# Patient Record
Sex: Female | Born: 1995 | Race: White | Hispanic: No | Marital: Single | Smoking: Never smoker
Health system: Southern US, Community
[De-identification: ages and names within clinical notes are randomized; demographics above are authoritative.]

## PROBLEM LIST (undated history)

## (undated) ENCOUNTER — Inpatient Hospital Stay (HOSPITAL_COMMUNITY): Payer: Self-pay

## (undated) ENCOUNTER — Ambulatory Visit: Admission: EM | Payer: BC Managed Care – PPO

## (undated) DIAGNOSIS — A749 Chlamydial infection, unspecified: Secondary | ICD-10-CM

## (undated) DIAGNOSIS — D649 Anemia, unspecified: Secondary | ICD-10-CM

## (undated) DIAGNOSIS — A549 Gonococcal infection, unspecified: Secondary | ICD-10-CM

## (undated) DIAGNOSIS — I10 Essential (primary) hypertension: Secondary | ICD-10-CM

## (undated) DIAGNOSIS — O139 Gestational [pregnancy-induced] hypertension without significant proteinuria, unspecified trimester: Secondary | ICD-10-CM

## (undated) HISTORY — DX: Anemia, unspecified: D64.9

## (undated) HISTORY — DX: Gonococcal infection, unspecified: A54.9

## (undated) HISTORY — DX: Chlamydial infection, unspecified: A74.9

## (undated) HISTORY — DX: Essential (primary) hypertension: I10

## (undated) HISTORY — DX: Gestational (pregnancy-induced) hypertension without significant proteinuria, unspecified trimester: O13.9

## (undated) HISTORY — PX: ECTOPIC PREGNANCY SURGERY: SHX613

## (undated) HISTORY — PX: NO PAST SURGERIES: SHX2092

---

## 2017-01-09 DIAGNOSIS — O139 Gestational [pregnancy-induced] hypertension without significant proteinuria, unspecified trimester: Secondary | ICD-10-CM

## 2017-05-12 ENCOUNTER — Emergency Department (HOSPITAL_COMMUNITY): Payer: Medicaid Other

## 2017-05-12 ENCOUNTER — Emergency Department (HOSPITAL_COMMUNITY)
Admission: EM | Admit: 2017-05-12 | Discharge: 2017-05-13 | Disposition: A | Discharge status: Home / Self Care | Payer: Medicaid Other | Attending: Emergency Medicine | Admitting: Emergency Medicine

## 2017-05-12 ENCOUNTER — Encounter (HOSPITAL_COMMUNITY): Payer: Self-pay | Admitting: Emergency Medicine

## 2017-05-12 DIAGNOSIS — N730 Acute parametritis and pelvic cellulitis: Secondary | ICD-10-CM | POA: Insufficient documentation

## 2017-05-12 DIAGNOSIS — F17290 Nicotine dependence, other tobacco product, uncomplicated: Secondary | ICD-10-CM | POA: Diagnosis not present

## 2017-05-12 DIAGNOSIS — R1030 Lower abdominal pain, unspecified: Secondary | ICD-10-CM | POA: Diagnosis present

## 2017-05-12 DIAGNOSIS — R109 Unspecified abdominal pain: Secondary | ICD-10-CM

## 2017-05-12 DIAGNOSIS — N83202 Unspecified ovarian cyst, left side: Secondary | ICD-10-CM | POA: Insufficient documentation

## 2017-05-12 LAB — COMPREHENSIVE METABOLIC PANEL
ALBUMIN: 4 g/dL (ref 3.5–5.0)
ALK PHOS: 96 U/L (ref 38–126)
ALT: 23 U/L (ref 14–54)
ANION GAP: 9 (ref 5–15)
AST: 19 U/L (ref 15–41)
BILIRUBIN TOTAL: 0.4 mg/dL (ref 0.3–1.2)
BUN: 13 mg/dL (ref 6–20)
CALCIUM: 9.2 mg/dL (ref 8.9–10.3)
CO2: 23 mmol/L (ref 22–32)
CREATININE: 0.63 mg/dL (ref 0.44–1.00)
Chloride: 104 mmol/L (ref 101–111)
GFR calc non Af Amer: >60 mL/min (ref >60)
GLUCOSE: 104 mg/dL — AB (ref 65–99)
POTASSIUM: 3.8 mmol/L (ref 3.5–5.1)
Sodium: 136 mmol/L (ref 135–145)
Total Protein: 7.9 g/dL (ref 6.5–8.1)

## 2017-05-12 LAB — WET PREP, GENITAL
CLUE CELLS WET PREP: NONE SEEN
Sperm: NONE SEEN
Trich, Wet Prep: NONE SEEN
Yeast Wet Prep HPF POC: NONE SEEN

## 2017-05-12 LAB — GC/CHLAMYDIA PROBE AMP (~~LOC~~) NOT AT ARMC
CHLAMYDIA, DNA PROBE: POSITIVE — AB
NEISSERIA GONORRHEA: NEGATIVE

## 2017-05-12 LAB — I-STAT BETA HCG BLOOD, ED (MC, WL, AP ONLY): I-stat hCG, quantitative: <5 m[IU]/mL (ref <5)

## 2017-05-12 LAB — URINALYSIS, ROUTINE W REFLEX MICROSCOPIC
Bilirubin Urine: NEGATIVE
GLUCOSE, UA: NEGATIVE mg/dL
Hgb urine dipstick: NEGATIVE
Ketones, ur: NEGATIVE mg/dL
Nitrite: NEGATIVE
PH: 5 (ref 5.0–8.0)
PROTEIN: NEGATIVE mg/dL
Specific Gravity, Urine: 1.019 (ref 1.005–1.030)

## 2017-05-12 LAB — CBC
HCT: 37.2 % (ref 36.0–46.0)
Hemoglobin: 12.5 g/dL (ref 12.0–15.0)
MCH: 28.7 pg (ref 26.0–34.0)
MCHC: 33.6 g/dL (ref 30.0–36.0)
MCV: 85.3 fL (ref 78.0–100.0)
PLATELETS: 297 10*3/uL (ref 150–400)
RBC: 4.36 MIL/uL (ref 3.87–5.11)
RDW: 12.4 % (ref 11.5–15.5)
WBC: 12.8 10*3/uL — ABNORMAL HIGH (ref 4.0–10.5)

## 2017-05-12 LAB — LIPASE, BLOOD: Lipase: 23 U/L (ref 11–51)

## 2017-05-12 MED ORDER — SODIUM CHLORIDE 0.9 % IV BOLUS (SEPSIS)
1000.0000 mL | Freq: Once | INTRAVENOUS | Status: AC
  Administered 2017-05-12: 1000 mL via INTRAVENOUS

## 2017-05-12 MED ORDER — CEFTRIAXONE SODIUM 250 MG IJ SOLR
250.0000 mg | Freq: Once | INTRAMUSCULAR | Status: AC
  Administered 2017-05-12: 250 mg via INTRAMUSCULAR
  Filled 2017-05-12: qty 250

## 2017-05-12 MED ORDER — AZITHROMYCIN 250 MG PO TABS
1000.0000 mg | ORAL_TABLET | Freq: Once | ORAL | Status: AC
  Administered 2017-05-12: 1000 mg via ORAL
  Filled 2017-05-12: qty 4

## 2017-05-12 MED ORDER — DOXYCYCLINE HYCLATE 100 MG PO CAPS: 100.0000 mg | ORAL_CAPSULE | Freq: Two times a day (BID) | ORAL | 0 refills | Status: DC

## 2017-05-12 MED ORDER — OXYCODONE-ACETAMINOPHEN 5-325 MG PO TABS
1.0000 | ORAL_TABLET | Freq: Once | ORAL | Status: AC
  Administered 2017-05-12: 1 via ORAL
  Filled 2017-05-12: qty 1

## 2017-05-12 NOTE — ED Triage Notes (Signed)
Pt presents to ED for assessment of lower right and left abdominal pain, diarrhea and general malaise for 3 days.  Several small children in the house, as well as diarrhea spreading amongst the adults this week.  Pt had a recent pregnancy 4 months ago.  Pt denies vomiting, but c/o intermittent nausea (denies at this time).  Pt tachycardic at triage.

## 2017-05-12 NOTE — ED Provider Notes (Signed)
[facility] EMERGENCY DEPARTMENT Provider Note   CSN: [id number] Arrival date & time: 05/12/17  0017     History   Chief Complaint Chief Complaint  Patient presents with  . Abdominal Pain  . Diarrhea    HPI Carla Rose is a 21 y.o. female.  HPI  This is a 21 year old female who presents with reported history of worsening lower abdominal pain. Patient reports lower abdominal cramping and discomfort. Currently she rates her pain 8 out of 10. It sometimes lateralizes to the right. She has history of UTIs. She denies dysuria or hematuria. She does endorse diarrhea. No nausea or vomiting. No recent fevers. When asked about concerns for STDs, patient states that "I know my boyfriend has chlamydia." She denies any vaginal discharge or symptoms.  History reviewed. No pertinent past medical history.  There are no active problems to display for this patient.   History reviewed. No pertinent surgical history.  OB History    No data available       Home Medications    Prior to Admission medications   Medication Sig Start Date End Date Taking? Authorizing Provider  doxycycline (VIBRAMYCIN) 100 MG capsule Take 1 capsule (100 mg total) by mouth 2 (two) times daily. 05/12/17   Horton, Mayer Masker, MD    Family History History reviewed. No pertinent family history.  Social History Social History  Substance Use Topics  . Smoking status: Current Every Day Smoker    Packs/day: 5.00    Types: Cigars  . Smokeless tobacco: Never Used  . Alcohol use No     Allergies   Patient has no known allergies.   Review of Systems Review of Systems  Constitutional: Negative for fever.  Respiratory: Negative for shortness of breath.   Cardiovascular: Negative for chest pain.  Gastrointestinal: Positive for abdominal pain and diarrhea. Negative for nausea and vomiting.  Genitourinary: Negative for dysuria and vaginal discharge.  Musculoskeletal: Negative for back  pain.  All other systems reviewed and are negative.    Physical Exam Updated Vital Signs BP 109/71   Pulse (!) 109   Temp 99.2 F (37.3 C) (Oral)   Resp 20   Ht  (1.727 m)   Wt 72.6 kg (160 lb)   SpO2 98%   BMI 24.33 kg/m   Physical Exam  Constitutional: She is oriented to person, place, and time. She appears well-developed and well-nourished. No distress.  HENT:  Head: Normocephalic and atraumatic.  Cardiovascular: Regular rhythm and normal heart sounds.   tachycardia  Pulmonary/Chest: Effort normal. No respiratory distress. She has no wheezes.  Abdominal: Soft. Bowel sounds are normal. She exhibits no mass. There is tenderness. There is no guarding.  Tenderness palpation mostly right lower quadrant and suprapubic area, no rebound or guarding  Genitourinary: Vagina normal.  Genitourinary Comments: No cervical friability noted, there is copious vaginal discharge and tenderness to palpation over the right adnexa  Neurological: She is alert and oriented to person, place, and time.  Skin: Skin is warm and dry.  Psychiatric: She has a normal mood and affect.  Nursing note and vitals reviewed.    ED Treatments / Results  Labs (all labs ordered are listed, but only abnormal results are displayed) Labs Reviewed  WET PREP, GENITAL - Abnormal; Notable for the following:       Result Value   WBC, Wet Prep HPF POC MANY (*)    All other components within normal limits  COMPREHENSIVE METABOLIC PANEL - Abnormal;  Notable for the following:    Glucose, Bld 104 (*)    All other components within normal limits  CBC - Abnormal; Notable for the following:    WBC 12.8 (*)    All other components within normal limits  URINALYSIS, ROUTINE W REFLEX MICROSCOPIC - Abnormal; Notable for the following:    APPearance CLOUDY (*)    Leukocytes, UA LARGE (*)    Bacteria, UA RARE (*)    Squamous Epithelial / LPF 6-30 (*)    Non Squamous Epithelial 0-5 (*)    All other components within  normal limits  URINE CULTURE  LIPASE, BLOOD  I-STAT BETA HCG BLOOD, ED ([facility], [facility], [facility] ONLY)  GC/CHLAMYDIA PROBE AMP (Adrian) NOT AT [facility]    EKG  EKG Interpretation  Date/Time:  Tuesday May 12 2017 00:27:00 EDT Ventricular Rate:  137 PR Interval:  104 QRS Duration: 76 QT Interval:  372 QTC Calculation: 561 R Axis:   105 Text Interpretation:  Critical Test Result: Long QTc Sinus tachycardia with short PR Rightward axis Nonspecific T wave abnormality Abnormal ECG No prior for comparison Confirmed by Ross Marcus ([id number]) on 05/12/2017 3:44:44 AM       Radiology US Pelvic Doppler (torsion R/o Or Mass Arterial Flow)  Result Date: 05/12/2017 CLINICAL DATA:  Initial evaluation for acute pelvic pain for 3 days. EXAM: TRANSABDOMINAL AND TRANSVAGINAL ULTRASOUND OF PELVIS DOPPLER ULTRASOUND OF OVARIES TECHNIQUE: Both transabdominal and transvaginal ultrasound examinations of the pelvis were performed. Transabdominal technique was performed for global imaging of the pelvis including uterus, ovaries, adnexal regions, and pelvic cul-de-sac. It was necessary to proceed with endovaginal exam following the transabdominal exam to visualize the uterus and ovaries. Color and duplex Doppler ultrasound was utilized to evaluate blood flow to the ovaries. COMPARISON:  None. FINDINGS: Uterus Measurements: 8.1 x 4.9 x 5.3 cm. No fibroids or other mass visualized. Endometrium Thickness: 5.1 mm.  No focal abnormality visualized. Right ovary Measurements: 3.8 x 2.1 x 2.6 cm. Normal appearance/no adnexal mass. Left ovary Measurements: 5.6 x 4.1 x 5.8 cm. Mildly complex cyst measuring 4.2 x 3.2 x 4.6 cm, most likely a hemorrhagic cyst or possibly a cyst with internal debris. No internal vascularity or concerning features. Pulsed Doppler evaluation of both ovaries demonstrates normal low-resistance arterial and venous waveforms. Other findings Small volume free fluid within the pelvis. IMPRESSION: 1. 4.2 x 3.2 x  4.6 cm complex left ovarian cyst, which may reflect a hemorrhagic cyst or cyst with internal debris parent while this is normal survey benign, a short interval follow-up study in 6-12 weeks to ensure resolution is suggested. 2. Associated small volume free fluid within the pelvis. 3. Otherwise unremarkable pelvic ultrasound. No evidence for ovarian torsion. Electronically Signed   By: Rise Mu M.D.   On: 05/12/2017 06:47   US Pelvic Complete With Transvaginal  Result Date: 05/12/2017 CLINICAL DATA:  Initial evaluation for acute pelvic pain for 3 days. EXAM: TRANSABDOMINAL AND TRANSVAGINAL ULTRASOUND OF PELVIS DOPPLER ULTRASOUND OF OVARIES TECHNIQUE: Both transabdominal and transvaginal ultrasound examinations of the pelvis were performed. Transabdominal technique was performed for global imaging of the pelvis including uterus, ovaries, adnexal regions, and pelvic cul-de-sac. It was necessary to proceed with endovaginal exam following the transabdominal exam to visualize the uterus and ovaries. Color and duplex Doppler ultrasound was utilized to evaluate blood flow to the ovaries. COMPARISON:  None. FINDINGS: Uterus Measurements: 8.1 x 4.9 x 5.3 cm. No fibroids or other mass visualized. Endometrium Thickness: 5.1 mm.  No focal abnormality visualized. Right ovary Measurements: 3.8 x 2.1 x 2.6 cm. Normal appearance/no adnexal mass. Left ovary Measurements: 5.6 x 4.1 x 5.8 cm. Mildly complex cyst measuring 4.2 x 3.2 x 4.6 cm, most likely a hemorrhagic cyst or possibly a cyst with internal debris. No internal vascularity or concerning features. Pulsed Doppler evaluation of both ovaries demonstrates normal low-resistance arterial and venous waveforms. Other findings Small volume free fluid within the pelvis. IMPRESSION: 1. 4.2 x 3.2 x 4.6 cm complex left ovarian cyst, which may reflect a hemorrhagic cyst or cyst with internal debris parent while this is normal survey benign, a short interval follow-up  study in 6-12 weeks to ensure resolution is suggested. 2. Associated small volume free fluid within the pelvis. 3. Otherwise unremarkable pelvic ultrasound. No evidence for ovarian torsion. Electronically Signed   By: Rise Mu M.D.   On: 05/12/2017 06:47    Procedures Procedures (including critical care time)  Medications Ordered in ED Medications  oxyCODONE-acetaminophen (PERCOCET/ROXICET) 5-325 MG per tablet 1 tablet (1 tablet Oral Given 05/12/17 0413)  cefTRIAXone (ROCEPHIN) injection 250 mg (250 mg Intramuscular Given 05/12/17 0413)  azithromycin (ZITHROMAX) tablet 1,000 mg (1,000 mg Oral Given 05/12/17 0413)  sodium chloride 0.9 % bolus 1,000 mL (0 mLs Intravenous Stopped 05/12/17 0534)     Initial Impression / Assessment and Plan / ED Course  I have reviewed the triage vital signs and the nursing notes.  Pertinent labs & imaging results that were available during my care of the patient were reviewed by me and considered in my medical decision making (see chart for details).     Patient presents with several days of lower abdominal pain and diarrhea. She also reports no exposure to chlamydia but denies any vaginal discharge. She has copious discharge on exam and right adnexal tenderness. Pelvic ultrasound was obtained. Basic labwork was obtained and is otherwise fairly reassuring. Mild leukocytosis. Patient was tested and treated for STDs. She was also given fluids. Initial heart rate was in the 110s. Patient is unsure what her baseline is. She is afebrile. Pelvic ultrasound shows no evidence of torsion. She does have a left ovarian cyst. This is on the opposite side of her pain. No evidence of tubo-ovarian abscess. Would elect to treat for PID. Symptoms are not consistent with appendicitis and have low suspicion at this time for that. Will not proceed with further imaging. Will discharge home with doxycycline.  After history, exam, and medical workup I feel the patient has  been appropriately medically screened and is safe for discharge home. Pertinent diagnoses were discussed with the patient. Patient was given return precautions.    Final Clinical Impressions(s) / ED Diagnoses   Final diagnoses:  Abdominal pain  PID (acute pelvic inflammatory disease)  Left ovarian cyst    New Prescriptions New Prescriptions   DOXYCYCLINE (VIBRAMYCIN) 100 MG CAPSULE    Take 1 capsule (100 mg total) by mouth 2 (two) times daily.     Shon Baton, MD 05/12/17 [phone]

## 2017-05-12 NOTE — Discharge Instructions (Signed)
You were seen today for abdominal pain. You're tested and treated for STDs and will be treated for pelvic inflammatory disease. Abstain from sexual activity for the next 10 days. Make sure to always use protection. During her workup you're found to have a left ovarian cyst. You need to repeat ultrasound in 6-12 weeks to evaluate for resolution. If you develop worsening pain or any new worsening symptoms you need to be reevaluated.

## 2017-05-13 LAB — URINE CULTURE

## 2017-07-28 NOTE — L&D Delivery Note (Signed)
[name] is a 22 y.o. female [phone] with IUP at 4650w1d admitted for spontaneous onset of labor.  She progressed without augmentation to complete and pushed 1 time to deliver.  Cord clamping delayed by several minutes then clamped by CNM and cut by FOB.    Delivery Note At 12:10 AM a viable female was delivered via Vaginal, Spontaneous (Presentation: LOA).  APGAR: 8, 9; weight pending.   Placenta status: spontaneous, intact.  Cord: 3 vessels  Anesthesia:  epidural Episiotomy: None Lacerations: None Suture Repair: n/a Est. Blood Loss (mL): 100  Mom to postpartum.  Baby to Couplet care / Skin to Skin.  Carla Rose CNM 07/19/2018, 12:17 AM

## 2017-12-16 LAB — OB RESULTS CONSOLE ABO/RH: RH Type: POSITIVE

## 2017-12-16 LAB — OB RESULTS CONSOLE GC/CHLAMYDIA
CHLAMYDIA, DNA PROBE: POSITIVE
Gonorrhea: NEGATIVE

## 2017-12-16 LAB — OB RESULTS CONSOLE RUBELLA ANTIBODY, IGM: Rubella: IMMUNE

## 2017-12-16 LAB — OB RESULTS CONSOLE ANTIBODY SCREEN: ANTIBODY SCREEN: NEGATIVE

## 2017-12-16 LAB — OB RESULTS CONSOLE RPR: RPR: NONREACTIVE

## 2018-01-13 LAB — OB RESULTS CONSOLE HIV ANTIBODY (ROUTINE TESTING): HIV: NONREACTIVE

## 2018-01-13 LAB — OB RESULTS CONSOLE HEPATITIS B SURFACE ANTIGEN: Hepatitis B Surface Ag: NEGATIVE

## 2018-01-31 ENCOUNTER — Emergency Department (HOSPITAL_COMMUNITY)
Admission: EM | Admit: 2018-01-31 | Discharge: 2018-01-31 | Disposition: A | Discharge status: Home / Self Care | Payer: Medicaid Other | Attending: Emergency Medicine | Admitting: Emergency Medicine

## 2018-01-31 ENCOUNTER — Encounter (HOSPITAL_COMMUNITY): Payer: Self-pay | Admitting: Emergency Medicine

## 2018-01-31 ENCOUNTER — Other Ambulatory Visit: Payer: Self-pay

## 2018-01-31 DIAGNOSIS — Z3A20 20 weeks gestation of pregnancy: Secondary | ICD-10-CM | POA: Diagnosis not present

## 2018-01-31 DIAGNOSIS — Z87891 Personal history of nicotine dependence: Secondary | ICD-10-CM | POA: Diagnosis not present

## 2018-01-31 DIAGNOSIS — N3000 Acute cystitis without hematuria: Secondary | ICD-10-CM

## 2018-01-31 DIAGNOSIS — O2342 Unspecified infection of urinary tract in pregnancy, second trimester: Secondary | ICD-10-CM | POA: Diagnosis present

## 2018-01-31 LAB — URINALYSIS, ROUTINE W REFLEX MICROSCOPIC
Bilirubin Urine: NEGATIVE
Glucose, UA: NEGATIVE mg/dL
Hgb urine dipstick: NEGATIVE
Ketones, ur: NEGATIVE mg/dL
NITRITE: NEGATIVE
Protein, ur: 30 mg/dL — AB
SPECIFIC GRAVITY, URINE: 1.03 (ref 1.005–1.030)
pH: 5 (ref 5.0–8.0)

## 2018-01-31 MED ORDER — NITROFURANTOIN MONOHYD MACRO 100 MG PO CAPS
100.0000 mg | ORAL_CAPSULE | Freq: Once | ORAL | Status: AC
  Administered 2018-01-31: 100 mg via ORAL
  Filled 2018-01-31: qty 1

## 2018-01-31 MED ORDER — NITROFURANTOIN MONOHYD MACRO 100 MG PO CAPS: 100.0000 mg | ORAL_CAPSULE | Freq: Two times a day (BID) | ORAL | 0 refills | Status: DC

## 2018-01-31 NOTE — Discharge Instructions (Addendum)
Follow up with an OB/GYN doctor, Dr. Emelda FearFerguson and [facility].  Return as needed for worsening symptoms

## 2018-01-31 NOTE — ED Triage Notes (Signed)
Lower abd pain that radiates to rt hip x 1 month.  Pt is 3 months preg.  Denies any vaginal bleeding.  States pain is worse when she moves. Reports she was recently tx for chlamydia.

## 2018-01-31 NOTE — ED Provider Notes (Signed)
[facility] EMERGENCY DEPARTMENT Provider Note   CSN: [id number] Arrival date & time: 01/31/18  1332     History   Chief Complaint Chief Complaint  Patient presents with  . Abdominal Pain    HPI Carla Rose is a 22 y.o. female.  HPI Patient is G3, P2 at [redacted] weeks EGA who presents with intermittent lower abdominal pain for the last month.  Patient states she has had some pain in her right lower quadrant.  The symptoms come and go.  She was diagnosed with an intrauterine pregnancy when [place]  Patient states she recently moved to this area she does not have an OB doctor.  She was concerned she might be having issues with possible urinary tract infection.  She denies any nausea vomiting.  No vaginal discharge or bleeding. History reviewed. No pertinent past medical history.  There are no active problems to display for this patient.   History reviewed. No pertinent surgical history.   OB History   None      Home Medications    Prior to Admission medications   Medication Sig Start Date End Date Taking? Authorizing Provider  nitrofurantoin, macrocrystal-monohydrate, (MACROBID) 100 MG capsule Take 1 capsule (100 mg total) by mouth 2 (two) times daily. 01/31/18   Linwood DibblesKnapp, Crixus Mcaulay, MD    Family History No family history on file.  Social History Social History   Tobacco Use  . Smoking status: Former Smoker    Packs/day: 5.00    Types: Cigars  . Smokeless tobacco: Never Used  Substance Use Topics  . Alcohol use: No  . Drug use: No     Allergies   Patient has no known allergies.   Review of Systems Review of Systems  All other systems reviewed and are negative.    Physical Exam Updated Vital Signs BP 97/70 (BP Location: Left Arm)   Pulse 93   Temp 98.3 F (36.8 C) (Oral)   Resp 16   Ht 1.727 m (5\' 8" )   Wt 75.8 kg (167 lb)   SpO2 99%   BMI 25.39 kg/m   Physical Exam  Constitutional: She appears well-developed and  well-nourished. No distress.  HENT:  Head: Normocephalic and atraumatic.  Right Ear: External ear normal.  Left Ear: External ear normal.  Eyes: Conjunctivae are normal. Right eye exhibits no discharge. Left eye exhibits no discharge. No scleral icterus.  Neck: Neck supple. No tracheal deviation present.  Cardiovascular: Normal rate, regular rhythm and intact distal pulses.  Pulmonary/Chest: Effort normal and breath sounds normal. No stridor. No respiratory distress. She has no wheezes. She has no rales.  Abdominal: Soft. Bowel sounds are normal. She exhibits no distension. There is no tenderness. There is no rebound and no guarding.  Genitourinary: Pelvic exam was performed with patient supine. There is no rash, tenderness, lesion or injury on the right labia. There is no rash, tenderness, lesion or injury on the left labia. Uterus is enlarged. Uterus is not tender. Cervix exhibits no motion tenderness, no discharge and no friability. Right adnexum displays no mass, no tenderness and no fullness. Left adnexum displays no mass, no tenderness and no fullness. No erythema or tenderness in the vagina. No foreign body in the vagina. No signs of injury around the vagina. No vaginal discharge found.  Musculoskeletal: She exhibits no edema or tenderness.  Neurological: She is alert. She has normal strength. No cranial nerve deficit (no facial droop, extraocular movements intact, no slurred speech)  or sensory deficit. She exhibits normal muscle tone. She displays no seizure activity. Coordination normal.  Skin: Skin is warm and dry. No rash noted.  Psychiatric: She has a normal mood and affect.  Nursing note and vitals reviewed.    ED Treatments / Results  Labs (all labs ordered are listed, but only abnormal results are displayed) Labs Reviewed  URINALYSIS, ROUTINE W REFLEX MICROSCOPIC - Abnormal; Notable for the following components:      Result Value   Color, Urine AMBER (*)    APPearance CLOUDY  (*)    Protein, ur 30 (*)    Leukocytes, UA SMALL (*)    Bacteria, UA RARE (*)    All other components within normal limits  URINE CULTURE  RPR  HIV ANTIBODY (ROUTINE TESTING)  GC/CHLAMYDIA PROBE AMP (Henriette) NOT AT [facility]    EKG None  Radiology No results found.  Procedures Procedures (including critical care time)  Medications Ordered in ED Medications  nitrofurantoin (macrocrystal-monohydrate) (MACROBID) capsule 100 mg (has no administration in time range)     Initial Impression / Assessment and Plan / ED Course  I have reviewed the triage vital signs and the nursing notes.  Pertinent labs & imaging results that were available during my care of the patient were reviewed by me and considered in my medical decision making (see chart for details).  Clinical Course as of Jan 31 1541  Wynelle Link Jan 31, 2018  1430 Records reviewed from her visit at [facility].  Patient had emergency room visit on May 22.  Patient had a confirmed IUP by ultrasound.   LMP 09/13/2017 (exact date   [JK]  1536 Repeat heart rate normal.  Pt was not tachycardic on my exam.  ?accuracy of initial heart rate   [JK]    Clinical Course User Index [JK] Linwood Dibbles, MD    Patient presented to the emergency room for evaluation of intermittent right lower quadrant abdominal pain.  Patient had no abdominal tenderness on exam.  She has not had any vaginal bleeding.  Patient is G3, P2 at 20 weeks.  Patient had normal fetal heart tones.   Pelvic exam normal.  UA does suggest uti.  Will dc home on oral abx.  Outpatient follow up with OB GYN  Final Clinical Impressions(s) / ED Diagnoses   Final diagnoses:  Acute cystitis without hematuria    ED Discharge Orders        Ordered    nitrofurantoin, macrocrystal-monohydrate, (MACROBID) 100 MG capsule  2 times daily     01/31/18 1537       Linwood Dibbles, MD 01/31/18 1542

## 2018-02-01 LAB — GC/CHLAMYDIA PROBE AMP (~~LOC~~) NOT AT ARMC
Chlamydia: POSITIVE — AB
Neisseria Gonorrhea: NEGATIVE

## 2018-02-01 LAB — HIV ANTIBODY (ROUTINE TESTING W REFLEX): HIV Screen 4th Generation wRfx: NONREACTIVE

## 2018-02-01 LAB — RPR: RPR Ser Ql: NONREACTIVE

## 2018-02-02 LAB — URINE CULTURE

## 2018-02-23 ENCOUNTER — Encounter: Payer: Self-pay | Admitting: *Deleted

## 2018-02-25 ENCOUNTER — Ambulatory Visit (INDEPENDENT_AMBULATORY_CARE_PROVIDER_SITE_OTHER): Payer: Self-pay | Admitting: Women's Health

## 2018-02-25 ENCOUNTER — Encounter: Payer: Self-pay | Admitting: Women's Health

## 2018-02-25 ENCOUNTER — Other Ambulatory Visit (HOSPITAL_COMMUNITY)
Admission: RE | Admit: 2018-02-25 | Discharge: 2018-02-25 | Disposition: A | Discharge status: Home / Self Care | Payer: Medicaid Other | Source: Ambulatory Visit | Attending: Obstetrics & Gynecology | Admitting: Obstetrics & Gynecology

## 2018-02-25 VITALS — BP 114/70 | HR 109 | Wt 174.0 lb

## 2018-02-25 DIAGNOSIS — Z124 Encounter for screening for malignant neoplasm of cervix: Secondary | ICD-10-CM | POA: Diagnosis not present

## 2018-02-25 DIAGNOSIS — Z3482 Encounter for supervision of other normal pregnancy, second trimester: Secondary | ICD-10-CM

## 2018-02-25 DIAGNOSIS — Z3A18 18 weeks gestation of pregnancy: Secondary | ICD-10-CM | POA: Insufficient documentation

## 2018-02-25 DIAGNOSIS — Z8619 Personal history of other infectious and parasitic diseases: Secondary | ICD-10-CM | POA: Insufficient documentation

## 2018-02-25 DIAGNOSIS — Z363 Encounter for antenatal screening for malformations: Secondary | ICD-10-CM

## 2018-02-25 DIAGNOSIS — Z1389 Encounter for screening for other disorder: Secondary | ICD-10-CM

## 2018-02-25 DIAGNOSIS — A749 Chlamydial infection, unspecified: Secondary | ICD-10-CM

## 2018-02-25 DIAGNOSIS — Z3490 Encounter for supervision of normal pregnancy, unspecified, unspecified trimester: Secondary | ICD-10-CM | POA: Insufficient documentation

## 2018-02-25 DIAGNOSIS — Z8759 Personal history of other complications of pregnancy, childbirth and the puerperium: Secondary | ICD-10-CM

## 2018-02-25 DIAGNOSIS — Z331 Pregnant state, incidental: Secondary | ICD-10-CM

## 2018-02-25 LAB — POCT URINALYSIS DIPSTICK OB
Blood, UA: NEGATIVE
Glucose, UA: NEGATIVE — AB
Ketones, UA: NEGATIVE
NITRITE UA: NEGATIVE
PROTEIN: NEGATIVE

## 2018-02-25 NOTE — Progress Notes (Signed)
INITIAL OBSTETRICAL VISIT Patient name: Carla Rose MRN [id number]  Date of birth: 04/06/1996 Chief Complaint:   Initial Prenatal Visit  History of Present Illness:   Carla FontCayenne Bordonaro is a 22 y.o. 863P2002 Caucasian female at 4567w4d by 8wk u/s, with an Estimated Date of Delivery: 07/25/18 being seen today for her initial obstetrical visit w/ us, she is transferring from [place], d/t recent move.   Her obstetrical history is significant for term uncomplicated SVB x 2, GHTN 2nd pregnancy (was new FOB0.  CT+ earlier this pregnancy, again in July at ED, was tx, due for POC today.  Today she reports no complaints. +FM No LMP recorded. Patient is pregnant. Last pap never. Results were: n/a Review of Systems:   Pertinent items are noted in HPI Denies cramping/contractions, leakage of fluid, vaginal bleeding, abnormal vaginal discharge w/ itching/odor/irritation, headaches, visual changes, shortness of breath, chest pain, abdominal pain, severe nausea/vomiting, or problems with urination or bowel movements unless otherwise stated above.  Pertinent History Reviewed:  Reviewed past medical,surgical, social, obstetrical and family history.  Reviewed problem list, medications and allergies. OB History  Gravida Para Term Preterm AB Living  3 2 2     2   SAB TAB Ectopic Multiple Live Births          2    # Outcome Date GA Lbr Len/2nd Weight Sex Delivery Anes PTL Lv  3 Current           2 Term 01/09/17 5187w0d  7 lb 4 oz (3.289 kg) F Vag-Spont EPI N LIV     Complications: Gestational hypertension  1 Term 01/17/14 7236w0d  7 lb 3 oz (3.26 kg) M Vag-Spont EPI N LIV   Physical Assessment:   Vitals:   02/25/18 1612  BP: 114/70  Pulse: (!) 109  Weight: 174 lb (78.9 kg)  Body mass index is 26.46 kg/m.       Physical Examination:  General appearance - well appearing, and in no distress  Mental status - alert, oriented to person, place, and time  Psych:  She has a normal mood and affect  Skin - warm  and dry, normal color, no suspicious lesions noted  Chest - effort normal, all lung fields clear to auscultation bilaterally  Heart - normal rate and regular rhythm  Abdomen - soft, nontender  Extremities:  No swelling or varicosities noted  Pelvic - VULVA: normal appearing vulva with no masses, tenderness or lesions  VAGINA: normal appearing vagina with normal color and discharge, no lesions  CERVIX: normal appearing cervix without discharge or lesions, no CMT  Thin prep pap is done w/ reflex HR HPV cotesting  Fetal Heart Rate (bpm): 160 via doppler  Results for orders placed or performed in visit on 02/25/18 (from the past 24 hour(s))  POC Urinalysis Dipstick OB   Collection Time: 02/25/18  4:21 PM  Result Value Ref Range   Color, UA     Clarity, UA     Glucose, UA Negative (A) (none)   Bilirubin, UA     Ketones, UA neg    Spec Grav, UA  1.010 - 1.025   Blood, UA neg    pH, UA  5.0 - 8.0   POC Protein UA Negative Negative, Trace   Urobilinogen, UA  0.2 or 1.0 E.U./dL   Nitrite, UA neg    Leukocytes, UA Moderate (2+) (A) Negative   Appearance     Odor      Assessment & Plan:  1) Low-Risk  Pregnancy G3P2002 at [redacted]w[redacted]d with an Estimated Date of Delivery: 07/25/18   2) Initial OB visit  3) Recent +CT> POC today  4) H/O GHTN> baseline pre-e labs today, begin baby ASA 162mg  daily  Meds: No orders of the defined types were placed in this encounter.   Initial labs obtained Continue prenatal vitamins Reviewed n/v relief measures and warning s/s to report Reviewed recommended weight gain based on pre-gravid BMI Encouraged well-balanced diet Genetic Screening discussed: declined Cystic fibrosis screening discussed declined Ultrasound discussed; fetal survey: requested CCNC completed  Follow-up: Return for 2wks for LROB and anatomy u/s.   Orders Placed This Encounter  Procedures  . US OB Comp + 14 Wk  . Pain Management Screening Profile (10S)  . CBC  . Comprehensive  metabolic panel  . Protein / creatinine ratio, urine  . POC Urinalysis Dipstick OB    Cheral Marker CNM, [facility] 02/25/2018 5:13 PM

## 2018-02-25 NOTE — Patient Instructions (Addendum)
Carla Rose, I greatly value your feedback.  If you receive a survey following your visit with us today, we appreciate you taking the time to fill it out.  Thanks, Joellyn HaffKim Loa Idler, CNM, WHNP-BC  Begin taking 162mg  (two 81mg  tablets) baby aspirin daily to decrease risk of preeclampsia during pregnancy    Second Trimester of Pregnancy The second trimester is from week 14 through week 27 (months 4 through 6). The second trimester is often a time when you feel your best. Your body has adjusted to being pregnant, and you begin to feel better physically. Usually, morning sickness has lessened or quit completely, you may have more energy, and you may have an increase in appetite. The second trimester is also a time when the fetus is growing rapidly. At the end of the sixth month, the fetus is about 9 inches long and weighs about 1 pounds. You will likely begin to feel the baby move (quickening) between 16 and 20 weeks of pregnancy. Body changes during your second trimester Your body continues to go through many changes during your second trimester. The changes vary from woman to woman.  Your weight will continue to increase. You will notice your lower abdomen bulging out.  You may begin to get stretch marks on your hips, abdomen, and breasts.  You may develop headaches that can be relieved by medicines. The medicines should be approved by your health care provider.  You may urinate more often because the fetus is pressing on your bladder.  You may develop or continue to have heartburn as a result of your pregnancy.  You may develop constipation because certain hormones are causing the muscles that push waste through your intestines to slow down.  You may develop hemorrhoids or swollen, bulging veins (varicose veins).  You may have back pain. This is caused by: ? Weight gain. ? Pregnancy hormones that are relaxing the joints in your pelvis. ? A shift in weight and the muscles that support your  balance.  Your breasts will continue to grow and they will continue to become tender.  Your gums may bleed and may be sensitive to brushing and flossing.  Dark spots or blotches (chloasma, mask of pregnancy) may develop on your face. This will likely fade after the baby is born.  A dark line from your belly button to the pubic area (linea nigra) may appear. This will likely fade after the baby is born.  You may have changes in your hair. These can include thickening of your hair, rapid growth, and changes in texture. Some women also have hair loss during or after pregnancy, or hair that feels dry or thin. Your hair will most likely return to normal after your baby is born.  What to expect at prenatal visits During a routine prenatal visit:  You will be weighed to make sure you and the fetus are growing normally.  Your blood pressure will be taken.  Your abdomen will be measured to track your baby's growth.  The fetal heartbeat will be listened to.  Any test results from the previous visit will be discussed.  Your health care provider may ask you:  How you are feeling.  If you are feeling the baby move.  If you have had any abnormal symptoms, such as leaking fluid, bleeding, severe headaches, or abdominal cramping.  If you are using any tobacco products, including cigarettes, chewing tobacco, and electronic cigarettes.  If you have any questions.  Other tests that may be performed during  your second trimester include:  Blood tests that check for: ? Low iron levels (anemia). ? High blood sugar that affects pregnant women (gestational diabetes) between 4124 and 28 weeks. ? Rh antibodies. This is to check for a protein on red blood cells (Rh factor).  Urine tests to check for infections, diabetes, or protein in the urine.  An ultrasound to confirm the proper growth and development of the baby.  An amniocentesis to check for possible genetic problems.  Fetal screens for  spina bifida and Down syndrome.  HIV (human immunodeficiency virus) testing. Routine prenatal testing includes screening for HIV, unless you choose not to have this test.  Follow these instructions at home: Medicines  Follow your health care provider's instructions regarding medicine use. Specific medicines may be either safe or unsafe to take during pregnancy.  Take a prenatal vitamin that contains at least 600 micrograms (mcg) of folic acid.  If you develop constipation, try taking a stool softener if your health care provider approves. Eating and drinking  Eat a balanced diet that includes fresh fruits and vegetables, whole grains, good sources of protein such as meat, eggs, or tofu, and low-fat dairy. Your health care provider will help you determine the amount of weight gain that is right for you.  Avoid raw meat and uncooked cheese. These carry germs that can cause birth defects in the baby.  If you have low calcium intake from food, talk to your health care provider about whether you should take a daily calcium supplement.  Limit foods that are high in fat and processed sugars, such as fried and sweet foods.  To prevent constipation: ? Drink enough fluid to keep your urine clear or pale yellow. ? Eat foods that are high in fiber, such as fresh fruits and vegetables, whole grains, and beans. Activity  Exercise only as directed by your health care provider. Most women can continue their usual exercise routine during pregnancy. Try to exercise for 30 minutes at least 5 days a week. Stop exercising if you experience uterine contractions.  Avoid heavy lifting, wear low heel shoes, and practice good posture.  A sexual relationship may be continued unless your health care provider directs you otherwise. Relieving pain and discomfort  Wear a good support bra to prevent discomfort from breast tenderness.  Take warm sitz baths to soothe any pain or discomfort caused by hemorrhoids.  Use hemorrhoid cream if your health care provider approves.  Rest with your legs elevated if you have leg cramps or low back pain.  If you develop varicose veins, wear support hose. Elevate your feet for 15 minutes, 3-4 times a day. Limit salt in your diet. Prenatal Care  Write down your questions. Take them to your prenatal visits.  Keep all your prenatal visits as told by your health care provider. This is important. Safety  Wear your seat belt at all times when driving.  Make a list of emergency phone numbers, including numbers for family, friends, the hospital, and police and fire departments. General instructions  Ask your health care provider for a referral to a local prenatal education class. Begin classes no later than the beginning of month 6 of your pregnancy.  Ask for help if you have counseling or nutritional needs during pregnancy. Your health care provider can offer advice or refer you to specialists for help with various needs.  Do not use hot tubs, steam rooms, or saunas.  Do not douche or use tampons or scented sanitary pads.  Do not cross your legs for long periods of time.  Avoid cat litter boxes and soil used by cats. These carry germs that can cause birth defects in the baby and possibly loss of the fetus by miscarriage or stillbirth.  Avoid all smoking, herbs, alcohol, and unprescribed drugs. Chemicals in these products can affect the formation and growth of the baby.  Do not use any products that contain nicotine or tobacco, such as cigarettes and e-cigarettes. If you need help quitting, ask your health care provider.  Visit your dentist if you have not gone yet during your pregnancy. Use a soft toothbrush to brush your teeth and be gentle when you floss. Contact a health care provider if:  You have dizziness.  You have mild pelvic cramps, pelvic pressure, or nagging pain in the abdominal area.  You have persistent nausea, vomiting, or diarrhea.  You  have a bad smelling vaginal discharge.  You have pain when you urinate. Get help right away if:  You have a fever.  You are leaking fluid from your vagina.  You have spotting or bleeding from your vagina.  You have severe abdominal cramping or pain.  You have rapid weight gain or weight loss.  You have shortness of breath with chest pain.  You notice sudden or extreme swelling of your face, hands, ankles, feet, or legs.  You have not felt your baby move in over an hour.  You have severe headaches that do not go away when you take medicine.  You have vision changes. Summary  The second trimester is from week 14 through week 27 (months 4 through 6). It is also a time when the fetus is growing rapidly.  Your body goes through many changes during pregnancy. The changes vary from woman to woman.  Avoid all smoking, herbs, alcohol, and unprescribed drugs. These chemicals affect the formation and growth your baby.  Do not use any tobacco products, such as cigarettes, chewing tobacco, and e-cigarettes. If you need help quitting, ask your health care provider.  Contact your health care provider if you have any questions. Keep all prenatal visits as told by your health care provider. This is important. This information is not intended to replace advice given to you by your health care provider. Make sure you discuss any questions you have with your health care provider. Document Released: 07/08/2001 Document Revised: 12/20/2015 Document Reviewed: 09/14/2012 Elsevier Interactive Patient Education  2017 [employer]

## 2018-02-26 LAB — CBC
Hematocrit: 32.4 % — ABNORMAL LOW (ref 34.0–46.6)
Hemoglobin: 11 g/dL — ABNORMAL LOW (ref 11.1–15.9)
MCH: 29.1 pg (ref 26.6–33.0)
MCHC: 34 g/dL (ref 31.5–35.7)
MCV: 86 fL (ref 79–97)
Platelets: 255 10*3/uL (ref 150–450)
RBC: 3.78 x10E6/uL (ref 3.77–5.28)
RDW: 15.3 % (ref 12.3–15.4)
WBC: 8.8 10*3/uL (ref 3.4–10.8)

## 2018-02-26 LAB — COMPREHENSIVE METABOLIC PANEL
ALK PHOS: 102 IU/L (ref 39–117)
ALT: 13 IU/L (ref 0–32)
AST: 11 IU/L (ref 0–40)
Albumin/Globulin Ratio: 1.5 (ref 1.2–2.2)
Albumin: 3.8 g/dL (ref 3.5–5.5)
BUN/Creatinine Ratio: 11 (ref 9–23)
BUN: 6 mg/dL (ref 6–20)
CHLORIDE: 104 mmol/L (ref 96–106)
CO2: 20 mmol/L (ref 20–29)
Calcium: 8.6 mg/dL — ABNORMAL LOW (ref 8.7–10.2)
Creatinine, Ser: 0.54 mg/dL — ABNORMAL LOW (ref 0.57–1.00)
GFR calc Af Amer: 156 mL/min/{1.73_m2} (ref >59)
GFR calc non Af Amer: 135 mL/min/{1.73_m2} (ref >59)
Globulin, Total: 2.5 g/dL (ref 1.5–4.5)
Glucose: 83 mg/dL (ref 65–99)
POTASSIUM: 3.9 mmol/L (ref 3.5–5.2)
Sodium: 137 mmol/L (ref 134–144)
TOTAL PROTEIN: 6.3 g/dL (ref 6.0–8.5)

## 2018-02-26 LAB — PMP SCREEN PROFILE (10S), URINE
Amphetamine Scrn, Ur: NEGATIVE ng/mL
BARBITURATE SCREEN URINE: NEGATIVE ng/mL
BENZODIAZEPINE SCREEN, URINE: NEGATIVE ng/mL
CANNABINOIDS UR QL SCN: NEGATIVE ng/mL
Cocaine (Metab) Scrn, Ur: NEGATIVE ng/mL
Creatinine(Crt), U: 115.1 mg/dL (ref 20.0–300.0)
Methadone Screen, Urine: NEGATIVE ng/mL
OXYCODONE+OXYMORPHONE UR QL SCN: NEGATIVE ng/mL
Opiate Scrn, Ur: NEGATIVE ng/mL
PH UR, DRUG SCRN: 6.6 (ref 4.5–8.9)
Phencyclidine Qn, Ur: NEGATIVE ng/mL
Propoxyphene Scrn, Ur: NEGATIVE ng/mL

## 2018-02-26 LAB — PROTEIN / CREATININE RATIO, URINE
Creatinine, Urine: 121.2 mg/dL
Protein, Ur: 12.4 mg/dL
Protein/Creat Ratio: 102 mg/g creat (ref 0–200)

## 2018-03-02 LAB — CYTOLOGY - PAP
CHLAMYDIA, DNA PROBE: NEGATIVE
Diagnosis: NEGATIVE
Neisseria Gonorrhea: NEGATIVE

## 2018-03-11 ENCOUNTER — Ambulatory Visit (INDEPENDENT_AMBULATORY_CARE_PROVIDER_SITE_OTHER): Payer: Medicaid Other | Admitting: Obstetrics and Gynecology

## 2018-03-11 ENCOUNTER — Ambulatory Visit (INDEPENDENT_AMBULATORY_CARE_PROVIDER_SITE_OTHER): Payer: Medicaid Other

## 2018-03-11 VITALS — BP 111/69 | HR 95 | Wt 175.8 lb

## 2018-03-11 DIAGNOSIS — Z3A20 20 weeks gestation of pregnancy: Secondary | ICD-10-CM

## 2018-03-11 DIAGNOSIS — Z3482 Encounter for supervision of other normal pregnancy, second trimester: Secondary | ICD-10-CM

## 2018-03-11 DIAGNOSIS — Z1389 Encounter for screening for other disorder: Secondary | ICD-10-CM

## 2018-03-11 DIAGNOSIS — Z363 Encounter for antenatal screening for malformations: Secondary | ICD-10-CM | POA: Diagnosis not present

## 2018-03-11 DIAGNOSIS — Z331 Pregnant state, incidental: Secondary | ICD-10-CM

## 2018-03-11 LAB — POCT URINALYSIS DIPSTICK OB
Blood, UA: NEGATIVE
Glucose, UA: NEGATIVE — AB
Ketones, UA: NEGATIVE
LEUKOCYTES UA: NEGATIVE
NITRITE UA: NEGATIVE
PROTEIN: NEGATIVE

## 2018-03-11 NOTE — Progress Notes (Signed)
US 20+4 wks,breech,cx 3 cm,posterior placenta gr 0,normal ovaries bilat,svp of fluid 5 cm,EFW 400 g 73%,anatomy complete,no obvious abnormalities

## 2018-03-11 NOTE — Progress Notes (Signed)
Patient ID: Carla Rose, female   DOB: 12/04/1995, 22 y.o.   MRN: [id number]    LOW-RISK PREGNANCY VISIT Patient name: Carla FontCayenne Kaigler MRN [id number]  Date of birth: 01/28/1996 Chief Complaint:   Routine Prenatal Visit (US today)  History of Present Illness:   Carla FontCayenne Bralley is a 22 y.o. 723P2002 female at 3670w4d with an Estimated Date of Delivery: 07/25/18 being seen today for ongoing management of a low-risk pregnancy.  Today she reports no complaints. H/o GHTN for which she takes baby ASA 162mg  daily. [facility] and plans to use it again after this child. The health department is here to see her today.   Contractions: Not present. Vag. Bleeding: None.  Movement: Present. denies leaking of fluid. Review of Systems:   Pertinent items are noted in HPI Denies abnormal vaginal discharge w/ itching/odor/irritation, headaches, visual changes, shortness of breath, chest pain, abdominal pain, severe nausea/vomiting, or problems with urination or bowel movements unless otherwise stated above. Pertinent History Reviewed:  Reviewed past medical,surgical, social, obstetrical and family history.  Reviewed problem list, medications and allergies. Physical Assessment:   Vitals:   03/11/18 1135  BP: 111/69  Pulse: 95  Weight: 175 lb 12.8 oz (79.7 kg)  Body mass index is 26.73 kg/m.        Physical Examination:   General appearance: Well appearing, and in no distress  Mental status: Alert, oriented to person, place, and time  Skin: Warm & dry  Cardiovascular: Normal heart rate noted  Respiratory: Normal respiratory effort, no distress  Abdomen: Soft, gravid, nontender  Pelvic: Cervical exam deferred         Extremities: Edema: None  Fetal Status:   Fundal Height: 22 cm Movement: Present    Results for orders placed or performed in visit on 03/11/18 (from the past 24 hour(s))  POC Urinalysis Dipstick OB   Collection Time: 03/11/18 11:36 AM  Result Value Ref  Range   Color, UA     Clarity, UA     Glucose, UA Negative (A) (none)   Bilirubin, UA     Ketones, UA neg    Spec Grav, UA     Blood, UA neg    pH, UA     POC Protein UA Negative Negative, Trace   Urobilinogen, UA     Nitrite, UA neg    Leukocytes, UA Negative Negative   Appearance     Odor      Assessment & Plan:  1) Low-risk pregnancy G3P2002 at 10870w4d with an Estimated Date of Delivery: 07/25/18   2) h/o GHTN, takes baby ASA 162mg  daily   Meds: No orders of the defined types were placed in this encounter.  Labs/procedures today: US 20+4 wks,breech,cx 3 cm,posterior placenta gr 0,normal ovaries bilat,svp of fluid 5 cm,EFW 400 g 73%,anatomy complete,no obvious abnormalities   Plan:  Continue routine obstetrical care  Follow-up: Return in about 4 weeks (around 04/08/2018) for LROB.  Orders Placed This Encounter  Procedures  . POC Urinalysis Dipstick OB   By signing my name below, I, Pietro CassisEmily Tufford, attest that this documentation has been prepared under the direction and in the presence of Tilda BurrowFerguson, Kenzleigh Sedam V, MD. Electronically Signed: Pietro CassisEmily Tufford, Medical Scribe. 03/11/18. 11:52 AM.  I personally performed the services described in this documentation, which was SCRIBED in my presence. The recorded information has been reviewed and considered accurate. It has been edited as necessary during review. Tilda BurrowJohn V Pinchos Topel, MD

## 2018-03-27 ENCOUNTER — Inpatient Hospital Stay (HOSPITAL_COMMUNITY)
Admission: AD | Admit: 2018-03-27 | Discharge: 2018-03-28 | Disposition: A | Discharge status: Home / Self Care | Payer: Medicaid Other | Source: Ambulatory Visit | Attending: Obstetrics and Gynecology | Admitting: Obstetrics and Gynecology

## 2018-03-27 ENCOUNTER — Encounter (HOSPITAL_COMMUNITY): Payer: Self-pay

## 2018-03-27 DIAGNOSIS — G43019 Migraine without aura, intractable, without status migrainosus: Secondary | ICD-10-CM | POA: Diagnosis not present

## 2018-03-27 DIAGNOSIS — B9689 Other specified bacterial agents as the cause of diseases classified elsewhere: Secondary | ICD-10-CM | POA: Diagnosis present

## 2018-03-27 DIAGNOSIS — Z3482 Encounter for supervision of other normal pregnancy, second trimester: Secondary | ICD-10-CM

## 2018-03-27 DIAGNOSIS — N760 Acute vaginitis: Secondary | ICD-10-CM

## 2018-03-27 DIAGNOSIS — O9989 Other specified diseases and conditions complicating pregnancy, childbirth and the puerperium: Secondary | ICD-10-CM | POA: Diagnosis not present

## 2018-03-27 DIAGNOSIS — O99352 Diseases of the nervous system complicating pregnancy, second trimester: Secondary | ICD-10-CM | POA: Diagnosis not present

## 2018-03-27 DIAGNOSIS — O99891 Other specified diseases and conditions complicating pregnancy: Secondary | ICD-10-CM

## 2018-03-27 DIAGNOSIS — O26892 Other specified pregnancy related conditions, second trimester: Secondary | ICD-10-CM | POA: Diagnosis present

## 2018-03-27 DIAGNOSIS — M549 Dorsalgia, unspecified: Secondary | ICD-10-CM | POA: Diagnosis not present

## 2018-03-27 DIAGNOSIS — Z3A22 22 weeks gestation of pregnancy: Secondary | ICD-10-CM | POA: Insufficient documentation

## 2018-03-27 DIAGNOSIS — O23592 Infection of other part of genital tract in pregnancy, second trimester: Secondary | ICD-10-CM | POA: Diagnosis not present

## 2018-03-27 DIAGNOSIS — G43909 Migraine, unspecified, not intractable, without status migrainosus: Secondary | ICD-10-CM | POA: Diagnosis present

## 2018-03-27 NOTE — MAU Provider Note (Addendum)
History     CSN: [id number]  Arrival date and time: 03/27/18 2313   First Provider Initiated Contact with Patient 03/27/18 2354      Chief Complaint  Patient presents with  . Back Pain   HPI  [name] is a 22 y.o. year old G21P2002 female at [redacted]w[redacted]d weeks gestation who presents to MAU reporting waking up this morning "burning up", lower back pain that radiates to her RT rib, migraine/light-headed, and some yellow vaginal d/c. She denies VB or LOF.  Past Medical History:  Diagnosis Date  . Anemia   . Chlamydia   . Gonorrhea   . Hypertension   . Pregnancy induced hypertension     Past Surgical History:  Procedure Laterality Date  . NO PAST SURGERIES      Family History  Problem Relation Age of Onset  . Crohn's disease Father   . Crohn's disease Sister   . Heart disease Paternal Grandmother     Social History   Tobacco Use  . Smoking status: Former Smoker    Packs/day: 5.00    Types: Cigars  . Smokeless tobacco: Never Used  Substance Use Topics  . Alcohol use: No  . Drug use: No    Allergies: No Known Allergies  Medications Prior to Admission  Medication Sig Dispense Refill Last Dose  . acetaminophen (TYLENOL) 325 MG tablet Take 650 mg by mouth every 6 (six) hours as needed.   03/27/2018 at Unknown time  . aspirin 81 MG chewable tablet Chew 162 mg by mouth daily.   Past Week at Unknown time  . flintstones complete (FLINTSTONES) 60 MG chewable tablet Chew 2 tablets by mouth daily.   Past Week at Unknown time    Review of Systems  Constitutional: Positive for appetite change, diaphoresis and fever.  HENT: Negative.   Eyes: Negative.   Respiratory: Negative.   Cardiovascular: Negative.   Gastrointestinal: Positive for nausea.  Endocrine: Negative.   Genitourinary: Positive for vaginal discharge (yellow).  Musculoskeletal: Positive for back pain.  Skin: Negative.   Allergic/Immunologic: Negative.   Neurological: Positive for light-headedness.   Hematological: Negative.   Psychiatric/Behavioral: Negative.    Physical Exam   Patient Vitals for the past 24 hrs:  BP Temp Temp src Pulse Resp SpO2 Height Weight  03/28/18 0202 - 98.2 F (36.8 C) Oral - - - - -  03/28/18 0135 - 100.3 F (37.9 C) Oral - - - - -  03/28/18 0120 - 100 F (37.8 C) Oral - - - - -  03/28/18 0044 - (!) 100.8 F (38.2 C) Oral (!) 139 - - - -  03/27/18 2326 120/75 (!) 102.2 F (39 C) - (!) 150 (!) 25 100 % 5\' 8"  (1.727 m) 80.7 kg     Physical Exam  Nursing note and vitals reviewed. Constitutional: She is oriented to person, place, and time. She appears well-developed and well-nourished.  HENT:  Head: Normocephalic and atraumatic.  Right Ear: External ear normal.  Left Ear: External ear normal.  Nose: Nose normal.  Mouth/Throat: Oropharynx is clear and moist.  Eyes: Pupils are equal, round, and reactive to light. Conjunctivae and EOM are normal.  Neck: Normal range of motion. Neck supple.  Cardiovascular: Regular rhythm, normal heart sounds and intact distal pulses. Tachycardia present.  Respiratory: Effort normal and breath sounds normal.  GI: Soft. Bowel sounds are normal.  Genitourinary:  Genitourinary Comments: Pelvic deferred  Musculoskeletal: Normal range of motion.  Neurological: She is alert and  oriented to person, place, and time. She has normal reflexes.  Skin: Skin is warm. She is diaphoretic.  Hot to the touch  Psychiatric: She has a normal mood and affect. Her behavior is normal. Judgment and thought content normal.    MAU Course  Procedures  MDM CCUA UCx Wet Prep Tylenol 1000 mg -- improved temp down to WNL Flexeril 10 mg -- improved back pain and H/A a little LR 1000 ml bolus  FHTs by doppler: 170 bpm  Results for orders placed or performed during the hospital encounter of 03/27/18 (from the past 24 hour(s))  Urinalysis, Routine w reflex microscopic     Status: Abnormal   Collection Time: 03/27/18 11:32 PM  Result  Value Ref Range   Color, Urine YELLOW YELLOW   APPearance CLEAR CLEAR   Specific Gravity, Urine 1.012 1.005 - 1.030   pH 7.0 5.0 - 8.0   Glucose, UA NEGATIVE NEGATIVE mg/dL   Hgb urine dipstick NEGATIVE NEGATIVE   Bilirubin Urine NEGATIVE NEGATIVE   Ketones, ur NEGATIVE NEGATIVE mg/dL   Protein, ur NEGATIVE NEGATIVE mg/dL   Nitrite NEGATIVE NEGATIVE   Leukocytes, UA MODERATE (A) NEGATIVE   RBC / HPF 0-5 0 - 5 RBC/hpf   WBC, UA 11-20 0 - 5 WBC/hpf   Bacteria, UA RARE (A) NONE SEEN   Squamous Epithelial / LPF 0-5 0 - 5   Mucus PRESENT   CBC with Differential/Platelet     Status: Abnormal   Collection Time: 03/28/18 12:35 AM  Result Value Ref Range   WBC 9.0 4.0 - 10.5 K/uL   RBC 3.67 (L) 3.87 - 5.11 MIL/uL   Hemoglobin 11.1 (L) 12.0 - 15.0 g/dL   HCT 60.432.0 (L) 54.036.0 - 98.146.0 %   MCV 87.2 78.0 - 100.0 fL   MCH 30.2 26.0 - 34.0 pg   MCHC 34.7 30.0 - 36.0 g/dL   RDW 19.114.2 47.811.5 - 29.515.5 %   Platelets 185 150 - 400 K/uL   Neutrophils Relative % 86 %   Neutro Abs 7.8 (H) 1.7 - 7.7 K/uL   Lymphocytes Relative 10 %   Lymphs Abs 0.9 0.7 - 4.0 K/uL   Monocytes Relative 4 %   Monocytes Absolute 0.3 0.1 - 1.0 K/uL   Eosinophils Relative 0 %   Eosinophils Absolute 0.0 0.0 - 0.7 K/uL   Basophils Relative 0 %   Basophils Absolute 0.0 0.0 - 0.1 K/uL  Comprehensive metabolic panel     Status: Abnormal   Collection Time: 03/28/18 12:35 AM  Result Value Ref Range   Sodium 130 (L) 135 - 145 mmol/L   Potassium 3.2 (L) 3.5 - 5.1 mmol/L   Chloride 100 98 - 111 mmol/L   CO2 19 (L) 22 - 32 mmol/L   Glucose, Bld 86 70 - 99 mg/dL   BUN 6 6 - 20 mg/dL   Creatinine, Ser 6.210.43 (L) 0.44 - 1.00 mg/dL   Calcium 8.3 (L) 8.9 - 10.3 mg/dL   Total Protein 6.9 6.5 - 8.1 g/dL   Albumin 3.3 (L) 3.5 - 5.0 g/dL   AST 20 15 - 41 U/L   ALT 15 0 - 44 U/L   Alkaline Phosphatase 133 (H) 38 - 126 U/L   Total Bilirubin 0.6 0.3 - 1.2 mg/dL   GFR calc non Af Amer >60 >60 mL/min   GFR calc Af Amer >60 >60 mL/min   Anion  gap 11 5 - 15  Wet prep, genital     Status: Abnormal  Collection Time: 03/28/18 12:58 AM  Result Value Ref Range   Yeast Wet Prep HPF POC NONE SEEN NONE SEEN   Trich, Wet Prep NONE SEEN NONE SEEN   Clue Cells Wet Prep HPF POC PRESENT (A) NONE SEEN   WBC, Wet Prep HPF POC MANY (A) NONE SEEN   Sperm NONE SEEN     Assessment and Plan  Back pain affecting pregnancy in second trimester  - Use Flexeril prn - Information provided on back pain in pregnancy  Intractable migraine without aura and without status migrainosus  - Rx for Flexeril 10 mg prn H/A - Information provided on migraine  Bacterial vaginitis - Rx for Flagyl 500 mg BID x 7 days - Information provided on BV   - Discharge patient - Keep scheduled appt with FT in 4 wks - Patient verbalized an understanding of the plan of care and agrees.   Raelyn Mora, MSN, CNM 03/28/2018, 11:54 PM

## 2018-03-27 NOTE — MAU Note (Signed)
Woke up this am with lower back pain that comes around to R rib. Migraine and light-headed. Some yellow vag d/c but denies LOF.

## 2018-03-28 DIAGNOSIS — M549 Dorsalgia, unspecified: Secondary | ICD-10-CM

## 2018-03-28 DIAGNOSIS — G43909 Migraine, unspecified, not intractable, without status migrainosus: Secondary | ICD-10-CM | POA: Diagnosis present

## 2018-03-28 DIAGNOSIS — B9689 Other specified bacterial agents as the cause of diseases classified elsewhere: Secondary | ICD-10-CM | POA: Diagnosis present

## 2018-03-28 DIAGNOSIS — N760 Acute vaginitis: Secondary | ICD-10-CM

## 2018-03-28 DIAGNOSIS — O9989 Other specified diseases and conditions complicating pregnancy, childbirth and the puerperium: Secondary | ICD-10-CM

## 2018-03-28 DIAGNOSIS — O99891 Other specified diseases and conditions complicating pregnancy: Secondary | ICD-10-CM

## 2018-03-28 LAB — URINALYSIS, ROUTINE W REFLEX MICROSCOPIC
Bilirubin Urine: NEGATIVE
Glucose, UA: NEGATIVE mg/dL
Hgb urine dipstick: NEGATIVE
KETONES UR: NEGATIVE mg/dL
Nitrite: NEGATIVE
PROTEIN: NEGATIVE mg/dL
SPECIFIC GRAVITY, URINE: 1.012 (ref 1.005–1.030)
pH: 7 (ref 5.0–8.0)

## 2018-03-28 LAB — COMPREHENSIVE METABOLIC PANEL
ALBUMIN: 3.3 g/dL — AB (ref 3.5–5.0)
ALK PHOS: 133 U/L — AB (ref 38–126)
ALT: 15 U/L (ref 0–44)
AST: 20 U/L (ref 15–41)
Anion gap: 11 (ref 5–15)
BUN: 6 mg/dL (ref 6–20)
CALCIUM: 8.3 mg/dL — AB (ref 8.9–10.3)
CHLORIDE: 100 mmol/L (ref 98–111)
CO2: 19 mmol/L — AB (ref 22–32)
CREATININE: 0.43 mg/dL — AB (ref 0.44–1.00)
GFR calc Af Amer: >60 mL/min (ref >60)
GFR calc non Af Amer: >60 mL/min (ref >60)
GLUCOSE: 86 mg/dL (ref 70–99)
Potassium: 3.2 mmol/L — ABNORMAL LOW (ref 3.5–5.1)
Sodium: 130 mmol/L — ABNORMAL LOW (ref 135–145)
Total Bilirubin: 0.6 mg/dL (ref 0.3–1.2)
Total Protein: 6.9 g/dL (ref 6.5–8.1)

## 2018-03-28 LAB — CBC WITH DIFFERENTIAL/PLATELET
BASOS ABS: 0 10*3/uL (ref 0.0–0.1)
BASOS PCT: 0 %
EOS ABS: 0 10*3/uL (ref 0.0–0.7)
EOS PCT: 0 %
HCT: 32 % — ABNORMAL LOW (ref 36.0–46.0)
HEMOGLOBIN: 11.1 g/dL — AB (ref 12.0–15.0)
LYMPHS ABS: 0.9 10*3/uL (ref 0.7–4.0)
Lymphocytes Relative: 10 %
MCH: 30.2 pg (ref 26.0–34.0)
MCHC: 34.7 g/dL (ref 30.0–36.0)
MCV: 87.2 fL (ref 78.0–100.0)
Monocytes Absolute: 0.3 10*3/uL (ref 0.1–1.0)
Monocytes Relative: 4 %
NEUTROS PCT: 86 %
Neutro Abs: 7.8 10*3/uL — ABNORMAL HIGH (ref 1.7–7.7)
PLATELETS: 185 10*3/uL (ref 150–400)
RBC: 3.67 MIL/uL — ABNORMAL LOW (ref 3.87–5.11)
RDW: 14.2 % (ref 11.5–15.5)
WBC: 9 10*3/uL (ref 4.0–10.5)

## 2018-03-28 LAB — WET PREP, GENITAL
SPERM: NONE SEEN
Trich, Wet Prep: NONE SEEN
Yeast Wet Prep HPF POC: NONE SEEN

## 2018-03-28 MED ORDER — ACETAMINOPHEN 500 MG PO TABS
1000.0000 mg | ORAL_TABLET | Freq: Once | ORAL | Status: AC
  Administered 2018-03-28: 1000 mg via ORAL
  Filled 2018-03-28: qty 2

## 2018-03-28 MED ORDER — CYCLOBENZAPRINE HCL 10 MG PO TABS
10.0000 mg | ORAL_TABLET | Freq: Once | ORAL | Status: AC
  Administered 2018-03-28: 10 mg via ORAL
  Filled 2018-03-28: qty 1

## 2018-03-28 MED ORDER — LACTATED RINGERS IV BOLUS
1000.0000 mL | Freq: Once | INTRAVENOUS | Status: AC
  Administered 2018-03-28: 1000 mL via INTRAVENOUS

## 2018-03-28 MED ORDER — CYCLOBENZAPRINE HCL 10 MG PO TABS: 10.0000 mg | ORAL_TABLET | Freq: Two times a day (BID) | ORAL | 0 refills | Status: DC | PRN

## 2018-03-28 MED ORDER — METRONIDAZOLE 500 MG PO TABS: 500.0000 mg | ORAL_TABLET | Freq: Two times a day (BID) | ORAL | 0 refills | Status: AC

## 2018-03-28 NOTE — MAU Note (Deleted)
Pt. States ctx started at 2300. Reports that are every 5 minutes. Denies leaking or bleeding.

## 2018-03-29 LAB — CULTURE, OB URINE: Special Requests: NORMAL

## 2018-04-08 ENCOUNTER — Encounter: Payer: Self-pay | Admitting: Women's Health

## 2018-04-08 ENCOUNTER — Ambulatory Visit (INDEPENDENT_AMBULATORY_CARE_PROVIDER_SITE_OTHER): Payer: Medicaid Other | Admitting: Women's Health

## 2018-04-08 VITALS — BP 123/78 | HR 125 | Wt 180.0 lb

## 2018-04-08 DIAGNOSIS — Z331 Pregnant state, incidental: Secondary | ICD-10-CM

## 2018-04-08 DIAGNOSIS — Z1389 Encounter for screening for other disorder: Secondary | ICD-10-CM

## 2018-04-08 DIAGNOSIS — Z3A24 24 weeks gestation of pregnancy: Secondary | ICD-10-CM

## 2018-04-08 DIAGNOSIS — Z3482 Encounter for supervision of other normal pregnancy, second trimester: Secondary | ICD-10-CM

## 2018-04-08 LAB — POCT URINALYSIS DIPSTICK OB
GLUCOSE, UA: NEGATIVE
KETONES UA: NEGATIVE
Leukocytes, UA: NEGATIVE
Nitrite, UA: NEGATIVE
POC,PROTEIN,UA: NEGATIVE
RBC UA: NEGATIVE

## 2018-04-08 NOTE — Patient Instructions (Signed)
Carla Rose, I greatly value your feedback.  If you receive a survey following your visit with us today, we appreciate you taking the time to fill it out.  Thanks, Joellyn HaffKim Nevin Grizzle, CNM, WHNP-BC   You will have your sugar test next visit.  Please do not eat or drink anything after midnight the night before you come, not even water.  You will be here for at least two hours.     Call the office [phone]) or go to [facility] if:  You begin to have strong, frequent contractions  Your water breaks.  Sometimes it is a big gush of fluid, sometimes it is just a trickle that keeps getting your panties wet or running down your legs  You have vaginal bleeding.  It is normal to have a small amount of spotting if your cervix was checked.   You don't feel your baby moving like normal.  If you don't, get you something to eat and drink and lay down and focus on feeling your baby move.   If your baby is still not moving like normal, you should call the office or go to [facility].  Second Trimester of Pregnancy The second trimester is from week 13 through week 28, months 4 through 6. The second trimester is often a time when you feel your best. Your body has also adjusted to being pregnant, and you begin to feel better physically. Usually, morning sickness has lessened or quit completely, you may have more energy, and you may have an increase in appetite. The second trimester is also a time when the fetus is growing rapidly. At the end of the sixth month, the fetus is about 9 inches long and weighs about 1 pounds. You will likely begin to feel the baby move (quickening) between 18 and 20 weeks of the pregnancy. BODY CHANGES Your body goes through many changes during pregnancy. The changes vary from woman to woman.   Your weight will continue to increase. You will notice your lower abdomen bulging out.  You may begin to get stretch marks on your hips, abdomen, and breasts.  You may develop  headaches that can be relieved by medicines approved by your health care provider.  You may urinate more often because the fetus is pressing on your bladder.  You may develop or continue to have heartburn as a result of your pregnancy.  You may develop constipation because certain hormones are causing the muscles that push waste through your intestines to slow down.  You may develop hemorrhoids or swollen, bulging veins (varicose veins).  You may have back pain because of the weight gain and pregnancy hormones relaxing your joints between the bones in your pelvis and as a result of a shift in weight and the muscles that support your balance.  Your breasts will continue to grow and be tender.  Your gums may bleed and may be sensitive to brushing and flossing.  Dark spots or blotches (chloasma, mask of pregnancy) may develop on your face. This will likely fade after the baby is born.  A dark line from your belly button to the pubic area (linea nigra) may appear. This will likely fade after the baby is born.  You may have changes in your hair. These can include thickening of your hair, rapid growth, and changes in texture. Some women also have hair loss during or after pregnancy, or hair that feels dry or thin. Your hair will most likely return to normal after your baby  is born. WHAT TO EXPECT AT YOUR PRENATAL VISITS During a routine prenatal visit:  You will be weighed to make sure you and the fetus are growing normally.  Your blood pressure will be taken.  Your abdomen will be measured to track your baby's growth.  The fetal heartbeat will be listened to.  Any test results from the previous visit will be discussed. Your health care provider may ask you:  How you are feeling.  If you are feeling the baby move.  If you have had any abnormal symptoms, such as leaking fluid, bleeding, severe headaches, or abdominal cramping.  If you have any questions. Other tests that may be  performed during your second trimester include:  Blood tests that check for:  Low iron levels (anemia).  Gestational diabetes (between 24 and 28 weeks).  Rh antibodies.  Urine tests to check for infections, diabetes, or protein in the urine.  An ultrasound to confirm the proper growth and development of the baby.  An amniocentesis to check for possible genetic problems.  Fetal screens for spina bifida and Down syndrome. HOME CARE INSTRUCTIONS   Avoid all smoking, herbs, alcohol, and unprescribed drugs. These chemicals affect the formation and growth of the baby.  Follow your health care provider's instructions regarding medicine use. There are medicines that are either safe or unsafe to take during pregnancy.  Exercise only as directed by your health care provider. Experiencing uterine cramps is a good sign to stop exercising.  Continue to eat regular, healthy meals.  Wear a good support bra for breast tenderness.  Do not use hot tubs, steam rooms, or saunas.  Wear your seat belt at all times when driving.  Avoid raw meat, uncooked cheese, cat litter boxes, and soil used by cats. These carry germs that can cause birth defects in the baby.  Take your prenatal vitamins.  Try taking a stool softener (if your health care provider approves) if you develop constipation. Eat more high-fiber foods, such as fresh vegetables or fruit and whole grains. Drink plenty of fluids to keep your urine clear or pale yellow.  Take warm sitz baths to soothe any pain or discomfort caused by hemorrhoids. Use hemorrhoid cream if your health care provider approves.  If you develop varicose veins, wear support hose. Elevate your feet for 15 minutes, 3-4 times a day. Limit salt in your diet.  Avoid heavy lifting, wear low heel shoes, and practice good posture.  Rest with your legs elevated if you have leg cramps or low back pain.  Visit your dentist if you have not gone yet during your pregnancy.  Use a soft toothbrush to brush your teeth and be gentle when you floss.  A sexual relationship may be continued unless your health care provider directs you otherwise.  Continue to go to all your prenatal visits as directed by your health care provider. SEEK MEDICAL CARE IF:   You have dizziness.  You have mild pelvic cramps, pelvic pressure, or nagging pain in the abdominal area.  You have persistent nausea, vomiting, or diarrhea.  You have a bad smelling vaginal discharge.  You have pain with urination. SEEK IMMEDIATE MEDICAL CARE IF:   You have a fever.  You are leaking fluid from your vagina.  You have spotting or bleeding from your vagina.  You have severe abdominal cramping or pain.  You have rapid weight gain or loss.  You have shortness of breath with chest pain.  You notice sudden or extreme swelling  of your face, hands, ankles, feet, or legs.  You have not felt your baby move in over an hour.  You have severe headaches that do not go away with medicine.  You have vision changes. Document Released: 07/08/2001 Document Revised: 07/19/2013 Document Reviewed: 09/14/2012 [facility] Patient Information 2015 [place], [place]. This information is not intended to replace advice given to you by your health care provider. Make sure you discuss any questions you have with your health care provider.     PROTECT YOURSELF & YOUR BABY FROM THE FLU! Because you are pregnant, we at [facility], along with the Centers for Disease Control (CDC), recommend that you receive the flu vaccine to protect yourself and your baby from the flu. The flu is more likely to cause severe illness in pregnant women than in women of reproductive age who are not pregnant. Changes in the immune system, heart, and lungs during pregnancy make pregnant women (and women up to two weeks postpartum) more prone to severe illness from flu, including illness resulting in hospitalization. Flu also may be harmful for a  pregnant woman's developing baby. A common flu symptom is fever, which may be associated with neural tube defects and other adverse outcomes for a developing baby. Getting vaccinated can also help protect a baby after birth from flu. (Mom passes antibodies onto the developing baby during her pregnancy.)  A Flu Vaccine is the Best Protection Against Flu Getting a flu vaccine is the first and most important step in protecting against flu. Pregnant women should get a flu shot and not the live attenuated influenza vaccine (LAIV), also known as nasal spray flu vaccine. Flu vaccines given during pregnancy help protect both the mother and her baby from flu. Vaccination has been shown to reduce the risk of flu-associated acute respiratory infection in pregnant women by up to one-half. A 2018 study showed that getting a flu shot reduced a pregnant woman's risk of being hospitalized with flu by an average of 40 percent. Pregnant women who get a flu vaccine are also helping to protect their babies from flu illness for the first several months after their birth, when they are too young to get vaccinated.   A Long Record of Safety for Flu Shots in Pregnant Women Flu shots have been given to millions of pregnant women over many years with a good safety record. There is a lot of evidence that flu vaccines can be given safely during pregnancy; though these data are limited for the first trimester. The CDC recommends that pregnant women get vaccinated during any trimester of their pregnancy. It is very important for pregnant women to get the flu shot.   Other Preventive Actions In addition to getting a flu shot, pregnant women should take the same everyday preventive actions the CDC recommends of everyone, including covering coughs, washing hands often, and avoiding people who are sick.  Symptoms and Treatment If you get sick with flu symptoms call your doctor right away. There are antiviral drugs that can treat flu  illness and prevent serious flu complications. The CDC recommends prompt treatment for people who have influenza infection or suspected influenza infection and who are at high risk of serious flu complications, such as people with asthma, diabetes (including gestational diabetes), or heart disease. Early treatment of influenza in hospitalized pregnant women has been shown to reduce the length of the hospital stay.  Symptoms Flu symptoms include fever, cough, sore throat, runny or stuffy nose, body aches, headache, chills and fatigue.  Some people may also have vomiting and diarrhea. People may be infected with the flu and have respiratory symptoms without a fever.  Early Treatment is Important for Pregnant Women Treatment should begin as soon as possible because antiviral drugs work best when started early (within 48 hours after symptoms start). Antiviral drugs can make your flu illness milder and make you feel better faster. They may also prevent serious health problems that can result from flu illness. Oral oseltamivir (Tamiflu) is the preferred treatment for pregnant women because it has the most studies available to suggest that it is safe and beneficial. Antiviral drugs require a prescription from your provider. Having a fever caused by flu infection or other infections early in pregnancy may be linked to birth defects in a baby. In addition to taking antiviral drugs, pregnant women who get a fever should treat their fever with Tylenol (acetaminophen) and contact their provider immediately.  When to Seek Emergency Medical Care If you are pregnant and have any of these signs, seek care immediately:  Difficulty breathing or shortness of breath  Pain or pressure in the chest or abdomen  Sudden dizziness  Confusion  Severe or persistent vomiting  High fever that is not responding to Tylenol (or store brand equivalent)  Decreased or no movement of your  baby  [email or web address]

## 2018-04-08 NOTE — Progress Notes (Signed)
   LOW-RISK PREGNANCY VISIT Patient name: Carla Rose MRN [id number]  Date of birth: 02/14/1996 Chief Complaint:   Routine Prenatal Visit  History of Present Illness:   Carla Rose is a 22 y.o. 463P2002 female at 5922w4d with an Estimated Date of Delivery: 07/25/18 being seen today for ongoing management of a low-risk pregnancy.  Today she reports no complaints. Contractions: Not present. Vag. Bleeding: None.  Movement: Present. denies leaking of fluid. Review of Systems:   Pertinent items are noted in HPI Denies abnormal vaginal discharge w/ itching/odor/irritation, headaches, visual changes, shortness of breath, chest pain, abdominal pain, severe nausea/vomiting, or problems with urination or bowel movements unless otherwise stated above. Pertinent History Reviewed:  Reviewed past medical,surgical, social, obstetrical and family history.  Reviewed problem list, medications and allergies. Physical Assessment:   Vitals:   04/08/18 1057  BP: 123/78  Pulse: (!) 125  Weight: 180 lb (81.6 kg)  Body mass index is 27.37 kg/m.        Physical Examination:   General appearance: Well appearing, and in no distress  Mental status: Alert, oriented to person, place, and time  Skin: Warm & dry  Cardiovascular: Normal heart rate noted  Respiratory: Normal respiratory effort, no distress  Abdomen: Soft, gravid, nontender  Pelvic: Cervical exam deferred         Extremities: Edema: None  Fetal Status: Fetal Heart Rate (bpm): 140 Fundal Height: 24 cm Movement: Present    Results for orders placed or performed in visit on 04/08/18 (from the past 24 hour(s))  POC Urinalysis Dipstick OB   Collection Time: 04/08/18 10:58 AM  Result Value Ref Range   Color, UA     Clarity, UA     Glucose, UA Negative Negative   Bilirubin, UA     Ketones, UA neg    Spec Grav, UA     Blood, UA neg    pH, UA     POC Protein UA Negative Negative, Trace   Urobilinogen, UA     Nitrite, UA neg    Leukocytes,  UA Negative Negative   Appearance     Odor      Assessment & Plan:  1) Low-risk pregnancy G3P2002 at 1622w4d with an Estimated Date of Delivery: 07/25/18    Meds: No orders of the defined types were placed in this encounter.  Labs/procedures today: declined flu shot today- wants at next visit  Plan:  Continue routine obstetrical care   Reviewed: Preterm labor symptoms and general obstetric precautions including but not limited to vaginal bleeding, contractions, leaking of fluid and fetal movement were reviewed in detail with the patient.  All questions were answered  Follow-up: Return in about 4 weeks (around 05/06/2018) for LROB, PN2 and flu/tdap shots.  Orders Placed This Encounter  Procedures  . POC Urinalysis Dipstick OB   Cheral MarkerKimberly R Booker CNM, [facility] 04/08/2018 11:20 AM

## 2018-05-06 ENCOUNTER — Ambulatory Visit (INDEPENDENT_AMBULATORY_CARE_PROVIDER_SITE_OTHER): Payer: Medicaid Other | Admitting: Obstetrics and Gynecology

## 2018-05-06 ENCOUNTER — Other Ambulatory Visit: Payer: Medicaid Other

## 2018-05-06 VITALS — BP 109/67 | HR 109 | Wt 185.6 lb

## 2018-05-06 DIAGNOSIS — Z3A28 28 weeks gestation of pregnancy: Secondary | ICD-10-CM

## 2018-05-06 DIAGNOSIS — Z131 Encounter for screening for diabetes mellitus: Secondary | ICD-10-CM

## 2018-05-06 DIAGNOSIS — Z331 Pregnant state, incidental: Secondary | ICD-10-CM

## 2018-05-06 DIAGNOSIS — Z3483 Encounter for supervision of other normal pregnancy, third trimester: Secondary | ICD-10-CM

## 2018-05-06 DIAGNOSIS — Z1389 Encounter for screening for other disorder: Secondary | ICD-10-CM

## 2018-05-06 LAB — POCT URINALYSIS DIPSTICK
Glucose, UA: NEGATIVE
Ketones, UA: NEGATIVE
Leukocytes, UA: NEGATIVE
NITRITE UA: NEGATIVE
PROTEIN UA: NEGATIVE
RBC UA: NEGATIVE

## 2018-05-06 NOTE — Progress Notes (Signed)
Patient ID: Carla Rose, female   DOB: 1996/05/19, 22 y.o.   MRN: [id number]  LOW-RISK PREGNANCY VISIT Patient name: Carla Rose MRN [id number]  Date of birth: [birth year] Chief Complaint:   Routine Prenatal Visit (PN2)  History of Present Illness:   Carla Rose is a 22 y.o. G42P2002 female at [redacted]w[redacted]d with an Estimated Date of Delivery: 07/25/18 being seen today for ongoing management of a low-risk pregnancy.  Today she reports no complaints. The baby is staying very active. [facility] is here to see her today. Denies bleeding and any other concerns. She is interested in Nexplanon, which she has used before. This is her third child and she plans on breastfeeding.   Contractions: Not present. Vag. Bleeding: None.  Movement: Present. denies leaking of fluid. Review of Systems:   Pertinent items are noted in HPI Denies abnormal vaginal discharge w/ itching/odor/irritation, headaches, visual changes, shortness of breath, chest pain, abdominal pain, severe nausea/vomiting, or problems with urination or bowel movements unless otherwise stated above. Pertinent History Reviewed:  Reviewed past medical,surgical, social, obstetrical and family history.  Reviewed problem list, medications and allergies. Physical Assessment:   Vitals:   05/06/18 0905  BP: 109/67  Pulse: (!) 109  Weight: 185 lb 9.6 oz (84.2 kg)  Body mass index is 28.22 kg/m.        Physical Examination:   General appearance: Well appearing, and in no distress  Mental status: Alert, oriented to person, place, and time  Skin: Warm & dry  Cardiovascular: Normal heart rate noted  Respiratory: Normal respiratory effort, no distress  Abdomen: Soft, gravid, nontender  Pelvic: Cervical exam deferred         Extremities: Edema: None  Fetal Status: Fetal Heart Rate (bpm): 131 Fundal Height: 29 cm Movement: Present    Results for orders placed or performed in visit on 05/06/18 (from the past 24 hour(s))  POCT Urinalysis Dipstick   Collection Time: 05/06/18  9:06 AM  Result Value Ref Range   Color, UA     Clarity, UA     Glucose, UA Negative Negative   Bilirubin, UA     Ketones, UA neg    Spec Grav, UA     Blood, UA neg    pH, UA     Protein, UA Negative Negative   Urobilinogen, UA     Nitrite, UA neg    Leukocytes, UA Negative Negative   Appearance     Odor      Assessment & Plan:  1) Low-risk pregnancy G3P2002 at [redacted]w[redacted]d with an Estimated Date of Delivery: 07/25/18    Plan:  Continue routine obstetrical care  Meds: No orders of the defined types were placed in this encounter.  Labs/procedures today: flu/tdap shots, PN2  Follow-up: Return in about 4 weeks (around 06/03/2018) for LROB.  Orders Placed This Encounter  Procedures  . POCT Urinalysis Dipstick   By signing my name below, I, Pietro Cassis, attest that this documentation has been prepared under the direction and in the presence of Tilda Burrow, MD Electronically Signed: Pietro Cassis, Medical Scribe. 05/06/18. 9:15 AM.  I personally performed the services described in this documentation, which was SCRIBED in my presence. The recorded information has been reviewed and considered accurate. It has been edited as necessary during review. Tilda Burrow, MD

## 2018-05-07 ENCOUNTER — Telehealth: Payer: Self-pay | Admitting: *Deleted

## 2018-05-07 ENCOUNTER — Other Ambulatory Visit: Payer: Self-pay | Admitting: Women's Health

## 2018-05-07 LAB — CBC
HEMATOCRIT: 29.7 % — AB (ref 34.0–46.6)
HEMOGLOBIN: 10.1 g/dL — AB (ref 11.1–15.9)
MCH: 29.1 pg (ref 26.6–33.0)
MCHC: 34 g/dL (ref 31.5–35.7)
MCV: 86 fL (ref 79–97)
Platelets: 180 10*3/uL (ref 150–450)
RBC: 3.47 x10E6/uL — ABNORMAL LOW (ref 3.77–5.28)
RDW: 14.2 % (ref 12.3–15.4)
WBC: 7.9 10*3/uL (ref 3.4–10.8)

## 2018-05-07 LAB — GLUCOSE TOLERANCE, 2 HOURS W/ 1HR
GLUCOSE, 1 HOUR: 137 mg/dL (ref 65–179)
GLUCOSE, 2 HOUR: 66 mg/dL (ref 65–152)
Glucose, Fasting: 79 mg/dL (ref 65–91)

## 2018-05-07 LAB — RPR: RPR: NONREACTIVE

## 2018-05-07 LAB — ANTIBODY SCREEN: ANTIBODY SCREEN: NEGATIVE

## 2018-05-07 LAB — HIV ANTIBODY (ROUTINE TESTING W REFLEX): HIV Screen 4th Generation wRfx: NONREACTIVE

## 2018-05-07 MED ORDER — FERROUS SULFATE 325 (65 FE) MG PO TABS: 325.0000 mg | ORAL_TABLET | Freq: Two times a day (BID) | ORAL | 3 refills | Status: DC

## 2018-05-07 NOTE — Telephone Encounter (Signed)
LMOVM that she is anemic and will need to take additional iron tablets along with PNV. Also encouraged to increase diet in iron rich foods like green-leafy veggies, red meat, beans.

## 2018-06-03 ENCOUNTER — Ambulatory Visit (INDEPENDENT_AMBULATORY_CARE_PROVIDER_SITE_OTHER): Payer: Medicaid Other | Admitting: Women's Health

## 2018-06-03 ENCOUNTER — Encounter: Payer: Self-pay | Admitting: Women's Health

## 2018-06-03 VITALS — BP 117/80 | HR 117 | Wt 187.0 lb

## 2018-06-03 DIAGNOSIS — Z331 Pregnant state, incidental: Secondary | ICD-10-CM

## 2018-06-03 DIAGNOSIS — Z3483 Encounter for supervision of other normal pregnancy, third trimester: Secondary | ICD-10-CM

## 2018-06-03 DIAGNOSIS — Z23 Encounter for immunization: Secondary | ICD-10-CM

## 2018-06-03 DIAGNOSIS — Z3A32 32 weeks gestation of pregnancy: Secondary | ICD-10-CM | POA: Diagnosis not present

## 2018-06-03 DIAGNOSIS — Z1389 Encounter for screening for other disorder: Secondary | ICD-10-CM

## 2018-06-03 LAB — POCT URINALYSIS DIPSTICK OB
GLUCOSE, UA: NEGATIVE
KETONES UA: NEGATIVE
Nitrite, UA: NEGATIVE
RBC UA: NEGATIVE

## 2018-06-03 NOTE — Progress Notes (Signed)
   LOW-RISK PREGNANCY VISIT Patient name: Carla Rose MRN [id number]  Date of birth: 10-16-1995 Chief Complaint:   Routine Prenatal Visit  History of Present Illness:   Carla Rose is a 22 y.o. G73P2002 female at [redacted]w[redacted]d with an Estimated Date of Delivery: 07/25/18 being seen today for ongoing management of a low-risk pregnancy.  Today she reports had a lot of mucous when wiping the other night, no uc's, vb, lof, etc. Denies abnormal discharge, itching/odor/irritation.   Contractions: Not present.  .  Movement: Present. denies leaking of fluid. Review of Systems:   Pertinent items are noted in HPI Denies abnormal vaginal discharge w/ itching/odor/irritation, headaches, visual changes, shortness of breath, chest pain, abdominal pain, severe nausea/vomiting, or problems with urination or bowel movements unless otherwise stated above. Pertinent History Reviewed:  Reviewed past medical,surgical, social, obstetrical and family history.  Reviewed problem list, medications and allergies. Physical Assessment:   Vitals:   06/03/18 0913  BP: 117/80  Pulse: (!) 117  Weight: 187 lb (84.8 kg)  Body mass index is 28.43 kg/m.        Physical Examination:   General appearance: Well appearing, and in no distress  Mental status: Alert, oriented to person, place, and time  Skin: Warm & dry  Cardiovascular: Normal heart rate noted  Respiratory: Normal respiratory effort, no distress  Abdomen: Soft, gravid, nontender  Pelvic: Cervical exam deferred         Extremities: Edema: None  Fetal Status: Fetal Heart Rate (bpm): 125 Fundal Height: 33 cm Movement: Present    Results for orders placed or performed in visit on 06/03/18 (from the past 24 hour(s))  POC Urinalysis Dipstick OB   Collection Time: 06/03/18  9:17 AM  Result Value Ref Range   Color, UA     Clarity, UA     Glucose, UA Negative Negative   Bilirubin, UA     Ketones, UA neg    Spec Grav, UA     Blood, UA neg    pH, UA     POC,PROTEIN,UA Trace Negative, Trace   Urobilinogen, UA     Nitrite, UA neg    Leukocytes, UA     Appearance     Odor      Assessment & Plan:  1) Low-risk pregnancy G3P2002 at [redacted]w[redacted]d with an Estimated Date of Delivery: 07/25/18    Meds: No orders of the defined types were placed in this encounter.  Labs/procedures today: tdap, flu shot  Plan:  Continue routine obstetrical care   Reviewed: Preterm labor symptoms and general obstetric precautions including but not limited to vaginal bleeding, contractions, leaking of fluid and fetal movement were reviewed in detail with the patient.  All questions were answered  Follow-up: Return in about 2 weeks (around 06/17/2018) for LROB.  Orders Placed This Encounter  Procedures  . Tdap vaccine greater than or equal to 7yo IM  . Flu Vaccine QUAD 36+ mos IM (Fluarix, Quad PF)  . POC Urinalysis Dipstick OB   Cheral Marker CNM, [facility] 06/03/2018 9:41 AM

## 2018-06-03 NOTE — Patient Instructions (Signed)
Carla Rose, I greatly value your feedback.  If you receive a survey following your visit with [place] today, we appreciate you taking the time to fill it out.  Thanks, Joellyn Haff, CNM, WHNP-BC   Call the office [phone]) or go to [facility] if:  You begin to have strong, frequent contractions  Your water breaks.  Sometimes it is a big gush of fluid, sometimes it is just a trickle that keeps getting your panties wet or running down your legs  You have vaginal bleeding.  It is normal to have a small amount of spotting if your cervix was checked.   You don't feel your baby moving like normal.  If you don't, get you something to eat and drink and lay down and focus on feeling your baby move.  You should feel at least 10 movements in 2 hours.  If you don't, you should call the office or go to [facility].    Tdap Vaccine  It is recommended that you get the Tdap vaccine during the third trimester of EACH pregnancy to help protect your baby from getting pertussis (whooping cough)  27-36 weeks is the BEST time to do this so that you can pass the protection on to your baby. During pregnancy is better than after pregnancy, but if you are unable to get it during pregnancy it will be offered at the hospital.   You can get this vaccine with [place], at the health department, your family doctor, or some local pharmacies  Everyone who will be around your baby should also be up-to-date on their vaccines before the baby comes. Adults (who are not pregnant) only need 1 dose of Tdap during adulthood.   Third Trimester of Pregnancy The third trimester is from week 29 through week 42, months 7 through 9. The third trimester is a time when the fetus is growing rapidly. At the end of the ninth month, the fetus is about 20 inches in length and weighs 6-10 pounds.  BODY CHANGES Your body goes through many changes during pregnancy. The changes vary from woman to woman.   Your weight will continue to  increase. You can expect to gain 25-35 pounds (11-16 kg) by the end of the pregnancy.  You may begin to get stretch marks on your hips, abdomen, and breasts.  You may urinate more often because the fetus is moving lower into your pelvis and pressing on your bladder.  You may develop or continue to have heartburn as a result of your pregnancy.  You may develop constipation because certain hormones are causing the muscles that push waste through your intestines to slow down.  You may develop hemorrhoids or swollen, bulging veins (varicose veins).  You may have pelvic pain because of the weight gain and pregnancy hormones relaxing your joints between the bones in your pelvis. Backaches may result from overexertion of the muscles supporting your posture.  You may have changes in your hair. These can include thickening of your hair, rapid growth, and changes in texture. Some women also have hair loss during or after pregnancy, or hair that feels dry or thin. Your hair will most likely return to normal after your baby is born.  Your breasts will continue to grow and be tender. A yellow discharge may leak from your breasts called colostrum.  Your belly button may stick out.  You may feel short of breath because of your expanding uterus.  You may notice the fetus "dropping," or moving lower in  your abdomen.  You may have a bloody mucus discharge. This usually occurs a few days to a week before labor begins.  Your cervix becomes thin and soft (effaced) near your due date. WHAT TO EXPECT AT YOUR PRENATAL EXAMS  You will have prenatal exams every 2 weeks until week 36. Then, you will have weekly prenatal exams. During a routine prenatal visit:  You will be weighed to make sure you and the fetus are growing normally.  Your blood pressure is taken.  Your abdomen will be measured to track your baby's growth.  The fetal heartbeat will be listened to.  Any test results from the previous visit  will be discussed.  You may have a cervical check near your due date to see if you have effaced. At around 36 weeks, your caregiver will check your cervix. At the same time, your caregiver will also perform a test on the secretions of the vaginal tissue. This test is to determine if a type of bacteria, Group B streptococcus, is present. Your caregiver will explain this further. Your caregiver may ask you:  What your birth plan is.  How you are feeling.  If you are feeling the baby move.  If you have had any abnormal symptoms, such as leaking fluid, bleeding, severe headaches, or abdominal cramping.  If you have any questions. Other tests or screenings that may be performed during your third trimester include:  Blood tests that check for low iron levels (anemia).  Fetal testing to check the health, activity level, and growth of the fetus. Testing is done if you have certain medical conditions or if there are problems during the pregnancy. FALSE LABOR You may feel small, irregular contractions that eventually go away. These are called Braxton Hicks contractions, or false labor. Contractions may last for hours, days, or even weeks before true labor sets in. If contractions come at regular intervals, intensify, or become painful, it is best to be seen by your caregiver.  SIGNS OF LABOR   Menstrual-like cramps.  Contractions that are 5 minutes apart or less.  Contractions that start on the top of the uterus and spread down to the lower abdomen and back.  A sense of increased pelvic pressure or back pain.  A watery or bloody mucus discharge that comes from the vagina. If you have any of these signs before the 37th week of pregnancy, call your caregiver right away. You need to go to the hospital to get checked immediately. HOME CARE INSTRUCTIONS   Avoid all smoking, herbs, alcohol, and unprescribed drugs. These chemicals affect the formation and growth of the baby.  Follow your  caregiver's instructions regarding medicine use. There are medicines that are either safe or unsafe to take during pregnancy.  Exercise only as directed by your caregiver. Experiencing uterine cramps is a good sign to stop exercising.  Continue to eat regular, healthy meals.  Wear a good support bra for breast tenderness.  Do not use hot tubs, steam rooms, or saunas.  Wear your seat belt at all times when driving.  Avoid raw meat, uncooked cheese, cat litter boxes, and soil used by cats. These carry germs that can cause birth defects in the baby.  Take your prenatal vitamins.  Try taking a stool softener (if your caregiver approves) if you develop constipation. Eat more high-fiber foods, such as fresh vegetables or fruit and whole grains. Drink plenty of fluids to keep your urine clear or pale yellow.  Take warm sitz baths to  soothe any pain or discomfort caused by hemorrhoids. Use hemorrhoid cream if your caregiver approves.  If you develop varicose veins, wear support hose. Elevate your feet for 15 minutes, 3-4 times a day. Limit salt in your diet.  Avoid heavy lifting, wear low heal shoes, and practice good posture.  Rest a lot with your legs elevated if you have leg cramps or low back pain.  Visit your dentist if you have not gone during your pregnancy. Use a soft toothbrush to brush your teeth and be gentle when you floss.  A sexual relationship may be continued unless your caregiver directs you otherwise.  Do not travel far distances unless it is absolutely necessary and only with the approval of your caregiver.  Take prenatal classes to understand, practice, and ask questions about the labor and delivery.  Make a trial run to the hospital.  Pack your hospital bag.  Prepare the baby's nursery.  Continue to go to all your prenatal visits as directed by your caregiver. SEEK MEDICAL CARE IF:  You are unsure if you are in labor or if your water has broken.  You have  dizziness.  You have mild pelvic cramps, pelvic pressure, or nagging pain in your abdominal area.  You have persistent nausea, vomiting, or diarrhea.  You have a bad smelling vaginal discharge.  You have pain with urination. SEEK IMMEDIATE MEDICAL CARE IF:   You have a fever.  You are leaking fluid from your vagina.  You have spotting or bleeding from your vagina.  You have severe abdominal cramping or pain.  You have rapid weight loss or gain.  You have shortness of breath with chest pain.  You notice sudden or extreme swelling of your face, hands, ankles, feet, or legs.  You have not felt your baby move in over an hour.  You have severe headaches that do not go away with medicine.  You have vision changes. Document Released: 07/08/2001 Document Revised: 07/19/2013 Document Reviewed: 09/14/2012 [facility] Patient Information 2015 [place], [place]. This information is not intended to replace advice given to you by your health care provider. Make sure you discuss any questions you have with your health care provider.

## 2018-06-17 ENCOUNTER — Encounter: Payer: Self-pay | Admitting: Women's Health

## 2018-06-17 ENCOUNTER — Ambulatory Visit (INDEPENDENT_AMBULATORY_CARE_PROVIDER_SITE_OTHER): Payer: Medicaid Other | Admitting: Women's Health

## 2018-06-17 VITALS — BP 134/84 | HR 129 | Wt 192.0 lb

## 2018-06-17 DIAGNOSIS — N898 Other specified noninflammatory disorders of vagina: Secondary | ICD-10-CM | POA: Diagnosis not present

## 2018-06-17 DIAGNOSIS — O26893 Other specified pregnancy related conditions, third trimester: Secondary | ICD-10-CM | POA: Diagnosis not present

## 2018-06-17 DIAGNOSIS — R300 Dysuria: Secondary | ICD-10-CM

## 2018-06-17 DIAGNOSIS — Z1389 Encounter for screening for other disorder: Secondary | ICD-10-CM

## 2018-06-17 DIAGNOSIS — Z3A34 34 weeks gestation of pregnancy: Secondary | ICD-10-CM

## 2018-06-17 DIAGNOSIS — N760 Acute vaginitis: Secondary | ICD-10-CM | POA: Diagnosis not present

## 2018-06-17 DIAGNOSIS — B9689 Other specified bacterial agents as the cause of diseases classified elsewhere: Secondary | ICD-10-CM

## 2018-06-17 DIAGNOSIS — Z331 Pregnant state, incidental: Secondary | ICD-10-CM

## 2018-06-17 DIAGNOSIS — Z3483 Encounter for supervision of other normal pregnancy, third trimester: Secondary | ICD-10-CM

## 2018-06-17 LAB — POCT WET PREP (WET MOUNT)
Clue Cells Wet Prep Whiff POC: POSITIVE
Trichomonas Wet Prep HPF POC: ABSENT

## 2018-06-17 LAB — POCT URINALYSIS DIPSTICK OB
Blood, UA: NEGATIVE
Glucose, UA: NEGATIVE
Ketones, UA: NEGATIVE
NITRITE UA: NEGATIVE

## 2018-06-17 MED ORDER — METRONIDAZOLE 500 MG PO TABS: 500.0000 mg | ORAL_TABLET | Freq: Two times a day (BID) | ORAL | 0 refills | Status: DC

## 2018-06-17 MED ORDER — NITROFURANTOIN MONOHYD MACRO 100 MG PO CAPS: 100.0000 mg | ORAL_CAPSULE | Freq: Two times a day (BID) | ORAL | 0 refills | Status: DC

## 2018-06-17 MED ORDER — CYCLOBENZAPRINE HCL 10 MG PO TABS: 10.0000 mg | ORAL_TABLET | Freq: Two times a day (BID) | ORAL | 0 refills | Status: DC | PRN

## 2018-06-17 NOTE — Patient Instructions (Signed)
Carla Rose, I greatly value your feedback.  If you receive a survey following your visit with us today, we appreciate you taking the time to fill it out.  Thanks, Joellyn HaffKim Dulcy Sida, CNM, WHNP-BC   Call the office [phone]) or [facility] to [facility] if:  You begin to have strong, frequent contractions  Your water breaks.  Sometimes it is a big gush of fluid, sometimes it is just a trickle that keeps getting your panties wet or running down your legs  You have vaginal bleeding.  It is normal to have a small amount of spotting if your cervix was checked.   You don't feel your baby moving like normal.  If you don't, get you something to eat and drink and lay down and focus on feeling your baby move.  You should feel at least 10 movements in 2 hours.  If you don't, you should call the office or go to [facility].   Call the office [phone]) or [facility] to [facility] for these signs of pre-eclampsia:  Severe headache that does not go away with Tylenol  Visual changes- seeing spots, double, blurred vision  Pain under your right breast or upper abdomen that does not go away with Tums or heartburn medicine  Nausea and/or vomiting  Severe swelling in your hands, feet, and face        Preterm Labor and Birth Information The normal length of a pregnancy is 39-41 weeks. Preterm labor is when labor starts before 37 completed weeks of pregnancy. What are the risk factors for preterm labor? Preterm labor is more likely to occur in women who:  Have certain infections during pregnancy such as a bladder infection, sexually transmitted infection, or infection inside the uterus (chorioamnionitis).  Have a shorter-than-normal cervix.  Have gone into preterm labor before.  Have had surgery on their cervix.  Are younger than age 21 or older than age 21.  Are African American.  Are pregnant with twins or multiple babies (multiple gestation).  Take street drugs or smoke while  pregnant.  Do not gain enough weight while pregnant.  Became pregnant shortly after having been pregnant.  What are the symptoms of preterm labor? Symptoms of preterm labor include:  Cramps similar to those that can happen during a menstrual period. The cramps may happen with diarrhea.  Pain in the abdomen or lower back.  Regular uterine contractions that may feel like tightening of the abdomen.  A feeling of increased pressure in the pelvis.  Increased watery or bloody mucus discharge from the vagina.  Water breaking (ruptured amniotic sac).  Why is it important to recognize signs of preterm labor? It is important to recognize signs of preterm labor because babies who are born prematurely may not be fully developed. This can put them at an increased risk for:  Long-term (chronic) heart and lung problems.  Difficulty immediately after birth with regulating body systems, including blood sugar, body temperature, heart rate, and breathing rate.  Bleeding in the brain.  Cerebral palsy.  Learning difficulties.  Death.  These risks are highest for babies who are born before 34 weeks of pregnancy. How is preterm labor treated? Treatment depends on the length of your pregnancy, your condition, and the health of your baby. It may involve:  Having a stitch (suture) placed in your cervix to prevent your cervix from opening too early (cerclage).  Taking or being given medicines, such as: ? Hormone medicines. These may be given early in pregnancy to help  support the pregnancy. ? Medicine to stop contractions. ? Medicines to help mature the baby's lungs. These may be prescribed if the risk of delivery is high. ? Medicines to prevent your baby from developing cerebral palsy.  If the labor happens before 34 weeks of pregnancy, you may need to stay in the hospital. What should I do if I think I am in preterm labor? If you think that you are going into preterm labor, call your health  care provider right away. How can I prevent preterm labor in future pregnancies? To increase your chance of having a full-term pregnancy:  Do not use any tobacco products, such as cigarettes, chewing tobacco, and e-cigarettes. If you need help quitting, ask your health care provider.  Do not use street drugs or medicines that have not been prescribed to you during your pregnancy.  Talk with your health care provider before taking any herbal supplements, even if you have been taking them regularly.  Make sure you gain a healthy amount of weight during your pregnancy.  Watch for infection. If you think that you might have an infection, get it checked right away.  Make sure to tell your health care provider if you have gone into preterm labor before.  This information is not intended to replace advice given to you by your health care provider. Make sure you discuss any questions you have with your health care provider. Document Released: 10/04/2003 Document Revised: 12/25/2015 Document Reviewed: 12/05/2015 Elsevier Interactive Patient Education  2018 [employer].

## 2018-06-17 NOTE — Progress Notes (Signed)
LOW-RISK PREGNANCY VISIT Patient name: Carla Rose MRN [id number]  Date of birth: 18-Apr-1996 Chief Complaint:   Routine Prenatal Visit  History of Present Illness:   Carla Rose is a 22 y.o. G48P2002 female at [redacted]w[redacted]d with an Estimated Date of Delivery: 07/25/18 being seen today for ongoing management of a low-risk pregnancy.  Today she reports feels like she has UTI, some dysuria, pressure. Thinks she lost some of mucous plug, wants to be checked. Denies abnormal discharge, itching/odor/irritation. Denies changes in usual headaches, no visual changes, ruq/epigastric pain, n/v.  Requests refill of flexeril for headaches- does help when she takes it.   Contractions: Not present. Vag. Bleeding: None.  Movement: Present. denies leaking of fluid. Review of Systems:   Pertinent items are noted in HPI Denies abnormal vaginal discharge w/ itching/odor/irritation, headaches, visual changes, shortness of breath, chest pain, abdominal pain, severe nausea/vomiting, or problems with urination or bowel movements unless otherwise stated above. Pertinent History Reviewed:  Reviewed past medical,surgical, social, obstetrical and family history.  Reviewed problem list, medications and allergies. Physical Assessment:   Vitals:   06/17/18 1626  BP: 134/84  Pulse: (!) 129  Weight: 192 lb (87.1 kg)  Body mass index is 29.19 kg/m.        Physical Examination:   General appearance: Well appearing, and in no distress  Mental status: Alert, oriented to person, place, and time  Skin: Warm & dry  Cardiovascular: Normal heart rate noted  Respiratory: Normal respiratory effort, no distress  Abdomen: Soft, gravid, nontender  Pelvic: Cervical exam performed spec exam: unable to adequately visualize cx. Mod amt thin white malodorous d/c Dilation: 2 Effacement (%): Thick Station: -3  Extremities: Edema: None  Fetal Status: Fetal Heart Rate (bpm): 140 Fundal Height: 35 cm Movement: Present Presentation:  Vertex  Results for orders placed or performed in visit on 06/17/18 (from the past 24 hour(s))  POC Urinalysis Dipstick OB   Collection Time: 06/17/18  4:27 PM  Result Value Ref Range   Color, UA     Clarity, UA     Glucose, UA Negative Negative   Bilirubin, UA     Ketones, UA neg    Spec Grav, UA     Blood, UA neg    pH, UA     POC,PROTEIN,UA Small (1+) Negative, Trace, Small (1+), Moderate (2+), Large (3+), 4+   Urobilinogen, UA     Nitrite, UA neg    Leukocytes, UA Large (3+) (A) Negative   Appearance     Odor    POCT Wet Prep [facility] [place])   Collection Time: 06/17/18  5:03 PM  Result Value Ref Range   Source Wet Prep POC vaginal    WBC, Wet Prep HPF POC many    Bacteria Wet Prep HPF POC Few Few   BACTERIA WET PREP MORPHOLOGY POC     Clue Cells Wet Prep HPF POC Many (A) None   Clue Cells Wet Prep Whiff POC Positive Whiff    Yeast Wet Prep HPF POC None None   KOH Wet Prep POC     Trichomonas Wet Prep HPF POC Absent Absent    Assessment & Plan:  1) Low-risk pregnancy G3P2002 at [redacted]w[redacted]d with an Estimated Date of Delivery: 07/25/18   2) BV, Rx metronidazole 500mg  BID x 7d for BV, no sex while taking   3) Presumed UTI> rx macrobid, send cx  4) 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 Reviewed pre-e s/s, reasons  to seek care. Continue ASA   Meds:  Meds ordered this encounter  Medications  . nitrofurantoin, macrocrystal-monohydrate, (MACROBID) 100 MG capsule    Sig: Take 1 capsule (100 mg total) by mouth 2 (two) times daily. X 7 days    Dispense:  14 capsule    Refill:  0    Order Specific Question:   Supervising Provider    Answer:   EURE, LUTHER H [[id number]]  . metroNIDAZOLE (FLAGYL) 500 MG tablet    Sig: Take 1 tablet (500 mg total) by mouth 2 (two) times daily.    Dispense:  14 tablet    Refill:  0    Order Specific Question:   Supervising Provider    Answer:   Despina HiddenEURE, LUTHER H [[id number]]  . cyclobenzaprine (FLEXERIL) 10 MG tablet    Sig: Take 1  tablet (10 mg total) by mouth 2 (two) times daily as needed for muscle spasms. Use for migraines as well    Dispense:  20 tablet    Refill:  0    Order Specific Question:   Supervising Provider    Answer:   Duane LopeEURE, LUTHER H [[id number]]   Labs/procedures today: spec exam, wet prep, urine cx, gc/ct  Plan:  Continue routine obstetrical care   Reviewed: Preterm labor symptoms and general obstetric precautions including but not limited to vaginal bleeding, contractions, leaking of fluid and fetal movement were reviewed in detail with the patient.  All questions were answered  Follow-up: Return in about 1 week (around 06/24/2018) for LROB.  Orders Placed This Encounter  Procedures  . Urine Culture  . GC/Chlamydia Probe Amp  . POC Urinalysis Dipstick OB  . POCT Wet Prep [facility] [place]   Cheral MarkerKimberly R Buffy Ehler CNM, [facility] 06/17/2018 5:05 PM

## 2018-06-20 LAB — URINE CULTURE: Organism ID, Bacteria: NO GROWTH

## 2018-06-20 LAB — GC/CHLAMYDIA PROBE AMP
Chlamydia trachomatis, NAA: NEGATIVE
Neisseria gonorrhoeae by PCR: NEGATIVE

## 2018-06-23 ENCOUNTER — Encounter: Payer: Self-pay | Admitting: Women's Health

## 2018-06-23 ENCOUNTER — Ambulatory Visit (INDEPENDENT_AMBULATORY_CARE_PROVIDER_SITE_OTHER): Payer: Medicaid Other | Admitting: Women's Health

## 2018-06-23 VITALS — BP 128/83 | HR 127 | Wt 194.0 lb

## 2018-06-23 DIAGNOSIS — Z1389 Encounter for screening for other disorder: Secondary | ICD-10-CM

## 2018-06-23 DIAGNOSIS — Z3A35 35 weeks gestation of pregnancy: Secondary | ICD-10-CM

## 2018-06-23 DIAGNOSIS — Z3483 Encounter for supervision of other normal pregnancy, third trimester: Secondary | ICD-10-CM

## 2018-06-23 DIAGNOSIS — Z331 Pregnant state, incidental: Secondary | ICD-10-CM

## 2018-06-23 LAB — POCT URINALYSIS DIPSTICK OB
GLUCOSE, UA: NEGATIVE
Ketones, UA: NEGATIVE
NITRITE UA: NEGATIVE
PROTEIN: NEGATIVE
RBC UA: NEGATIVE

## 2018-06-23 NOTE — Progress Notes (Signed)
   LOW-RISK PREGNANCY VISIT Patient name: Carla Rose MRN [id number]  Date of birth: 04/16/1996 Chief Complaint:   Routine Prenatal Visit  History of Present Illness:   Carla Rose is a 22 y.o. 583P2002 female at 8333w3d with an Estimated Date of Delivery: 07/25/18 being seen today for ongoing management of a low-risk pregnancy.  Today she reports no complaints. Contractions: Not present. Vag. Bleeding: None.  Movement: Present. denies leaking of fluid. Review of Systems:   Pertinent items are noted in HPI Denies abnormal vaginal discharge w/ itching/odor/irritation, headaches, visual changes, shortness of breath, chest pain, abdominal pain, severe nausea/vomiting, or problems with urination or bowel movements unless otherwise stated above. Pertinent History Reviewed:  Reviewed past medical,surgical, social, obstetrical and family history.  Reviewed problem list, medications and allergies. Physical Assessment:   Vitals:   06/23/18 0857  BP: 128/83  Pulse: (!) 127  Weight: 194 lb (88 kg)  Body mass index is 29.5 kg/m.        Physical Examination:   General appearance: Well appearing, and in no distress  Mental status: Alert, oriented to person, place, and time  Skin: Warm & dry  Cardiovascular: Normal heart rate noted  Respiratory: Normal respiratory effort, no distress  Abdomen: Soft, gravid, nontender  Pelvic: Cervical exam deferred         Extremities: Edema: None  Fetal Status: Fetal Heart Rate (bpm): 138 Fundal Height: 36 cm Movement: Present    Results for orders placed or performed in visit on 06/23/18 (from the past 24 hour(s))  POC Urinalysis Dipstick OB   Collection Time: 06/23/18  8:59 AM  Result Value Ref Range   Color, UA     Clarity, UA     Glucose, UA Negative Negative   Bilirubin, UA     Ketones, UA neg    Spec Grav, UA     Blood, UA neg    pH, UA     POC,PROTEIN,UA Negative Negative, Trace, Small (1+), Moderate (2+), Large (3+), 4+   Urobilinogen,  UA     Nitrite, UA neg    Leukocytes, UA Small (1+) (A) Negative   Appearance     Odor      Assessment & Plan:  1) Low-risk pregnancy G3P2002 at 3433w3d with an Estimated Date of Delivery: 07/25/18   2) H/O GHTN, ASA   Meds: No orders of the defined types were placed in this encounter.  Labs/procedures today: none  Plan:  Continue routine obstetrical care   Reviewed: Preterm labor symptoms and general obstetric precautions including but not limited to vaginal bleeding, contractions, leaking of fluid and fetal movement were reviewed in detail with the patient.  All questions were answered  Follow-up: Return in about 8 days (around 07/01/2018) for LROB. and gbs  Orders Placed This Encounter  Procedures  . POC Urinalysis Dipstick OB   Cheral MarkerKimberly R Agustina Witzke CNM, [facility] 06/23/2018 9:08 AM

## 2018-06-23 NOTE — Patient Instructions (Signed)
Carla Rose, I greatly value your feedback.  If you receive a survey following your visit with us today, we appreciate you taking the time to fill it out.  Thanks, Joellyn HaffKim Malachi Suderman, CNM, WHNP-BC   Call the office [phone]) or go to [facility] if:  You begin to have strong, frequent contractions  Your water breaks.  Sometimes it is a big gush of fluid, sometimes it is just a trickle that keeps getting your panties wet or running down your legs  You have vaginal bleeding.  It is normal to have a small amount of spotting if your cervix was checked.   You don't feel your baby moving like normal.  If you don't, get you something to eat and drink and lay down and focus on feeling your baby move.  You should feel at least 10 movements in 2 hours.  If you don't, you should call the office or go to [facility].     Preterm Labor and Birth Information The normal length of a pregnancy is 39-41 weeks. Preterm labor is when labor starts before 37 completed weeks of pregnancy. What are the risk factors for preterm labor? Preterm labor is more likely to occur in women who:  Have certain infections during pregnancy such as a bladder infection, sexually transmitted infection, or infection inside the uterus (chorioamnionitis).  Have a shorter-than-normal cervix.  Have gone into preterm labor before.  Have had surgery on their cervix.  Are younger than age 21 or older than age 21.  Are African American.  Are pregnant with twins or multiple babies (multiple gestation).  Take street drugs or smoke while pregnant.  Do not gain enough weight while pregnant.  Became pregnant shortly after having been pregnant.  What are the symptoms of preterm labor? Symptoms of preterm labor include:  Cramps similar to those that can happen during a menstrual period. The cramps may happen with diarrhea.  Pain in the abdomen or lower back.  Regular uterine contractions that may feel like tightening  of the abdomen.  A feeling of increased pressure in the pelvis.  Increased watery or bloody mucus discharge from the vagina.  Water breaking (ruptured amniotic sac).  Why is it important to recognize signs of preterm labor? It is important to recognize signs of preterm labor because babies who are born prematurely may not be fully developed. This can put them at an increased risk for:  Long-term (chronic) heart and lung problems.  Difficulty immediately after birth with regulating body systems, including blood sugar, body temperature, heart rate, and breathing rate.  Bleeding in the brain.  Cerebral palsy.  Learning difficulties.  Death.  These risks are highest for babies who are born before 34 weeks of pregnancy. How is preterm labor treated? Treatment depends on the length of your pregnancy, your condition, and the health of your baby. It may involve:  Having a stitch (suture) placed in your cervix to prevent your cervix from opening too early (cerclage).  Taking or being given medicines, such as: ? Hormone medicines. These may be given early in pregnancy to help support the pregnancy. ? Medicine to stop contractions. ? Medicines to help mature the baby's lungs. These may be prescribed if the risk of delivery is high. ? Medicines to prevent your baby from developing cerebral palsy.  If the labor happens before 34 weeks of pregnancy, you may need to stay in the hospital. What should I do if I think I am in preterm labor? If you  think that you are going into preterm labor, call your health care provider right away. How can I prevent preterm labor in future pregnancies? To increase your chance of having a full-term pregnancy:  Do not use any tobacco products, such as cigarettes, chewing tobacco, and e-cigarettes. If you need help quitting, ask your health care provider.  Do not use street drugs or medicines that have not been prescribed to you during your pregnancy.  Talk  with your health care provider before taking any herbal supplements, even if you have been taking them regularly.  Make sure you gain a healthy amount of weight during your pregnancy.  Watch for infection. If you think that you might have an infection, get it checked right away.  Make sure to tell your health care provider if you have gone into preterm labor before.  This information is not intended to replace advice given to you by your health care provider. Make sure you discuss any questions you have with your health care provider. Document Released: 10/04/2003 Document Revised: 12/25/2015 Document Reviewed: 12/05/2015 Elsevier Interactive Patient Education  2018 [employer].

## 2018-07-01 ENCOUNTER — Encounter: Payer: Self-pay | Admitting: Obstetrics and Gynecology

## 2018-07-01 ENCOUNTER — Ambulatory Visit (INDEPENDENT_AMBULATORY_CARE_PROVIDER_SITE_OTHER): Payer: Medicaid Other | Admitting: Obstetrics and Gynecology

## 2018-07-01 VITALS — BP 122/70 | HR 97 | Wt 194.0 lb

## 2018-07-01 DIAGNOSIS — Z331 Pregnant state, incidental: Secondary | ICD-10-CM

## 2018-07-01 DIAGNOSIS — Z1389 Encounter for screening for other disorder: Secondary | ICD-10-CM

## 2018-07-01 DIAGNOSIS — Z3483 Encounter for supervision of other normal pregnancy, third trimester: Secondary | ICD-10-CM

## 2018-07-01 DIAGNOSIS — Z3A36 36 weeks gestation of pregnancy: Secondary | ICD-10-CM

## 2018-07-01 LAB — POCT URINALYSIS DIPSTICK OB
Blood, UA: NEGATIVE
Glucose, UA: NEGATIVE
Ketones, UA: NEGATIVE
Leukocytes, UA: NEGATIVE
Nitrite, UA: NEGATIVE
PROTEIN: NEGATIVE

## 2018-07-01 NOTE — Progress Notes (Signed)
Patient ID: Carla FontCayenne Kray, female   DOB: 09/25/1995, 22 y.o.   MRN: [id number]   LOW-RISK PREGNANCY VISIT Patient name: Carla Rose MRN [id number]  Date of birth: 07/13/1996 Chief Complaint:   Routine Prenatal Visit  History of Present Illness:   Carla Rose is a 22 y.o. 703P2002 female at 5832w4d with an Estimated Date of Delivery: 07/25/18 being seen today for ongoing management of a low-risk pregnancy.  Today she reports no complaints. This is her third child. She breast and bottle feeds her children. She is interested in using breastfeeding Nexplanon after this pregnancy.   Contractions: Irregular. Vag. Bleeding: None.  Movement: Present. denies leaking of fluid. Review of Systems:   Pertinent items are noted in HPI Denies abnormal vaginal discharge w/ itching/odor/irritation, headaches, visual changes, shortness of breath, chest pain, abdominal pain, severe nausea/vomiting, or problems with urination or bowel movements unless otherwise stated above. Pertinent History Reviewed:  Reviewed past medical,surgical, social, obstetrical and family history.  Reviewed problem list, medications and allergies. Physical Assessment:   Vitals:   07/01/18 1111  BP: 122/70  Pulse: 97  Weight: 194 lb (88 kg)  Body mass index is 29.5 kg/m.        Physical Examination:   General appearance: Well appearing, and in no distress  Mental status: Alert, oriented to person, place, and time  Skin: Warm & dry  Cardiovascular: Normal heart rate noted  Respiratory: Normal respiratory effort, no distress  Abdomen: Soft, gravid, nontender  Pelvic: Cervical exam performed Cervix is very posterior and baby's head is very low.        Extremities:    Fetal Status:     Movement: Present    Results for orders placed or performed in visit on 07/01/18 (from the past 24 hour(s))  POC Urinalysis Dipstick OB   Collection Time: 07/01/18 11:12 AM  Result Value Ref Range   Color, UA     Clarity, UA     Glucose, UA Negative Negative   Bilirubin, UA     Ketones, UA neg    Spec Grav, UA     Blood, UA neg    pH, UA     POC,PROTEIN,UA Negative Negative, Trace, Small (1+), Moderate (2+), Large (3+), 4+   Urobilinogen, UA     Nitrite, UA neg    Leukocytes, UA Negative Negative   Appearance     Odor      Assessment & Plan:  1) Low-risk pregnancy G3P2002 at 5332w4d with an Estimated Date of Delivery: 07/25/18   2) h/o GHTN, ASA   Plan:  Continue routine obstetrical care   Meds: No orders of the defined types were placed in this encounter.  Labs/procedures today: GBS, GC/CHL  Reviewed: Term labor symptoms and general obstetric precautions including but not limited to vaginal bleeding, contractions, leaking of fluid and fetal movement were reviewed in detail with the patient.  All questions were answered  Follow-up: Return in about 1 week (around 07/08/2018) for LROB.  Orders Placed This Encounter  Procedures  . Culture, beta strep (group b only)  . GC/Chlamydia Probe Amp  . POC Urinalysis Dipstick OB   By signing my name below, I, Pietro CassisEmily Tufford, attest that this documentation has been prepared under the direction and in the presence of Tilda BurrowFerguson, Ondrea Dow V, MD. Electronically Signed: Pietro CassisEmily Tufford, Medical Scribe. 07/01/18. 11:33 AM.  I personally performed the services described in this documentation, which was SCRIBED in my presence. The recorded information has been reviewed and considered accurate.  It has been edited as necessary during review. Tilda Burrow, MD

## 2018-07-04 LAB — GC/CHLAMYDIA PROBE AMP
Chlamydia trachomatis, NAA: NEGATIVE
Neisseria gonorrhoeae by PCR: NEGATIVE

## 2018-07-04 LAB — CULTURE, BETA STREP (GROUP B ONLY): STREP GP B CULTURE: POSITIVE — AB

## 2018-07-08 ENCOUNTER — Encounter: Payer: Self-pay | Admitting: Obstetrics & Gynecology

## 2018-07-08 ENCOUNTER — Other Ambulatory Visit: Payer: Self-pay

## 2018-07-08 ENCOUNTER — Ambulatory Visit (INDEPENDENT_AMBULATORY_CARE_PROVIDER_SITE_OTHER): Payer: Medicaid Other | Admitting: Obstetrics & Gynecology

## 2018-07-08 VITALS — BP 131/91 | HR 122 | Wt 196.0 lb

## 2018-07-08 DIAGNOSIS — Z331 Pregnant state, incidental: Secondary | ICD-10-CM

## 2018-07-08 DIAGNOSIS — Z3483 Encounter for supervision of other normal pregnancy, third trimester: Secondary | ICD-10-CM

## 2018-07-08 DIAGNOSIS — Z3A37 37 weeks gestation of pregnancy: Secondary | ICD-10-CM

## 2018-07-08 DIAGNOSIS — Z1389 Encounter for screening for other disorder: Secondary | ICD-10-CM

## 2018-07-08 LAB — POCT URINALYSIS DIPSTICK OB
Blood, UA: NEGATIVE
Glucose, UA: NEGATIVE
Ketones, UA: NEGATIVE
LEUKOCYTES UA: NEGATIVE
Nitrite, UA: NEGATIVE
POC,PROTEIN,UA: NEGATIVE

## 2018-07-08 NOTE — Progress Notes (Signed)
   LOW-RISK PREGNANCY VISIT Patient name: Carla FontCayenne Sledd MRN [id number]  Date of birth: 12/27/1995 Chief Complaint:   Routine Prenatal Visit  History of Present Illness:   Carla Rose is a 22 y.o. 473P2002 female at 1145w4d with an Estimated Date of Delivery: 07/25/18 being seen today for ongoing management of a low-risk pregnancy.  Today she reports no complaints. Contractions: Irregular. Vag. Bleeding: None.  Movement: Present. denies leaking of fluid. Review of Systems:   Pertinent items are noted in HPI Denies abnormal vaginal discharge w/ itching/odor/irritation, headaches, visual changes, shortness of breath, chest pain, abdominal pain, severe nausea/vomiting, or problems with urination or bowel movements unless otherwise stated above. Pertinent History Reviewed:  Reviewed past medical,surgical, social, obstetrical and family history.  Reviewed problem list, medications and allergies. Physical Assessment:   Vitals:   07/08/18 0953  BP: (!) 131/91  Pulse: (!) 122  Weight: 196 lb (88.9 kg)  Body mass index is 29.8 kg/m.        Physical Examination:   General appearance: Well appearing, and in no distress  Mental status: Alert, oriented to person, place, and time  Skin: Warm & dry  Cardiovascular: Normal heart rate noted  Respiratory: Normal respiratory effort, no distress  Abdomen: Soft, gravid, nontender  Pelvic: Cervical exam performed        2/th/ball/vertex  Extremities: Edema: Trace  Fetal Status:     Movement: Present    Results for orders placed or performed in visit on 07/08/18 (from the past 24 hour(s))  POC Urinalysis Dipstick OB   Collection Time: 07/08/18  9:53 AM  Result Value Ref Range   Color, UA     Clarity, UA     Glucose, UA Negative Negative   Bilirubin, UA     Ketones, UA neg    Spec Grav, UA     Blood, UA neg    pH, UA     POC,PROTEIN,UA Negative Negative, Trace, Small (1+), Moderate (2+), Large (3+), 4+   Urobilinogen, UA     Nitrite, UA  neg    Leukocytes, UA Negative Negative   Appearance     Odor      Assessment & Plan:  1) Low-risk pregnancy G3P2002 at 2845w4d with an Estimated Date of Delivery: 07/25/18   2) Hx of GHTN with BP creeping up a bit   Meds: No orders of the defined types were placed in this encounter.  Labs/procedures today:   Plan:  Continue routine obstetrical care , will shorten interval for follow up due to BP creeping up  Reviewed: Term labor symptoms and general obstetric precautions including but not limited to vaginal bleeding, contractions, leaking of fluid and fetal movement were reviewed in detail with the patient.  All questions were answered  Follow-up: Return in about 4 days (around 07/12/2018) for LROB to check BP status.  Orders Placed This Encounter  Procedures  . POC Urinalysis Dipstick OB   Lazaro ArmsLuther H Eure  07/08/2018 10:11 AM

## 2018-07-12 ENCOUNTER — Encounter: Payer: Self-pay | Admitting: Obstetrics & Gynecology

## 2018-07-12 ENCOUNTER — Ambulatory Visit (INDEPENDENT_AMBULATORY_CARE_PROVIDER_SITE_OTHER): Payer: Medicaid Other | Admitting: Obstetrics & Gynecology

## 2018-07-12 VITALS — BP 135/91 | HR 109 | Wt 196.0 lb

## 2018-07-12 DIAGNOSIS — Z3A38 38 weeks gestation of pregnancy: Secondary | ICD-10-CM

## 2018-07-12 DIAGNOSIS — Z331 Pregnant state, incidental: Secondary | ICD-10-CM

## 2018-07-12 DIAGNOSIS — Z3483 Encounter for supervision of other normal pregnancy, third trimester: Secondary | ICD-10-CM

## 2018-07-12 DIAGNOSIS — Z1389 Encounter for screening for other disorder: Secondary | ICD-10-CM

## 2018-07-12 LAB — POCT URINALYSIS DIPSTICK OB
Glucose, UA: NEGATIVE
KETONES UA: NEGATIVE
Nitrite, UA: NEGATIVE
POC,PROTEIN,UA: NEGATIVE

## 2018-07-12 NOTE — Progress Notes (Signed)
   LOW-RISK PREGNANCY VISIT Patient name: Carla Rose MRN [id number]  Date of birth: 02/23/1996 Chief Complaint:   Routine Prenatal Visit (having numbness in arms and feet; yeast infection)  History of Present Illness:   Carla Rose is a 22 y.o. 483P2002 female at [redacted]w[redacted]d with an Estimated Date of Delivery: 07/25/18 being seen today for ongoing management of a low-risk pregnancy.  Today she reports no complaints. Contractions: Irregular. Vag. Bleeding: None.  Movement: Present. denies leaking of fluid. Review of Systems:   Pertinent items are noted in HPI Denies abnormal vaginal discharge w/ itching/odor/irritation, headaches, visual changes, shortness of breath, chest pain, abdominal pain, severe nausea/vomiting, or problems with urination or bowel movements unless otherwise stated above. Pertinent History Reviewed:  Reviewed past medical,surgical, social, obstetrical and family history.  Reviewed problem list, medications and allergies. Physical Assessment:   Vitals:   07/12/18 1003  BP: (!) 135/91  Pulse: (!) 109  Weight: 196 lb (88.9 kg)  Body mass index is 29.8 kg/m.        Physical Examination:   General appearance: Well appearing, and in no distress  Mental status: Alert, oriented to person, place, and time  Skin: Warm & dry  Cardiovascular: Normal heart rate noted  Respiratory: Normal respiratory effort, no distress  Abdomen: Soft, gravid, nontender  Pelvic: Cervical exam performed       2/th/-3/vertex  Extremities: Edema: Trace  Fetal Status:     Movement: Present    Results for orders placed or performed in visit on 07/12/18 (from the past 24 hour(s))  POC Urinalysis Dipstick OB   Collection Time: 07/12/18 10:05 AM  Result Value Ref Range   Color, UA     Clarity, UA     Glucose, UA Negative Negative   Bilirubin, UA     Ketones, UA neg    Spec Grav, UA     Blood, UA trace    pH, UA     POC,PROTEIN,UA Negative Negative, Trace, Small (1+), Moderate (2+),  Large (3+), 4+   Urobilinogen, UA     Nitrite, UA neg    Leukocytes, UA Moderate (2+) (A) Negative   Appearance     Odor      Assessment & Plan:  1) Low-risk pregnancy G3P2002 at [redacted]w[redacted]d with an Estimated Date of Delivery: 07/25/18   2) SSE normal   Meds: No orders of the defined types were placed in this encounter.  Labs/procedures today:   Plan:  Continue routine obstetrical care BP remains borderline but not >thresholld for delivery plans  Reviewed: Term labor symptoms and general obstetric precautions including but not limited to vaginal bleeding, contractions, leaking of fluid and fetal movement were reviewed in detail with the patient.  All questions were answered  Follow-up: Return in about 3 days (around 07/15/2018) for LROB, short interval for BP status.  Orders Placed This Encounter  Procedures  . POC Urinalysis Dipstick OB   Amaryllis DykeLuther H   07/12/2018 10:21 AM

## 2018-07-12 NOTE — Progress Notes (Signed)
Thick yellow vaginal discharge with no odor.

## 2018-07-14 ENCOUNTER — Encounter (HOSPITAL_COMMUNITY): Payer: Self-pay

## 2018-07-14 ENCOUNTER — Inpatient Hospital Stay (HOSPITAL_COMMUNITY)
Admission: AD | Admit: 2018-07-14 | Discharge: 2018-07-14 | Disposition: A | Discharge status: Home / Self Care | Payer: Medicaid Other | Attending: Obstetrics & Gynecology | Admitting: Obstetrics & Gynecology

## 2018-07-14 ENCOUNTER — Other Ambulatory Visit: Payer: Self-pay

## 2018-07-14 ENCOUNTER — Inpatient Hospital Stay (HOSPITAL_COMMUNITY): Payer: Medicaid Other

## 2018-07-14 DIAGNOSIS — O99513 Diseases of the respiratory system complicating pregnancy, third trimester: Secondary | ICD-10-CM | POA: Diagnosis not present

## 2018-07-14 DIAGNOSIS — O26893 Other specified pregnancy related conditions, third trimester: Secondary | ICD-10-CM | POA: Insufficient documentation

## 2018-07-14 DIAGNOSIS — Z3A38 38 weeks gestation of pregnancy: Secondary | ICD-10-CM | POA: Insufficient documentation

## 2018-07-14 DIAGNOSIS — Z7982 Long term (current) use of aspirin: Secondary | ICD-10-CM | POA: Diagnosis not present

## 2018-07-14 DIAGNOSIS — R0602 Shortness of breath: Secondary | ICD-10-CM | POA: Diagnosis present

## 2018-07-14 DIAGNOSIS — R059 Cough, unspecified: Secondary | ICD-10-CM

## 2018-07-14 DIAGNOSIS — Z87891 Personal history of nicotine dependence: Secondary | ICD-10-CM | POA: Diagnosis not present

## 2018-07-14 DIAGNOSIS — R05 Cough: Secondary | ICD-10-CM

## 2018-07-14 DIAGNOSIS — R0989 Other specified symptoms and signs involving the circulatory and respiratory systems: Secondary | ICD-10-CM

## 2018-07-14 DIAGNOSIS — J069 Acute upper respiratory infection, unspecified: Secondary | ICD-10-CM

## 2018-07-14 LAB — URINALYSIS, ROUTINE W REFLEX MICROSCOPIC
Bilirubin Urine: NEGATIVE
Glucose, UA: NEGATIVE mg/dL
Hgb urine dipstick: NEGATIVE
Ketones, ur: NEGATIVE mg/dL
Nitrite: NEGATIVE
Protein, ur: NEGATIVE mg/dL
Specific Gravity, Urine: 1.009 (ref 1.005–1.030)
pH: 6 (ref 5.0–8.0)

## 2018-07-14 NOTE — MAU Note (Addendum)
Has been seen in the office 2X/week for HBP.  Also has been having irregular ctx for the past 2 weeks-today got more intense-hasn't timed them.  2 cm in the office.  Also has had an ongoing headache since 1300-took a flexeril with some relief.  States today she started feeling like she couldn't catch her breath after waking up from a nap.  No hx of any asthma or cardiac issues. States she has had a cough for a few days from a respiratory infection her kids have been passing around.

## 2018-07-14 NOTE — Discharge Instructions (Signed)

## 2018-07-14 NOTE — MAU Note (Signed)
Urine in lab 

## 2018-07-14 NOTE — MAU Provider Note (Addendum)
History     CSN: [id number]  Arrival date and time: 07/14/18 4098   First Provider Initiated Contact with Patient 07/14/18 2026      Chief Complaint  Patient presents with  . Shortness of Breath   HPI   Ms.Carla Rose is a 22 y.o. female G3P2002 @ [redacted]w[redacted]d here in MAU with non productive cough and shortness of breath. Says her chest has been heavy since 1 am and she feels some shortness of breath that comes and goes. No fever. No one in the home is sick. Says she has a very dry cough and runny nose. She is not taking anything for the cough. Says her BP has been elevated in the office for the past few visits. No fever.   OB History    Gravida  3   Para  2   Term  2   Preterm      AB      Living  2     SAB      TAB      Ectopic      Multiple      Live Births  2           Past Medical History:  Diagnosis Date  . Anemia   . Chlamydia   . Gonorrhea   . Hypertension   . Pregnancy induced hypertension     Past Surgical History:  Procedure Laterality Date  . NO PAST SURGERIES      Family History  Problem Relation Age of Onset  . Crohn's disease Father   . Crohn's disease Sister   . Heart disease Paternal Grandmother     Social History   Tobacco Use  . Smoking status: Former Smoker    Packs/day: 5.00    Types: Cigars  . Smokeless tobacco: Never Used  Substance Use Topics  . Alcohol use: No  . Drug use: No    Allergies: No Known Allergies  Medications Prior to Admission  Medication Sig Dispense Refill Last Dose  . acetaminophen (TYLENOL) 325 MG tablet Take 650 mg by mouth every 6 (six) hours as needed.   07/13/2018 at Unknown time  . aspirin 81 MG chewable tablet Chew 162 mg by mouth daily.   07/13/2018 at Unknown time  . cyclobenzaprine (FLEXERIL) 10 MG tablet Take 1 tablet (10 mg total) by mouth 2 (two) times daily as needed for muscle spasms. Use for migraines as well 20 tablet 0 07/14/2018 at 1030  . flintstones complete  (FLINTSTONES) 60 MG chewable tablet Chew 2 tablets by mouth daily.   07/13/2018 at Unknown time  . metroNIDAZOLE (FLAGYL) 500 MG tablet Take 1 tablet (500 mg total) by mouth 2 (two) times daily. 14 tablet 0 07/13/2018 at Unknown time  . nitrofurantoin, macrocrystal-monohydrate, (MACROBID) 100 MG capsule Take 1 capsule (100 mg total) by mouth 2 (two) times daily. X 7 days 14 capsule 0 07/13/2018 at Unknown time  . ferrous sulfate 325 (65 FE) MG tablet Take 1 tablet (325 mg total) by mouth 2 (two) times daily with a meal. 60 tablet 3 Taking   No results found for this or any previous visit (from the past 48 hour(s)).  Review of Systems  Constitutional: Positive for fatigue. Negative for fever.  HENT: Positive for congestion.   Eyes: Negative for photophobia.  Respiratory: Positive for cough, chest tightness and shortness of breath. Negative for wheezing.   Neurological: Negative for headaches.   Physical Exam   Blood pressure 133/90, pulse Marland Kitchen)  104, temperature 98 F (36.7 C), resp. rate 19, height 5\' 8"  (1.727 m), weight 90.3 kg, SpO2 98 %.   Patient Vitals for the past 24 hrs:  BP Temp Pulse Resp SpO2 Height Weight  07/14/18 2046 133/90 - (!) 104 - - - -  07/14/18 2038 120/85 - (!) 113 - - - -  07/14/18 1929 129/86 98 F (36.7 C) (!) 126 19 98 % 5\' 8"  (1.727 m) 90.3 kg   Physical Exam  Constitutional: She is oriented to person, place, and time. She appears well-developed and well-nourished.  Non-toxic appearance. She does not have a sickly appearance. She does not appear ill. No distress.  HENT:  Head: Normocephalic.  Eyes: Pupils are equal, round, and reactive to light.  Respiratory: Effort normal and breath sounds normal. No respiratory distress. She has no wheezes. She has no rales. She exhibits no tenderness.  GI: Soft.  Musculoskeletal: Normal range of motion.  Neurological: She is alert and oriented to person, place, and time.  Skin: Skin is warm. She is not diaphoretic.   Psychiatric: Her behavior is normal.   Fetal Tracing: Baseline: 125 bpm Variability: Moderate  Accelerations: 15x15 Decelerations: None Toco: Occasional   MAU Course  Procedures  None  MDM  HR elevated, patient without distress Lungs clear Chest xray ordered.  UA pending   Report given to Wynelle BourgeoisMarie Kaleb Linquist CNM who resumes care of the patient.   Assessment and Plan  Dg Chest 2 View  Result Date: 07/14/2018 CLINICAL DATA:  Shortness of breath after nap. Cough. Third trimester pregnancy. EXAM: CHEST - 2 VIEW COMPARISON:  None. FINDINGS: Cardiomediastinal silhouette is normal. No pleural effusions or focal consolidations. Trachea projects midline and there is no pneumothorax. Soft tissue planes and included osseous structures are non-suspicious. Abdominal shield. IMPRESSION: Normal chest. Electronically Signed   By: Awilda Metroourtnay  Bloomer M.D.   On: 07/14/2018 21:07   Reviewed with patient.  No concern for pneumonia Recommend routine cold care Followup in office Vitals:   07/14/18 2038 07/14/18 2046 07/14/18 2105 07/14/18 2139  BP: 120/85 133/90 130/87 121/87  Pulse: (!) 113 (!) 104 (!) 114 (!) 107  Resp:      Temp:      SpO2:   97%   Weight:      Height:       Discussed with Dr Despina HiddenEure.  He will follow her BPs in office  Encouraged to return here or to other Urgent Care/ED if she develops worsening of symptoms, increase in pain, fever, or other concerning symptoms.    Aviva SignsWilliams, Clelia Trabucco L, CNM

## 2018-07-15 ENCOUNTER — Ambulatory Visit (INDEPENDENT_AMBULATORY_CARE_PROVIDER_SITE_OTHER): Payer: Medicaid Other | Admitting: Obstetrics and Gynecology

## 2018-07-15 ENCOUNTER — Encounter: Payer: Self-pay | Admitting: Obstetrics and Gynecology

## 2018-07-15 VITALS — BP 120/77 | HR 99 | Wt 196.0 lb

## 2018-07-15 DIAGNOSIS — Z1389 Encounter for screening for other disorder: Secondary | ICD-10-CM

## 2018-07-15 DIAGNOSIS — Z3483 Encounter for supervision of other normal pregnancy, third trimester: Secondary | ICD-10-CM

## 2018-07-15 DIAGNOSIS — Z331 Pregnant state, incidental: Secondary | ICD-10-CM

## 2018-07-15 DIAGNOSIS — Z3A38 38 weeks gestation of pregnancy: Secondary | ICD-10-CM

## 2018-07-15 LAB — POCT URINALYSIS DIPSTICK OB
Blood, UA: NEGATIVE
Glucose, UA: NEGATIVE
Ketones, UA: NEGATIVE
Nitrite, UA: NEGATIVE
POC,PROTEIN,UA: NEGATIVE

## 2018-07-15 NOTE — Progress Notes (Addendum)
Patient ID: Carla Rose, female   DOB: 01/13/1996, 22 y.o.   MRN: [id number]    LOW-RISK PREGNANCY VISIT Patient name: Carla FontCayenne Hubble MRN [id number]  Date of birth: 05/03/1996 Chief Complaint:   Routine Prenatal Visit  History of Present Illness:   Carla FontCayenne Comunale is a 22 y.o. 523P2002 female at 6744w4d with an Estimated Date of Delivery: 07/25/18 being seen today for ongoing management of a low-risk pregnancy.[facility] and [facility]B but she has nasal congestion. She couldn't get her cervix checked.She has 21 yr old and 21 yr old. Wants Nexplanon after delivery. Per patient she says that she has had high BP related to pregnancy. Today she is normotensive. Today she reports no complaints. Contractions: Irregular. Vag. Bleeding: None.  Movement: Present. denies leaking of fluid. Review of Systems:   Pertinent items are noted in HPI Denies abnormal vaginal discharge w/ itching/odor/irritation, headaches, visual changes, shortness of breath, chest pain, abdominal pain, severe nausea/vomiting, or problems with urination or bowel movements unless otherwise stated above. Pertinent History Reviewed:  Reviewed past medical,surgical, social, obstetrical and family history.  Reviewed problem list, medications and allergies. Physical Assessment:   Vitals:   07/15/18 0910  BP: 120/77  Pulse: 99  Weight: 196 lb (88.9 kg)  Body mass index is 29.8 kg/m.        Physical Examination:   General appearance: Well appearing, and in no distress  Mental status: Alert, oriented to person, place, and time  Skin: Warm & dry  Cardiovascular: Normal heart rate noted  Respiratory: Normal respiratory effort, no distress  Abdomen: Soft, gravid, nontender  Pelvic: Cervical exam deferred         Extremities: Edema: Trace  Fetal Status: Fetal Heart Rate (bpm): 129 Fundal Height: 38 cm Movement: Present    Results for orders placed or performed in visit on 07/15/18 (from the past 24  hour(s))  POC Urinalysis Dipstick OB   Collection Time: 07/15/18  9:12 AM  Result Value Ref Range   Color, UA     Clarity, UA     Glucose, UA Negative Negative   Bilirubin, UA     Ketones, UA neg    Spec Grav, UA     Blood, UA neg    pH, UA     POC,PROTEIN,UA Negative Negative, Trace, Small (1+), Moderate (2+), Large (3+), 4+   Urobilinogen, UA     Nitrite, UA neg    Leukocytes, UA Small (1+) (A) Negative   Appearance     Odor    Results for orders placed or performed during the hospital encounter of 07/14/18 (from the past 24 hour(s))  Urinalysis, Routine w reflex microscopic   Collection Time: 07/14/18  7:45 PM  Result Value Ref Range   Color, Urine YELLOW YELLOW   APPearance CLEAR CLEAR   Specific Gravity, Urine 1.009 1.005 - 1.030   pH 6.0 5.0 - 8.0   Glucose, UA NEGATIVE NEGATIVE mg/dL   Hgb urine dipstick NEGATIVE NEGATIVE   Bilirubin Urine NEGATIVE NEGATIVE   Ketones, ur NEGATIVE NEGATIVE mg/dL   Protein, ur NEGATIVE NEGATIVE mg/dL   Nitrite NEGATIVE NEGATIVE   Leukocytes, UA MODERATE (A) NEGATIVE   RBC / HPF 0-5 0 - 5 RBC/hpf   WBC, UA 0-5 0 - 5 WBC/hpf   Bacteria, UA RARE (A) NONE SEEN   Squamous Epithelial / LPF 0-5 0 - 5   Mucus PRESENT     Assessment & Plan:  1) Low-risk pregnancy G3P2002 at  5432w4d with an Estimated Date of Delivery: 07/25/18    Meds: No orders of the defined types were placed in this encounter.  Labs/procedures today: None  Plan:   F/u in 1 week  Follow-up: Return in about 1 week (around 07/22/2018).  Orders Placed This Encounter  Procedures  . POC Urinalysis Dipstick OB   By signing my name below, I, Arnette NorrisMari Johnson, attest that this documentation has been prepared under the direction and in the presence of Tilda BurrowFerguson, Lyvia Mondesir V, MD. Electronically Signed: Arnette NorrisMari Johnson Medical Scribe. 07/15/18. 9:40 AM.  I personally performed the services described in this documentation, which was SCRIBED in my presence. The recorded information has  been reviewed and considered accurate. It has been edited as necessary during review. Tilda BurrowJohn V Endya Austin, MD

## 2018-07-18 ENCOUNTER — Encounter (HOSPITAL_COMMUNITY): Payer: Self-pay | Admitting: *Deleted

## 2018-07-18 ENCOUNTER — Inpatient Hospital Stay (HOSPITAL_COMMUNITY)
Admission: AD | Admit: 2018-07-18 | Discharge: 2018-07-20 | DRG: 807 | Disposition: A | Discharge status: Home / Self Care | Payer: Medicaid Other | Attending: Obstetrics & Gynecology | Admitting: Obstetrics & Gynecology

## 2018-07-18 ENCOUNTER — Inpatient Hospital Stay (HOSPITAL_COMMUNITY): Payer: Medicaid Other | Admitting: Anesthesiology

## 2018-07-18 DIAGNOSIS — Z3493 Encounter for supervision of normal pregnancy, unspecified, third trimester: Secondary | ICD-10-CM

## 2018-07-18 DIAGNOSIS — O165 Unspecified maternal hypertension, complicating the puerperium: Secondary | ICD-10-CM | POA: Diagnosis not present

## 2018-07-18 DIAGNOSIS — O9989 Other specified diseases and conditions complicating pregnancy, childbirth and the puerperium: Secondary | ICD-10-CM

## 2018-07-18 DIAGNOSIS — O99891 Other specified diseases and conditions complicating pregnancy: Secondary | ICD-10-CM

## 2018-07-18 DIAGNOSIS — Z87891 Personal history of nicotine dependence: Secondary | ICD-10-CM | POA: Diagnosis not present

## 2018-07-18 DIAGNOSIS — B951 Streptococcus, group B, as the cause of diseases classified elsewhere: Secondary | ICD-10-CM | POA: Diagnosis present

## 2018-07-18 DIAGNOSIS — Z880 Allergy status to penicillin: Secondary | ICD-10-CM | POA: Diagnosis not present

## 2018-07-18 DIAGNOSIS — Z3A39 39 weeks gestation of pregnancy: Secondary | ICD-10-CM | POA: Diagnosis not present

## 2018-07-18 DIAGNOSIS — G43019 Migraine without aura, intractable, without status migrainosus: Secondary | ICD-10-CM

## 2018-07-18 DIAGNOSIS — N760 Acute vaginitis: Secondary | ICD-10-CM

## 2018-07-18 DIAGNOSIS — O99824 Streptococcus B carrier state complicating childbirth: Secondary | ICD-10-CM | POA: Diagnosis present

## 2018-07-18 DIAGNOSIS — M549 Dorsalgia, unspecified: Secondary | ICD-10-CM

## 2018-07-18 DIAGNOSIS — B9689 Other specified bacterial agents as the cause of diseases classified elsewhere: Secondary | ICD-10-CM

## 2018-07-18 DIAGNOSIS — Z3483 Encounter for supervision of other normal pregnancy, third trimester: Secondary | ICD-10-CM | POA: Diagnosis present

## 2018-07-18 LAB — COMPREHENSIVE METABOLIC PANEL
ALT: 20 U/L (ref 0–44)
AST: 22 U/L (ref 15–41)
Albumin: 3 g/dL — ABNORMAL LOW (ref 3.5–5.0)
Alkaline Phosphatase: 281 U/L — ABNORMAL HIGH (ref 38–126)
Anion gap: 9 (ref 5–15)
BUN: 8 mg/dL (ref 6–20)
CHLORIDE: 107 mmol/L (ref 98–111)
CO2: 19 mmol/L — ABNORMAL LOW (ref 22–32)
Calcium: 8.6 mg/dL — ABNORMAL LOW (ref 8.9–10.3)
Creatinine, Ser: 0.54 mg/dL (ref 0.44–1.00)
GFR calc Af Amer: >60 mL/min (ref >60)
GFR calc non Af Amer: >60 mL/min (ref >60)
Glucose, Bld: 80 mg/dL (ref 70–99)
Potassium: 4.1 mmol/L (ref 3.5–5.1)
Sodium: 135 mmol/L (ref 135–145)
Total Bilirubin: 0.3 mg/dL (ref 0.3–1.2)
Total Protein: 6.6 g/dL (ref 6.5–8.1)

## 2018-07-18 LAB — CBC
HCT: 33.5 % — ABNORMAL LOW (ref 36.0–46.0)
Hemoglobin: 11.2 g/dL — ABNORMAL LOW (ref 12.0–15.0)
MCH: 29.5 pg (ref 26.0–34.0)
MCHC: 33.4 g/dL (ref 30.0–36.0)
MCV: 88.2 fL (ref 80.0–100.0)
Platelets: 185 10*3/uL (ref 150–400)
RBC: 3.8 MIL/uL — ABNORMAL LOW (ref 3.87–5.11)
RDW: 15.3 % (ref 11.5–15.5)
WBC: 10 10*3/uL (ref 4.0–10.5)
nRBC: 0 % (ref 0.0–0.2)

## 2018-07-18 LAB — PROTEIN / CREATININE RATIO, URINE
Creatinine, Urine: 51 mg/dL
PROTEIN CREATININE RATIO: 0.12 mg/mg{creat} (ref 0.00–0.15)
Total Protein, Urine: 6 mg/dL

## 2018-07-18 LAB — TYPE AND SCREEN
ABO/RH(D): O POS
Antibody Screen: NEGATIVE

## 2018-07-18 LAB — ABO/RH: ABO/RH(D): O POS

## 2018-07-18 MED ORDER — EPHEDRINE 5 MG/ML INJ
10.0000 mg | INTRAVENOUS | Status: DC | PRN
  Filled 2018-07-18: qty 2

## 2018-07-18 MED ORDER — EPHEDRINE 5 MG/ML INJ: 10.0000 mg | INTRAVENOUS | Status: DC | PRN

## 2018-07-18 MED ORDER — LACTATED RINGERS IV SOLN: 500.0000 mL | INTRAVENOUS | Status: DC | PRN

## 2018-07-18 MED ORDER — LACTATED RINGERS IV SOLN: 500.0000 mL | Freq: Once | INTRAVENOUS | Status: DC

## 2018-07-18 MED ORDER — OXYCODONE-ACETAMINOPHEN 5-325 MG PO TABS: 1.0000 | ORAL_TABLET | ORAL | Status: DC | PRN

## 2018-07-18 MED ORDER — FENTANYL CITRATE (PF) 100 MCG/2ML IJ SOLN
INTRAMUSCULAR | Status: DC | PRN
  Administered 2018-07-18 (×2): 50 ug via EPIDURAL

## 2018-07-18 MED ORDER — PHENYLEPHRINE 40 MCG/ML (10ML) SYRINGE FOR IV PUSH (FOR BLOOD PRESSURE SUPPORT)
80.0000 ug | PREFILLED_SYRINGE | INTRAVENOUS | Status: DC | PRN
  Filled 2018-07-18: qty 10

## 2018-07-18 MED ORDER — PHENYLEPHRINE 40 MCG/ML (10ML) SYRINGE FOR IV PUSH (FOR BLOOD PRESSURE SUPPORT): 80.0000 ug | PREFILLED_SYRINGE | INTRAVENOUS | Status: DC | PRN

## 2018-07-18 MED ORDER — SODIUM CHLORIDE 0.9 % IV SOLN
5.0000 10*6.[IU] | Freq: Once | INTRAVENOUS | Status: AC
  Administered 2018-07-18: 5 10*6.[IU] via INTRAVENOUS
  Filled 2018-07-18: qty 5

## 2018-07-18 MED ORDER — SOD CITRATE-CITRIC ACID 500-334 MG/5ML PO SOLN: 30.0000 mL | ORAL | Status: DC | PRN

## 2018-07-18 MED ORDER — OXYTOCIN BOLUS FROM INFUSION
500.0000 mL | Freq: Once | INTRAVENOUS | Status: AC
  Administered 2018-07-19: 500 mL via INTRAVENOUS

## 2018-07-18 MED ORDER — OXYCODONE-ACETAMINOPHEN 5-325 MG PO TABS: 2.0000 | ORAL_TABLET | ORAL | Status: DC | PRN

## 2018-07-18 MED ORDER — FENTANYL CITRATE (PF) 100 MCG/2ML IJ SOLN
INTRAMUSCULAR | Status: AC
  Filled 2018-07-18: qty 2

## 2018-07-18 MED ORDER — FENTANYL 2.5 MCG/ML BUPIVACAINE 1/10 % EPIDURAL INFUSION (WH - ANES)
INTRAMUSCULAR | Status: AC
  Filled 2018-07-18: qty 100

## 2018-07-18 MED ORDER — ACETAMINOPHEN 325 MG PO TABS
650.0000 mg | ORAL_TABLET | ORAL | Status: DC | PRN
  Administered 2018-07-19: 650 mg via ORAL
  Filled 2018-07-18: qty 2

## 2018-07-18 MED ORDER — LACTATED RINGERS IV SOLN
INTRAVENOUS | Status: DC
  Administered 2018-07-18 (×2): via INTRAVENOUS

## 2018-07-18 MED ORDER — DIPHENHYDRAMINE HCL 50 MG/ML IJ SOLN: 12.5000 mg | INTRAMUSCULAR | Status: DC | PRN

## 2018-07-18 MED ORDER — PENICILLIN G 3 MILLION UNITS IVPB - SIMPLE MED
3.0000 10*6.[IU] | INTRAVENOUS | Status: DC
  Filled 2018-07-18 (×4): qty 100

## 2018-07-18 MED ORDER — LIDOCAINE HCL (PF) 1 % IJ SOLN
30.0000 mL | INTRAMUSCULAR | Status: DC | PRN
  Filled 2018-07-18: qty 30

## 2018-07-18 MED ORDER — FENTANYL 2.5 MCG/ML BUPIVACAINE 1/10 % EPIDURAL INFUSION (WH - ANES)
14.0000 mL/h | INTRAMUSCULAR | Status: DC | PRN
  Administered 2018-07-18: 14 mL/h via EPIDURAL

## 2018-07-18 MED ORDER — ONDANSETRON HCL 4 MG/2ML IJ SOLN: 4.0000 mg | Freq: Four times a day (QID) | INTRAMUSCULAR | Status: DC | PRN

## 2018-07-18 MED ORDER — LIDOCAINE HCL (PF) 1 % IJ SOLN
INTRAMUSCULAR | Status: DC | PRN
  Administered 2018-07-18: 5 mL via EPIDURAL

## 2018-07-18 MED ORDER — SODIUM CHLORIDE 0.9 % IV SOLN
2.0000 g | Freq: Once | INTRAVENOUS | Status: AC
  Administered 2018-07-18: 2 g via INTRAVENOUS
  Filled 2018-07-18: qty 2

## 2018-07-18 MED ORDER — PHENYLEPHRINE 40 MCG/ML (10ML) SYRINGE FOR IV PUSH (FOR BLOOD PRESSURE SUPPORT)
PREFILLED_SYRINGE | INTRAVENOUS | Status: AC
  Filled 2018-07-18: qty 20

## 2018-07-18 MED ORDER — OXYTOCIN 40 UNITS IN LACTATED RINGERS INFUSION - SIMPLE MED
2.5000 [IU]/h | INTRAVENOUS | Status: DC
  Administered 2018-07-19: 2.5 [IU]/h via INTRAVENOUS
  Filled 2018-07-18: qty 1000

## 2018-07-18 NOTE — H&P (Addendum)
Maggie FontCayenne Lafata is a 22 y.o. female, G3P2002 at 539 weeks, presenting for contractions.  Patient states she started contracting last night and denies VB, LOF.  She endorses good fetal movement with m/m noted in the last hour.  Patient is anticipating a female infant "A'siah" and requests epidural for pain management.  Pregnancy and medical history significant as below.  Due to advanced dilation, ampicillin ordered for PCN prophylaxis and PIH labs obtained for self reported history of GHTN and elevated bp on admission.  Patient Active Problem List   Diagnosis Date Noted  . Indication for care in labor or delivery 07/18/2018  . Group beta Strep positive 07/18/2018  . Back pain affecting pregnancy 03/28/2018  . Migraine headache 03/28/2018  . Bacterial vaginitis 03/28/2018  . Supervision of normal pregnancy 02/25/2018  . Chlamydia 02/25/2018  . History of gestational hypertension 02/25/2018    History of present pregnancy:   [facility]  LAB RESULTS  Language English Pap 02/25/18: neg  Initiated care at 12wk GC/CT Initial:  -/+  POC: -/-     36wks:-/-  Dating by LMP c/w 1st trimester U/S 8wk    Support person  Genetics NT/IT: declined  AFP:         cfDNA:    [place]/HgbE normal  Flu vaccine Declined 04/08/18, given 06/03/18  CF declined  TDaP vaccine 06/03/18  SMA   Rhogam       Blood Type O/Positive/-- (05/22 0000)  Anatomy US Normal female A'siah Antibody Negative (05/22 0000)  Feeding Plan both HBsAg Negative (06/19 0000)  Contraception nexplanon RPR Non Reactive (07/07 1529)  Circumcision Yes, at FT--Patient reports No on admission 12/22 Rubella  Immune (05/22 0000)  Pediatrician List given HIV Non Reactive (07/07 1529)  Prenatal Classes declined      GTT/A1C Early:      26-28wks: 79/137/66  BTL Consent  GBS   positive      [ ]  PCN allergy  VBAC Consent n/a    Waterbirth [ ] Class [ ] Consent [ ] CNM visit PP Needs       Last evaluation:  07/15/2018 in office with Dr. Sherryl MangesJ. Ferguson.   OB  History    Gravida  3   Para  2   Term  2   Preterm      AB      Living  2     SAB      TAB      Ectopic      Multiple      Live Births  2          Past Medical History:  Diagnosis Date  . Anemia   . Chlamydia   . Gonorrhea   . Hypertension   . Pregnancy induced hypertension    Past Surgical History:  Procedure Laterality Date  . NO PAST SURGERIES     Family History: family history includes Crohn's disease in her father and sister; Heart disease in her paternal grandmother. Social History:  reports that she has quit smoking. Her smoking use included cigars. She smoked 5.00 packs per day. She has never used smokeless tobacco. She reports that she does not drink alcohol or use drugs.   Prenatal Transfer Tool  Maternal Diabetes: No Genetic Screening: Normal Maternal Ultrasounds/Referrals: Normal Fetal Ultrasounds or other Referrals:  None Maternal Substance Abuse:  No Significant Maternal Medications:  None Significant Maternal Lab Results: Lab values include: Group B Strep positive     Review of Systems  Constitutional: Negative  for fever.  Gastrointestinal: Negative for constipation and diarrhea.  Genitourinary: Negative for dysuria.  All other systems reviewed and are negative.  + Contractions, +FM -LOF, -VB  No Known Allergies   Dilation: 6 Effacement (%): 80 Station: -3 Exam by:: Janeth Rasehristina Robinson RN; Marvel PlanJessica Hashem RN Blood pressure 134/80, pulse (!) 117, temperature 98 F (36.7 C), temperature source Oral, resp. rate 20, SpO2 98 %.  Physical Exam  Constitutional: She is oriented to person, place, and time. She appears well-developed and well-nourished. No distress.  HENT:  Head: Normocephalic and atraumatic.  Eyes: Conjunctivae are normal.  Neck: Normal range of motion.  Cardiovascular: Normal rate, regular rhythm and normal heart sounds.  Respiratory: Effort normal and breath sounds normal.  GI: Soft. There is no abdominal  tenderness.  Musculoskeletal: Normal range of motion.        General: No edema.  Neurological: She is alert and oriented to person, place, and time.  Skin: Skin is warm and dry.  Psychiatric: She has a normal mood and affect. Her behavior is normal.    FHR: 125 bpm, Mod Var, -Decels, +Accels UCs:  Q1-974min, palpates moderate  Prenatal labs: ABO, Rh: O/Positive/-- (05/22 0000) Antibody: Negative (10/10 0850) Rubella:  Immune RPR: Non Reactive (10/10 0850)  HBsAg: Negative (06/19 0000)  HIV: Non Reactive (10/10 0850)  GBS:  Positive Sickle cell/Hgb electrophoresis:  Normal Pap:  NILM GC:  Neg Chlamydia:  Neg Other:  Varicella     Assessment IUP at 39 weeks Cat I FT Active Labor GBS Positive H/O GHTN  [facility] Rout[facility] Labor and Delivery Orders per Protocol In room to complete assessment and discuss POC: -Baseline PIH labs -Okay for Epidural -Start Ampicillin for GBS Dr. Marquis LunchU. Anyanwu  to be updated as appropriate  Joellyn QuailsJessica L EmlyCNM, MSN 07/18/2018, 6:07 PM

## 2018-07-18 NOTE — Plan of Care (Signed)
POC reviewed with pt understanding verbalized

## 2018-07-18 NOTE — Anesthesia Preprocedure Evaluation (Signed)
Anesthesia Evaluation  Patient identified by MRN, date of birth, ID band Patient awake    Reviewed: Allergy & Precautions, Patient's Chart, lab work & pertinent test results  Airway Mallampati: II       Dental no notable dental hx.    Pulmonary former smoker,    Pulmonary exam normal        Cardiovascular hypertension, Normal cardiovascular exam     Neuro/Psych  Headaches,    GI/Hepatic negative GI ROS, Neg liver ROS,   Endo/Other  negative endocrine ROS  Renal/GU negative Renal ROS     Musculoskeletal   Abdominal   Peds  Hematology   Anesthesia Other Findings   Reproductive/Obstetrics (+) Pregnancy                             Anesthesia Physical Anesthesia Plan  ASA: II  Anesthesia Plan: Epidural   Post-op Pain Management:    Induction:   PONV Risk Score and Plan:   Airway Management Planned: Natural Airway  Additional Equipment: None  Intra-op Plan:   Post-operative Plan:   Informed Consent: I have reviewed the patients History and Physical, chart, labs and discussed the procedure including the risks, benefits and alternatives for the proposed anesthesia with the patient or authorized representative who has indicated his/her understanding and acceptance.     Plan Discussed with:   Anesthesia Plan Comments: (Lab Results      Component                Value               Date                      WBC                      10.0                07/18/2018                HGB                      11.2 (L)            07/18/2018                HCT                      33.5 (L)            07/18/2018                MCV                      88.2                07/18/2018                PLT                      185                 07/18/2018           )        Anesthesia Quick Evaluation

## 2018-07-18 NOTE — Progress Notes (Signed)
Labor Progress Note Maggie FontCayenne Bonito is a 22 y.o. G3P2002 at 5552w0d presented for spontaneous onset of labor  S:  Patient feeling intermittent rectal pressure  O:  BP 117/84 (BP Location: Right Arm)   Pulse 90   Temp 97.7 F (36.5 C) (Oral)   Resp 20   SpO2 99%   Fetal Tracing:  Baseline: 120 Variability: moderate Accels: 15x15 Decels: none  Toco: 2-4  CVE: Dilation: 8.5 Effacement (%): 80 Cervical Position: Posterior Station: -1 Presentation: Vertex Exam by:: Aundria Rudogers RN   A&P: 22 y.o. [id number] 452w0d spontaneous onset of labor #Labor: Progressing well. Anticipate NSVD soon #Pain: epidural #FWB: Cat 1 #GBS positive  Rolm Bookbinderaroline M Emmitt Matthews, CNM 9:19 PM

## 2018-07-18 NOTE — Anesthesia Procedure Notes (Signed)
Epidural Patient location during procedure: OB Start time: 07/18/2018 6:25 PM End time: 07/18/2018 6:31 PM  Staffing Anesthesiologist: Shelton SilvasHollis, Khaya Theissen D, MD Performed: anesthesiologist   Preanesthetic Checklist Completed: patient identified, site marked, surgical consent, pre-op evaluation, timeout performed, IV checked, risks and benefits discussed and monitors and equipment checked  Epidural Patient position: sitting Prep: ChloraPrep Patient monitoring: heart rate, continuous pulse ox and blood pressure Approach: midline Location: L3-L4 Injection technique: LOR saline  Needle:  Needle type: Tuohy  Needle gauge: 17 G Needle length: 9 cm Catheter type: closed end flexible Catheter size: 20 Guage Test dose: negative and 1.5% lidocaine  Assessment Events: blood not aspirated, injection not painful, no injection resistance and no paresthesia  Additional Notes LOR @ 4.5 Patient identified. Risks/Benefits/Options discussed with patient including but not limited to bleeding, infection, nerve damage, paralysis, failed block, incomplete pain control, headache, blood pressure changes, nausea, vomiting, reactions to medications, itching and postpartum back pain. Confirmed with bedside nurse the patient's most recent platelet count. Confirmed with patient that they are not currently taking any anticoagulation, have any bleeding history or any family history of bleeding disorders. Patient expressed understanding and wished to proceed. All questions were answered. Sterile technique was used throughout the entire procedure. Please see nursing notes for vital signs. Test dose was given through epidural catheter and negative prior to continuing to dose epidural or start infusion. Warning signs of high block given to the patient including shortness of breath, tingling/numbness in hands, complete motor block, or any concerning symptoms with instructions to call for help. Patient was given instructions  on fall risk and not to get out of bed. All questions and concerns addressed with instructions to call with any issues or inadequate analgesia.    Reason for block:procedure for pain

## 2018-07-18 NOTE — MAU Note (Signed)
[name] is a 22 y.o. at 6545w0d here in MAU reporting: +contractions Onset of complaint: started yesterday and has gotten worse today Pain score: 7/10. Desires an epidural States was 2cm at last VE LOF: denies Vaginal bleeding:denies Vitals:   07/18/18 1736  BP: 134/80  Pulse: (!) 117  Resp: 20  Temp: 98 F (36.7 C)  SpO2: 98%     FHT: 130 Lab orders placed from triage: labor triage

## 2018-07-19 ENCOUNTER — Telehealth: Payer: Self-pay | Admitting: *Deleted

## 2018-07-19 ENCOUNTER — Encounter (HOSPITAL_COMMUNITY): Payer: Self-pay

## 2018-07-19 DIAGNOSIS — O99824 Streptococcus B carrier state complicating childbirth: Secondary | ICD-10-CM

## 2018-07-19 DIAGNOSIS — Z3A39 39 weeks gestation of pregnancy: Secondary | ICD-10-CM

## 2018-07-19 LAB — CBC
HCT: 31.1 % — ABNORMAL LOW (ref 36.0–46.0)
Hemoglobin: 10.3 g/dL — ABNORMAL LOW (ref 12.0–15.0)
MCH: 29.1 pg (ref 26.0–34.0)
MCHC: 33.1 g/dL (ref 30.0–36.0)
MCV: 87.9 fL (ref 80.0–100.0)
PLATELETS: 195 10*3/uL (ref 150–400)
RBC: 3.54 MIL/uL — AB (ref 3.87–5.11)
RDW: 15.3 % (ref 11.5–15.5)
WBC: 13 10*3/uL — ABNORMAL HIGH (ref 4.0–10.5)
nRBC: 0 % (ref 0.0–0.2)

## 2018-07-19 MED ORDER — DIBUCAINE 1 % RE OINT: 1.0000 "application " | TOPICAL_OINTMENT | RECTAL | Status: DC | PRN

## 2018-07-19 MED ORDER — ZOLPIDEM TARTRATE 5 MG PO TABS: 5.0000 mg | ORAL_TABLET | Freq: Every evening | ORAL | Status: DC | PRN

## 2018-07-19 MED ORDER — TETANUS-DIPHTH-ACELL PERTUSSIS 5-2.5-18.5 LF-MCG/0.5 IM SUSP: 0.5000 mL | Freq: Once | INTRAMUSCULAR | Status: DC

## 2018-07-19 MED ORDER — WITCH HAZEL-GLYCERIN EX PADS: 1.0000 "application " | MEDICATED_PAD | CUTANEOUS | Status: DC | PRN

## 2018-07-19 MED ORDER — ACETAMINOPHEN 325 MG PO TABS
650.0000 mg | ORAL_TABLET | ORAL | Status: DC | PRN
  Administered 2018-07-19: 650 mg via ORAL
  Filled 2018-07-19: qty 2

## 2018-07-19 MED ORDER — SIMETHICONE 80 MG PO CHEW: 80.0000 mg | CHEWABLE_TABLET | ORAL | Status: DC | PRN

## 2018-07-19 MED ORDER — COCONUT OIL OIL: 1.0000 "application " | TOPICAL_OIL | Status: DC | PRN

## 2018-07-19 MED ORDER — ONDANSETRON HCL 4 MG/2ML IJ SOLN: 4.0000 mg | INTRAMUSCULAR | Status: DC | PRN

## 2018-07-19 MED ORDER — PRENATAL MULTIVITAMIN CH
1.0000 | ORAL_TABLET | Freq: Every day | ORAL | Status: DC
  Administered 2018-07-19 – 2018-07-20 (×2): 1 via ORAL
  Filled 2018-07-19 (×2): qty 1

## 2018-07-19 MED ORDER — BENZOCAINE-MENTHOL 20-0.5 % EX AERO: 1.0000 "application " | INHALATION_SPRAY | CUTANEOUS | Status: DC | PRN

## 2018-07-19 MED ORDER — ONDANSETRON HCL 4 MG PO TABS: 4.0000 mg | ORAL_TABLET | ORAL | Status: DC | PRN

## 2018-07-19 MED ORDER — ERYTHROMYCIN 5 MG/GM OP OINT
TOPICAL_OINTMENT | OPHTHALMIC | Status: AC
  Filled 2018-07-19: qty 1

## 2018-07-19 MED ORDER — SENNOSIDES-DOCUSATE SODIUM 8.6-50 MG PO TABS
2.0000 | ORAL_TABLET | ORAL | Status: DC
  Administered 2018-07-19: 2 via ORAL
  Filled 2018-07-19: qty 2

## 2018-07-19 MED ORDER — IBUPROFEN 600 MG PO TABS
600.0000 mg | ORAL_TABLET | Freq: Four times a day (QID) | ORAL | Status: DC
  Administered 2018-07-19 – 2018-07-20 (×6): 600 mg via ORAL
  Filled 2018-07-19 (×6): qty 1

## 2018-07-19 MED ORDER — DIPHENHYDRAMINE HCL 25 MG PO CAPS: 25.0000 mg | ORAL_CAPSULE | Freq: Four times a day (QID) | ORAL | Status: DC | PRN

## 2018-07-19 NOTE — Anesthesia Postprocedure Evaluation (Signed)
Anesthesia Post Note  Patient: Maggie FontCayenne Candela  Procedure(s) Performed: AN AD HOC LABOR EPIDURAL     Patient location during evaluation: Mother Baby Anesthesia Type: Epidural Level of consciousness: awake and alert Pain management: pain level controlled Vital Signs Assessment: post-procedure vital signs reviewed and stable Respiratory status: spontaneous breathing, nonlabored ventilation and respiratory function stable Cardiovascular status: stable Postop Assessment: no headache, no backache, epidural receding, patient able to bend at knees, adequate PO intake and no apparent nausea or vomiting Anesthetic complications: no    Last Vitals:  Vitals:   07/19/18 0830 07/19/18 1313  BP: (!) 133/100 (!) 132/100  Pulse: 75 73  Resp:    Temp:    SpO2:      Last Pain:  Vitals:   07/19/18 1205  TempSrc:   PainSc: 7    Pain Goal: Patients Stated Pain Goal: 1 (07/19/18 0045)               Blythe Stanfordavid Adedayo Tysin Salada

## 2018-07-19 NOTE — Lactation Note (Signed)
This note was copied from a baby's chart. Lactation Consultation Note  Patient Name: Boy Maggie FontCayenne Smola ZOXWR'UToday's Date: 07/19/2018  Mom's choice for feeding on admission was breast and formula.   Mom has decided to exclusively formula feed.   Maternal Data    Feeding Feeding Type: Formula Nipple Type: Slow - flow  LATCH Score                   Interventions    Lactation Tools Discussed/Used     Consult Status      Huston FoleyMOULDEN, Alazar Cherian S 07/19/2018, 10:19 AM

## 2018-07-19 NOTE — Telephone Encounter (Signed)
LMOM for pt to call us back to schedule pp appointment.  07-19-18  AS 

## 2018-07-20 ENCOUNTER — Other Ambulatory Visit: Payer: Self-pay

## 2018-07-20 DIAGNOSIS — O165 Unspecified maternal hypertension, complicating the puerperium: Secondary | ICD-10-CM | POA: Diagnosis not present

## 2018-07-20 LAB — COMPREHENSIVE METABOLIC PANEL
ALBUMIN: 2.6 g/dL — AB (ref 3.5–5.0)
ALT: 20 U/L (ref 0–44)
AST: 21 U/L (ref 15–41)
Alkaline Phosphatase: 235 U/L — ABNORMAL HIGH (ref 38–126)
Anion gap: 8 (ref 5–15)
BUN: 10 mg/dL (ref 6–20)
CO2: 20 mmol/L — AB (ref 22–32)
Calcium: 8.1 mg/dL — ABNORMAL LOW (ref 8.9–10.3)
Chloride: 104 mmol/L (ref 98–111)
Creatinine, Ser: 0.55 mg/dL (ref 0.44–1.00)
GFR calc Af Amer: >60 mL/min (ref >60)
GFR calc non Af Amer: >60 mL/min (ref >60)
GLUCOSE: 89 mg/dL (ref 70–99)
Potassium: 3.7 mmol/L (ref 3.5–5.1)
SODIUM: 132 mmol/L — AB (ref 135–145)
Total Bilirubin: 0.4 mg/dL (ref 0.3–1.2)
Total Protein: 6.1 g/dL — ABNORMAL LOW (ref 6.5–8.1)

## 2018-07-20 LAB — CBC
HCT: 30.8 % — ABNORMAL LOW (ref 36.0–46.0)
Hemoglobin: 10.1 g/dL — ABNORMAL LOW (ref 12.0–15.0)
MCH: 29 pg (ref 26.0–34.0)
MCHC: 32.8 g/dL (ref 30.0–36.0)
MCV: 88.5 fL (ref 80.0–100.0)
Platelets: 146 10*3/uL — ABNORMAL LOW (ref 150–400)
RBC: 3.48 MIL/uL — ABNORMAL LOW (ref 3.87–5.11)
RDW: 15.4 % (ref 11.5–15.5)
WBC: 9.8 10*3/uL (ref 4.0–10.5)
nRBC: 0 % (ref 0.0–0.2)

## 2018-07-20 LAB — RPR: RPR Ser Ql: NONREACTIVE

## 2018-07-20 MED ORDER — NIFEDIPINE ER OSMOTIC RELEASE 30 MG PO TB24
30.0000 mg | ORAL_TABLET | Freq: Every day | ORAL | Status: DC
  Administered 2018-07-20: 30 mg via ORAL
  Filled 2018-07-20: qty 1

## 2018-07-20 MED ORDER — NIFEDIPINE ER 30 MG PO TB24: 30.0000 mg | ORAL_TABLET | Freq: Every day | ORAL | 1 refills | Status: DC

## 2018-07-20 MED ORDER — IBUPROFEN 600 MG PO TABS: 600.0000 mg | ORAL_TABLET | Freq: Four times a day (QID) | ORAL | 1 refills | Status: DC | PRN

## 2018-07-20 NOTE — Discharge Summary (Signed)
Postpartum Discharge Summary     Patient Name: Carla Rose DOB: 02/16/1996 MRN: [id number]  Date of admission: 07/18/2018 Delivering Provider: Rolm BookbinderNEILL, CAROLINE M   Date of discharge: 07/20/2018  Admitting diagnosis: 39 WKS, CTXS Intrauterine pregnancy: 6361w1d     Secondary diagnosis:  Principal Problem:   SVD (spontaneous vaginal delivery) Active Problems:   Indication for care in labor or delivery   Group beta Strep positive   Postpartum hypertension  Additional problems: None     Discharge diagnosis: Term Pregnancy Delivered and Postpartum Hypertension                                                                                                Post partum procedures:None  Augmentation: None  Complications: None  Hospital course:  Onset of Labor With Vaginal Delivery     22 y.o. yo G3P3003 at 6561w1d was admitted in Active Labor on 07/18/2018. Patient had an uncomplicated labor course as follows:  Membrane Rupture Time/Date: 11:58 PM ,07/19/2018   Intrapartum Procedures: Episiotomy: None [1]                                         Lacerations:  None [1]  Patient had a delivery of a Viable infant as below.  Delivery Note At 12:10 AM a viable female was delivered via Vaginal, Spontaneous (Presentation: LOA).  APGAR: 8, 9; weight pending.   Placenta status: spontaneous, intact.  Cord: 3 vessels  Anesthesia:  epidural Episiotomy: None Lacerations: None Suture Repair: n/a Est. Blood Loss (mL): 100  Mom to postpartum.  Baby to Couplet care / Skin to Skin.  Rolm BookbinderCaroline M Neill CNM 07/19/2018, 12:17 AM   Pateint had an uncomplicated postpartum course.  She is ambulating, tolerating a regular diet, passing flatus, and urinating well. Patient is discharged home in stable condition on 07/20/18.   Magnesium Sulfate recieved: No BMZ received: No  Physical exam  Vitals:   07/20/18 1005 07/20/18 1215 07/20/18 1402 07/20/18 1610  BP: (!) 126/92 (!) 131/96 112/73 (!)  123/92  Pulse: 90 100 86 (!) 101  Resp: 18 18    Temp:      TempSrc:      SpO2:   99%    General: alert, cooperative and no distress  Chest: Lungs CTA, Heart RRR Abdomen: Soft, Appropriately Tender, Non/Distended Lochia: appropriate Uterine Fundus: firm Incision: N/A DVT Evaluation: No evidence of DVT seen on physical exam. Calf/Ankle edema is present Labs: Lab Results  Component Value Date   WBC 9.8 07/20/2018   HGB 10.1 (L) 07/20/2018   HCT 30.8 (L) 07/20/2018   MCV 88.5 07/20/2018   PLT 146 (L) 07/20/2018   CMP Latest Ref Rng & Units 07/20/2018  Glucose 70 - 99 mg/dL 89  BUN 6 - 20 mg/dL 10  Creatinine 5.780.44 - 4.691.00 mg/dL 6.290.55  Sodium 528135 - 413145 mmol/L 132(L)  Potassium 3.5 - 5.1 mmol/L 3.7  Chloride 98 - 111 mmol/L 104  CO2 22 - 32 mmol/L 20(L)  Calcium 8.9 - 10.3 mg/dL 8.1(L)  Total Protein 6.5 - 8.1 g/dL 6.1(L)  Total Bilirubin 0.3 - 1.2 mg/dL 0.4  Alkaline Phos 38 - 126 U/L 235(H)  AST 15 - 41 U/L 21  ALT 0 - 44 U/L 20    Discharge instruction: per After Visit Summary and "Baby and Me Booklet".  After visit meds:  Allergies as of 07/20/2018   No Known Allergies     Medication List    STOP taking these medications   aspirin 81 MG chewable tablet   cyclobenzaprine 10 MG tablet Commonly known as:  FLEXERIL   ferrous sulfate 325 (65 FE) MG tablet     TAKE these medications   acetaminophen 325 MG tablet Commonly known as:  TYLENOL Take 650 mg by mouth every 6 (six) hours as needed.   flintstones complete 60 MG chewable tablet Chew 2 tablets by mouth daily.   ibuprofen 600 MG tablet Commonly known as:  ADVIL,MOTRIN Take 1 tablet (600 mg total) by mouth every 6 (six) hours as needed.   NIFEdipine 30 MG 24 hr tablet Commonly known as:  ADALAT CC Take 1 tablet (30 mg total) by mouth daily. Start taking on:  July 21, 2018       Diet: routine diet  Activity: Advance as tolerated. Pelvic rest for 6 weeks.   Outpatient follow up:1 week for  BP Check Follow up Appt:No future appointments. Follow up Visit: Follow-up Information    [facility] Follow up in 1 week(s).   Specialty:  Obstetrics and Gynecology Why:  Blood pressure check Contact information: [address] [place] [id number] [phone]           Please schedule this patient for Postpartum visit in: 1 week with the following provider: Any provider For C/S patients schedule nurse incision check in weeks 2 weeks: N/A Low risk pregnancy complicated by: H/O GHTN Delivery mode:  SVD Anticipated Birth Control:  Nexplanon PP Procedures needed: BP check  Schedule Integrated BH visit: no   Newborn Data: Live born female Birth Weight: 8 lb 6.6 oz (3815 g) APGAR: 9, 9  Newborn Delivery   Birth date/time:  07/19/2018 00:10:00 Delivery type:  Vaginal, Spontaneous     Baby Feeding: Bottle Disposition:home with mother   07/20/2018 Cherre RobinsJessica L Zakkiyya Barno, CNM

## 2018-07-20 NOTE — Progress Notes (Signed)
Carla Rose [id number]  Subjective: Nurse call and reports patient with elevated diastolic pressures.  In room to assess.  Patient denies HA, visual disturbances, SOB, or RUQ Pain. Denies complaints and requests discharge today.  No other questions or concerns.     Objective:  Vitals:   07/19/18 1313 07/19/18 1613 07/19/18 2119 07/20/18 0545  BP: (!) 132/100 (!) 127/93 (!) 131/97 (!) 120/94  Pulse: 73 69 90 84  Resp:  20 20 18   Temp:  97.6 F (36.4 C) 98.6 F (37 C) 97.8 F (36.6 C)  TempSrc:   Oral Oral  SpO2:   98% 98%   General: No apparent distress Extremities: No Edema present   Assessment: PPD #1 s/p SVD H/O GHTN Desires Discharge  Plan: -Dr. Vergie LivingPickens notified of patient status and advised: *Obtain CBC and CMP  *Start Procardia 30mg  xL  -Patient informed of POC and is agreeable -Discussed possibility of discharge if bp shows improvement on Procardia dosing -Will assess bp Q2H x 3 -Will send note for follow up in 1wk for BP check -Discharge pending and with consideration of infant discharge  Sabas SousJ. Kagan Mutchler, CNM 07/20/2018 9:49 AM .

## 2018-07-20 NOTE — Progress Notes (Signed)
This RN noted DBP's have been consistently over 90. This RN informed MD about BP's over past 24 hours.

## 2018-07-20 NOTE — Progress Notes (Signed)
POSTPARTUM PROGRESS NOTE  Post Partum Day 1  Subjective:  Carla Rose is a 22 y.o. [id number] s/p SVD at 5456w1d.  She reports she is doing well. No acute events overnight. She denies any problems with ambulating, voiding or po intake. Denies nausea or vomiting.  Pain is well controlled.  Lochia is appropriate.  Objective: Blood pressure (!) 120/94, pulse 84, temperature 97.8 F (36.6 C), temperature source Oral, resp. rate 18, SpO2 98 %, unknown if currently breastfeeding.  Physical Exam:  General: alert, cooperative and no distress Chest: no respiratory distress Heart:regular rate, distal pulses intact Abdomen: soft, nontender,  Uterine Fundus: firm, appropriately tender DVT Evaluation: No calf swelling or tenderness Extremities: No LE edema Skin: warm, dry  Recent Labs    07/18/18 1757 07/19/18 0616  HGB 11.2* 10.3*  HCT 33.5* 31.1*    Assessment/Plan: Carla Rose is a 22 y.o. [id number] s/p SVD at 6656w1d   PPD#1 - Doing well  Routine postpartum care H/o ghtn: BP well controlled.  Contraception: Nexplanon  Feeding: Both  Dispo: Plan for discharge PPD#2.   LOS: 2 days   Marcy Sirenatherine Ismail Graziani, D.O. OB Fellow  07/20/2018, 8:33 AM

## 2018-07-20 NOTE — Discharge Instructions (Signed)
Postpartum Hypertension  Postpartum hypertension is high blood pressure that remains higher than normal after childbirth. You may not realize that you have postpartum hypertension if your blood pressure is not being checked regularly. In most cases, postpartum hypertension will go away on its own, usually within a week of delivery. However, for some women, medical treatment is required to prevent serious complications, such as seizures or stroke.  What are the causes?  This condition may be caused by one or more of the following:   Hypertension that existed before pregnancy (chronic hypertension).   Hypertension that comes on as a result of pregnancy (gestational hypertension).   Hypertensive disorders during pregnancy (preeclampsia) or seizures in women who have high blood pressure during pregnancy (eclampsia).   A condition in which the liver, platelets, and red blood cells are damaged during pregnancy (HELLP syndrome).   A condition in which the thyroid produces too much hormones (hyperthyroidism).   Other rare problems of the nerves (neurological disorders) or blood disorders.  In some cases, the cause may not be known.  What increases the risk?  The following factors may make you more likely to develop this condition:   Chronic hypertension. In some cases, this may not have been diagnosed before pregnancy.   Obesity.   Type 2 diabetes.   Kidney disease.   History of preeclampsia or eclampsia.   Other medical conditions that change the level of hormones in the body (hormonal imbalance).  What are the signs or symptoms?  As with all types of hypertension, postpartum hypertension may not have any symptoms. Depending on how high your blood pressure is, you may experience:   Headaches. These may be mild, moderate, or severe. They may also be steady, constant, or sudden in onset (thunderclap headache).   Changes in your ability to see (visual changes).   Dizziness.   Shortness of breath.   Swelling  of your hands, feet, lower legs, or face. In some cases, you may have swelling in more than one of these locations.   Heart palpitations or a racing heartbeat.   Difficulty breathing while lying down.   Decrease in the amount of urine that you pass.  Other rare signs and symptoms may include:   Sweating more than usual. This lasts longer than a few days after delivery.   Chest pain.   Sudden dizziness when you get up from sitting or lying down.   Seizures.   Nausea or vomiting.   Abdominal pain.  How is this diagnosed?  This condition may be diagnosed based on the results of a physical exam, blood pressure measurements, and blood and urine tests.  You may also have other tests, such as a CT scan or an MRI, to check for other problems of postpartum hypertension.  How is this treated?  If blood pressure is high enough to require treatment, your options may include:   Medicines to reduce blood pressure (antihypertensives). Tell your health care provider if you are breastfeeding or if you plan to breastfeed. There are many antihypertensive medicines that are safe to take while breastfeeding.   Stopping medicines that may be causing hypertension.   Treating medical conditions that are causing hypertension.   Treating the complications of hypertension, such as seizures, stroke, or kidney problems.  Your health care provider will also continue to monitor your blood pressure closely until it is within a safe range for you.  Follow these instructions at home:   Take over-the-counter and prescription medicines only as   told by your health care provider.   Return to your normal activities as told by your health care provider. Ask your health care provider what activities are safe for you.   Do not use any products that contain nicotine or tobacco, such as cigarettes and e-cigarettes. If you need help quitting, ask your health care provider.   Keep all follow-up visits as told by your health care provider. This  is important.  Contact a health care provider if:   Your symptoms get worse.   You have new symptoms, such as:  ? A headache that does not get better.  ? Dizziness.  ? Visual changes.  Get help right away if:   You suddenly develop swelling in your hands, ankles, or face.   You have sudden, rapid weight gain.   You develop difficulty breathing, chest pain, racing heartbeat, or heart palpitations.   You develop severe pain in your abdomen.   You have any symptoms of a stroke. "BE FAST" is an easy way to remember the main warning signs of a stroke:  ? B - Balance. Signs are dizziness, sudden trouble walking, or loss of balance.  ? E - Eyes. Signs are trouble seeing or a sudden change in vision.  ? F - Face. Signs are sudden weakness or numbness of the face, or the face or eyelid drooping on one side.  ? A - Arms. Signs are weakness or numbness in an arm. This happens suddenly and usually on one side of the body.  ? S - Speech. Signs are sudden trouble speaking, slurred speech, or trouble understanding what people say.  ? T - Time. Time to call emergency services. Write down what time symptoms started.   You have other signs of a stroke, such as:  ? A sudden, severe headache with no known cause.  ? Nausea or vomiting.  ? Seizure.  These symptoms may represent a serious problem that is an emergency. Do not wait to see if the symptoms will go away. Get medical help right away. Call your local emergency services (911 in the [place] Do not drive yourself to the hospital.  Summary   Postpartum hypertension is high blood pressure that remains higher than normal after childbirth.   In most cases, postpartum hypertension will go away on its own, usually within a week of delivery.   For some women, medical treatment is required to prevent serious complications, such as seizures or stroke.  This information is not intended to replace advice given to you by your health care provider. Make sure you discuss any questions  you have with your health care provider.  Document Released: 03/17/2014 Document Revised: 05/04/2017 Document Reviewed: 05/04/2017  Elsevier Interactive Patient Education  2019 [employer]

## 2018-07-22 ENCOUNTER — Encounter: Payer: Medicaid Other | Admitting: Women's Health

## 2018-07-22 ENCOUNTER — Telehealth: Payer: Self-pay | Admitting: *Deleted

## 2018-07-22 NOTE — Telephone Encounter (Signed)
Left message on voice mail for pt to call us back to set up pp appointment.  07-22-18  AS

## 2018-07-23 ENCOUNTER — Telehealth: Payer: Self-pay | Admitting: *Deleted

## 2018-07-23 NOTE — Telephone Encounter (Signed)
Lmom for pt to call us back to schedule pp appt.  07-23-18  AS

## 2018-07-26 ENCOUNTER — Ambulatory Visit: Payer: Medicaid Other

## 2018-07-26 VITALS — BP 140/102 | HR 78 | Ht 68.0 in | Wt 183.6 lb

## 2018-07-26 DIAGNOSIS — Z0130 Encounter for examination of blood pressure without abnormal findings: Secondary | ICD-10-CM

## 2018-07-26 NOTE — Progress Notes (Addendum)
Pt here for blood pressure check 140/102 pulse 78. For got take blood pressure medicine.Spoke kim booker about reading. Advise take medication in morning and have blood pressure check within a hour. Having headache and face flush, after taking medication.Advised take medication again,sometime it time get into system.Pt understand.Pad CMA

## 2018-07-27 ENCOUNTER — Ambulatory Visit: Payer: Medicaid Other | Admitting: *Deleted

## 2018-07-27 ENCOUNTER — Ambulatory Visit (INDEPENDENT_AMBULATORY_CARE_PROVIDER_SITE_OTHER): Payer: Medicaid Other | Admitting: Obstetrics and Gynecology

## 2018-07-27 ENCOUNTER — Encounter: Payer: Self-pay | Admitting: Obstetrics and Gynecology

## 2018-07-27 VITALS — BP 156/92 | HR 118 | Ht 68.0 in | Wt 184.0 lb

## 2018-07-27 VITALS — BP 156/96 | HR 118 | Ht 68.0 in | Wt 184.0 lb

## 2018-07-27 DIAGNOSIS — O165 Unspecified maternal hypertension, complicating the puerperium: Secondary | ICD-10-CM | POA: Diagnosis not present

## 2018-07-27 DIAGNOSIS — Z0130 Encounter for examination of blood pressure without abnormal findings: Secondary | ICD-10-CM

## 2018-07-27 MED ORDER — LISINOPRIL 10 MG PO TABS: 10.0000 mg | ORAL_TABLET | Freq: Every day | ORAL | 0 refills | Status: DC

## 2018-07-27 MED ORDER — HYDROCHLOROTHIAZIDE 12.5 MG PO CAPS: 12.5000 mg | ORAL_CAPSULE | Freq: Every day | ORAL | 0 refills | Status: DC

## 2018-07-27 NOTE — Progress Notes (Signed)
Patient ID: Carla Rose, female   DOB: 12/13/1995, 22 y.o.   MRN: [id number]    [facility]  @DATE @            Patient name: Carla Rose MRN [id number]  Date of birth: 08/24/1995  CC & HPI:  Carla Rose is a 22 y.o. female presenting today for headaches with face flush after taking BP medication (nifedipine 30 mg daily). Says she get hot and legs turn "splotchy", BP has been up since yesterday. Is not checking her pressure at home.  After recent delivery is bottle feeding her child  ROS:  ROS +headaches after medications + facial redness + feet edema +elevated BP -fever -chills   Pertinent History Reviewed:   Reviewed:  Medical         Past Medical History:  Diagnosis Date  . Anemia   . Chlamydia   . Gonorrhea   . Hypertension   . Pregnancy induced hypertension                               Surgical Hx:    Past Surgical History:  Procedure Laterality Date  . NO PAST SURGERIES     Medications: Reviewed & Updated - see associated section                       Current Outpatient Medications:  .  acetaminophen (TYLENOL) 325 MG tablet, Take 650 mg by mouth every 6 (six) hours as needed., Disp: , Rfl:  .  flintstones complete (FLINTSTONES) 60 MG chewable tablet, Chew 2 tablets by mouth daily., Disp: , Rfl:  .  ibuprofen (ADVIL,MOTRIN) 600 MG tablet, Take 1 tablet (600 mg total) by mouth every 6 (six) hours as needed., Disp: 30 tablet, Rfl: 1 .  NIFEdipine (ADALAT CC) 30 MG 24 hr tablet, Take 1 tablet (30 mg total) by mouth daily., Disp: 14 tablet, Rfl: 1   Social History: Reviewed -  reports that she has quit smoking. Her smoking use included cigars. She smoked 5.00 packs per day. She has never used smokeless tobacco.  Objective Findings:  Vitals: Blood pressure (!) 156/96, pulse (!) 118, height 5\' 8"  (1.727 m), weight 184 lb (83.5 kg), not currently breastfeeding.  PHYSICAL EXAMINATION General appearance - alert, well appearing, and in no  distress, Reflex 1+ no headaches Mental status - alert, oriented to person, place, and time, normal mood, behavior, speech, dress, motor activity, and thought processes, affect appropriate to mood Chest - not examined Heart - not examined Abdomen - not examined Skin - erythema in face legs and feet CMP Latest Ref Rng & Units 07/20/2018 07/18/2018 03/28/2018  Glucose 70 - 99 mg/dL 89 80 86  BUN 6 - 20 mg/dL 10 8 6   Creatinine 0.44 - 1.00 mg/dL 7.820.55 9.560.54 2.13(Y0.43(L)  Sodium 135 - 145 mmol/L 132(L) 135 130(L)  Potassium 3.5 - 5.1 mmol/L 3.7 4.1 3.2(L)  Chloride 98 - 111 mmol/L 104 107 100  CO2 22 - 32 mmol/L 20(L) 19(L) 19(L)  Calcium 8.9 - 10.3 mg/dL 8.1(L) 8.6(L) 8.3(L)  Total Protein 6.5 - 8.1 g/dL 6.1(L) 6.6 6.9  Total Bilirubin 0.3 - 1.2 mg/dL 0.4 0.3 0.6  Alkaline Phos 38 - 126 U/L 235(H) 281(H) 133(H)  AST 15 - 41 U/L 21 22 20   ALT 0 - 44 U/L 20 20 15    CBC    Component Value Date/Time   WBC  9.8 07/20/2018 0933   RBC 3.48 (L) 07/20/2018 0933   HGB 10.1 (L) 07/20/2018 0933   HGB 10.1 (L) 05/06/2018 0850   HCT 30.8 (L) 07/20/2018 0933   HCT 29.7 (L) 05/06/2018 0850   PLT 146 (L) 07/20/2018 0933   PLT 180 05/06/2018 0850   MCV 88.5 07/20/2018 0933   MCV 86 05/06/2018 0850   MCH 29.0 07/20/2018 0933   MCHC 32.8 07/20/2018 0933   RDW 15.4 07/20/2018 0933   RDW 14.2 05/06/2018 0850   LYMPHSABS 0.9 03/28/2018 0035   MONOABS 0.3 03/28/2018 0035   EOSABS 0.0 03/28/2018 0035   BASOSABS 0.0 03/28/2018 0035    PELVIC NOT DONE  Assessment & Plan:   A:  1.  Chronic HTN/ postpartum HTN  P:  1. Lisinopril 10 mg disp 30 ref 0 2. HCTZ, 10 days 12.5 mg  3. F/u 1 week BP check, PRN if worsening   By signing my name below, I, Arnette NorrisMari Johnson, attest that this documentation has been prepared under the direction and in the presence of Tilda BurrowFerguson, Coralynn Gaona V, MD. Electronically Signed: Arnette NorrisMari Johnson Medical Scribe. 07/27/18. 10:40 AM.  I personally performed the services described in this  documentation, which was SCRIBED in my presence. The recorded information has been reviewed and considered accurate. It has been edited as necessary during review. Tilda BurrowJohn V Meagen Limones, MD

## 2018-07-30 ENCOUNTER — Ambulatory Visit: Payer: Medicaid Other | Admitting: Obstetrics and Gynecology

## 2018-08-05 ENCOUNTER — Ambulatory Visit: Payer: Medicaid Other

## 2018-08-05 VITALS — BP 108/75 | HR 85 | Ht 68.0 in | Wt 180.4 lb

## 2018-08-05 DIAGNOSIS — Z0130 Encounter for examination of blood pressure without abnormal findings: Secondary | ICD-10-CM

## 2018-08-05 NOTE — Progress Notes (Signed)
Pt here for blood pressure check 108/75 pulse 85. Spoke Carla Rose about reading. Okay, stop taking Lisinopril two days before pp visit on 08-24-18. Pad CMA

## 2018-08-24 ENCOUNTER — Ambulatory Visit (INDEPENDENT_AMBULATORY_CARE_PROVIDER_SITE_OTHER): Payer: Medicaid Other | Admitting: Women's Health

## 2018-08-24 ENCOUNTER — Encounter: Payer: Self-pay | Admitting: Women's Health

## 2018-08-24 DIAGNOSIS — O165 Unspecified maternal hypertension, complicating the puerperium: Secondary | ICD-10-CM

## 2018-08-24 NOTE — Patient Instructions (Signed)
NO SEX UNTIL AFTER YOU GET YOUR BIRTH CONTROL   Etonogestrel implant What is this medicine? ETONOGESTREL (et oh noe JES trel) is a contraceptive (birth control) device. It is used to prevent pregnancy. It can be used for up to 3 years. This medicine may be used for other purposes; ask your health care provider or pharmacist if you have questions. COMMON BRAND NAME(S): Implanon, Nexplanon What should I tell my health care provider before I take this medicine? They need to know if you have any of these conditions: -abnormal vaginal bleeding -blood vessel disease or blood clots -breast, cervical, endometrial, ovarian, liver, or uterine cancer -diabetes -gallbladder disease -heart disease or recent heart attack -high blood pressure -high cholesterol or triglycerides -kidney disease -liver disease -migraine headaches -seizures -stroke -tobacco smoker -an unusual or allergic reaction to etonogestrel, anesthetics or antiseptics, other medicines, foods, dyes, or preservatives -pregnant or trying to get pregnant -breast-feeding How should I use this medicine? This device is inserted just under the skin on the inner side of your upper arm by a health care professional. Talk to your pediatrician regarding the use of this medicine in children. Special care may be needed. Overdosage: If you think you have taken too much of this medicine contact a poison control center or emergency room at once. NOTE: This medicine is only for you. Do not share this medicine with others. What if I miss a dose? This does not apply. What may interact with this medicine? Do not take this medicine with any of the following medications: -amprenavir -fosamprenavir This medicine may also interact with the following medications: -acitretin -aprepitant -armodafinil -bexarotene -bosentan -carbamazepine -certain medicines for fungal infections like fluconazole, ketoconazole, itraconazole and voriconazole -certain  medicines to treat hepatitis, HIV or AIDS -cyclosporine -felbamate -griseofulvin -lamotrigine -modafinil -oxcarbazepine -phenobarbital -phenytoin -primidone -rifabutin -rifampin -rifapentine -St. John's wort -topiramate This list may not describe all possible interactions. Give your health care provider a list of all the medicines, herbs, non-prescription drugs, or dietary supplements you use. Also tell them if you smoke, drink alcohol, or use illegal drugs. Some items may interact with your medicine. What should I watch for while using this medicine? This product does not protect you against HIV infection (AIDS) or other sexually transmitted diseases. You should be able to feel the implant by pressing your fingertips over the skin where it was inserted. Contact your doctor if you cannot feel the implant, and use a non-hormonal birth control method (such as condoms) until your doctor confirms that the implant is in place. Contact your doctor if you think that the implant may have broken or become bent while in your arm. You will receive a user card from your health care provider after the implant is inserted. The card is a record of the location of the implant in your upper arm and when it should be removed. Keep this card with your health records. What side effects may I notice from receiving this medicine? Side effects that you should report to your doctor or health care professional as soon as possible: -allergic reactions like skin rash, itching or hives, swelling of the face, lips, or tongue -breast lumps, breast tissue changes, or discharge -breathing problems -changes in emotions or moods -if you feel that the implant may have broken or bent while in your arm -high blood pressure -pain, irritation, swelling, or bruising at the insertion site -scar at site of insertion -signs of infection at the insertion site such as fever, and skin redness,  pain or discharge -signs and symptoms  of a blood clot such as breathing problems; changes in vision; chest pain; severe, sudden headache; pain, swelling, warmth in the leg; trouble speaking; sudden numbness or weakness of the face, arm or leg -signs and symptoms of liver injury like dark yellow or brown urine; general ill feeling or flu-like symptoms; light-colored stools; loss of appetite; nausea; right upper belly pain; unusually weak or tired; yellowing of the eyes or skin -unusual vaginal bleeding, discharge Side effects that usually do not require medical attention (report to your doctor or health care professional if they continue or are bothersome): -acne -breast pain or tenderness -headache -irregular menstrual bleeding -nausea This list may not describe all possible side effects. Call your doctor for medical advice about side effects. You may report side effects to FDA at 1-800-FDA-1088. Where should I keep my medicine? This drug is given in a hospital or clinic and will not be stored at home. NOTE: This sheet is a summary. It may not cover all possible information. If you have questions about this medicine, talk to your doctor, pharmacist, or health care provider.  2019 Elsevier/Gold Standard (2017-06-02 14:11:42)

## 2018-08-24 NOTE — Progress Notes (Signed)
   POSTPARTUM VISIT Patient name: Carla Rose MRN [id number]  Date of birth: 1996-01-30 Chief Complaint:   Postpartum Care  History of Present Illness:   Carla Rose is a 23 y.o. G48P3003 Caucasian female being seen today for a postpartum visit. She is 4 weeks postpartum following a spontaneous vaginal delivery at 39.1 gestational weeks. Anesthesia: epidural. Laceration: none. I have fully reviewed the prenatal and intrapartum course. Pregnancy uncomplicated. Postpartum course has been complicated by PPHTN- d/c'd from hospital on procardia, 1wk postpartum was still elevated- switched to lisinopril 10/hctz 12.5. Bleeding no bleeding. Bowel function is normal. Bladder function is normal.  Patient is not sexually active. Last sexual activity: prior to birth of baby.  Contraception method is wants Nexplanon.  [place] Postpartum Depression Screening: negative. Score 0.   Last pap 02/25/18.  Results were normal .  No LMP recorded.  Baby's course has been complicated by 3d hospitalization just recently for RSV. Baby is feeding by bottle.  Review of Systems:   Pertinent items are noted in HPI Denies Abnormal vaginal discharge w/ itching/odor/irritation, headaches, visual changes, shortness of breath, chest pain, abdominal pain, severe nausea/vomiting, or problems with urination or bowel movements. Pertinent History Reviewed:  Reviewed past medical,surgical, obstetrical and family history.  Reviewed problem list, medications and allergies. OB History  Gravida Para Term Preterm AB Living  3 3 3     3   SAB TAB Ectopic Multiple Live Births        0 3    # Outcome Date GA Lbr Len/2nd Weight Sex Delivery Anes PTL Lv  3 Term 07/19/18 [redacted]w[redacted]d 07:01 / 00:09 8 lb 6.6 oz (3.815 kg) M Vag-Spont EPI  LIV  2 Term 01/09/17 [redacted]w[redacted]d  7 lb 4 oz (3.289 kg) F Vag-Spont EPI N LIV     Complications: Gestational hypertension  1 Term 01/17/14 [redacted]w[redacted]d  7 lb 3 oz (3.26 kg) M Vag-Spont EPI N LIV   Physical Assessment:    Vitals:   08/24/18 1057  BP: 120/84  Pulse: 87  Weight: 182 lb 6.4 oz (82.7 kg)  Height: 5\' 8"  (1.727 m)  Body mass index is 27.73 kg/m.       Physical Examination:   General appearance: alert, well appearing, and in no distress  Mental status: alert, oriented to person, place, and time  Skin: warm & dry   Cardiovascular: normal heart rate noted   Respiratory: normal respiratory effort, no distress   Breasts: deferred, no complaints   Abdomen: soft, non-tender   Pelvic: VULVA: normal appearing vulva with no masses, tenderness or lesions, UTERUS: uterus is normal size, shape, consistency and nontender  Rectal: no hemorrhoids  Extremities: no edema       No results found for this or any previous visit (from the past 24 hour(s)).  Assessment & Plan:  1) Postpartum exam 2) 4 wks s/p SVB 3) Bottlefeeding 4) Depression screening 5) Contraception counseling, pt prefers abstinence until Nexplanon, order today  6) Resolved PPHTN  Meds: No orders of the defined types were placed in this encounter.   Follow-up: Return in about 3 weeks (around 09/14/2018) for Nexplanon , order today.   No orders of the defined types were placed in this encounter.   Cheral Marker CNM, [facility] 08/24/2018 11:15 AM

## 2018-09-14 ENCOUNTER — Ambulatory Visit (INDEPENDENT_AMBULATORY_CARE_PROVIDER_SITE_OTHER): Payer: Medicaid Other | Admitting: Women's Health

## 2018-09-14 ENCOUNTER — Encounter: Payer: Self-pay | Admitting: Women's Health

## 2018-09-14 VITALS — BP 121/81 | HR 80 | Ht 68.0 in | Wt 185.6 lb

## 2018-09-14 DIAGNOSIS — Z3202 Encounter for pregnancy test, result negative: Secondary | ICD-10-CM

## 2018-09-14 DIAGNOSIS — Z30017 Encounter for initial prescription of implantable subdermal contraceptive: Secondary | ICD-10-CM | POA: Insufficient documentation

## 2018-09-14 DIAGNOSIS — Z3049 Encounter for surveillance of other contraceptives: Secondary | ICD-10-CM | POA: Diagnosis not present

## 2018-09-14 LAB — POCT URINE PREGNANCY: Preg Test, Ur: NEGATIVE

## 2018-09-14 MED ORDER — ETONOGESTREL 68 MG ~~LOC~~ IMPL
68.0000 mg | DRUG_IMPLANT | Freq: Once | SUBCUTANEOUS | Status: AC
  Administered 2018-09-14: 68 mg via SUBCUTANEOUS

## 2018-09-14 NOTE — Addendum Note (Signed)
Addended by: Federico Flake A on: 09/14/2018 01:34 PM   Modules accepted: Orders

## 2018-09-14 NOTE — Patient Instructions (Signed)
Keep the area clean and dry.  You can remove the big bandage in 24 hours, and the small steri-strip bandage in 3-5 days.  A back up method, such as condoms, should be used for two weeks. You may have irregular vaginal bleeding for the first 6 months after the Nexplanon is placed, then the bleeding usually lightens and it is possible that you may not have any periods.  If you have any concerns, please give us a call.    Etonogestrel implant What is this medicine? ETONOGESTREL (et oh noe JES trel) is a contraceptive (birth control) device. It is used to prevent pregnancy. It can be used for up to 3 years. This medicine may be used for other purposes; ask your health care provider or pharmacist if you have questions. COMMON BRAND NAME(S): Implanon, Nexplanon What should I tell my health care provider before I take this medicine? They need to know if you have any of these conditions: -abnormal vaginal bleeding -blood vessel disease or blood clots -breast, cervical, endometrial, ovarian, liver, or uterine cancer -diabetes -gallbladder disease -heart disease or recent heart attack -high blood pressure -high cholesterol or triglycerides -kidney disease -liver disease -migraine headaches -seizures -stroke -tobacco smoker -an unusual or allergic reaction to etonogestrel, anesthetics or antiseptics, other medicines, foods, dyes, or preservatives -pregnant or trying to get pregnant -breast-feeding How should I use this medicine? This device is inserted just under the skin on the inner side of your upper arm by a health care professional. Talk to your pediatrician regarding the use of this medicine in children. Special care may be needed. Overdosage: If you think you have taken too much of this medicine contact a poison control center or emergency room at once. NOTE: This medicine is only for you. Do not share this medicine with others. What if I miss a dose? This does not apply. What may  interact with this medicine? Do not take this medicine with any of the following medications: -amprenavir -fosamprenavir This medicine may also interact with the following medications: -acitretin -aprepitant -armodafinil -bexarotene -bosentan -carbamazepine -certain medicines for fungal infections like fluconazole, ketoconazole, itraconazole and voriconazole -certain medicines to treat hepatitis, HIV or AIDS -cyclosporine -felbamate -griseofulvin -lamotrigine -modafinil -oxcarbazepine -phenobarbital -phenytoin -primidone -rifabutin -rifampin -rifapentine -St. John's wort -topiramate This list may not describe all possible interactions. Give your health care provider a list of all the medicines, herbs, non-prescription drugs, or dietary supplements you use. Also tell them if you smoke, drink alcohol, or use illegal drugs. Some items may interact with your medicine. What should I watch for while using this medicine? This product does not protect you against HIV infection (AIDS) or other sexually transmitted diseases. You should be able to feel the implant by pressing your fingertips over the skin where it was inserted. Contact your doctor if you cannot feel the implant, and use a non-hormonal birth control method (such as condoms) until your doctor confirms that the implant is in place. Contact your doctor if you think that the implant may have broken or become bent while in your arm. You will receive a user card from your health care provider after the implant is inserted. The card is a record of the location of the implant in your upper arm and when it should be removed. Keep this card with your health records. What side effects may I notice from receiving this medicine? Side effects that you should report to your doctor or health care professional as soon as possible: -allergic   reactions like skin rash, itching or hives, swelling of the face, lips, or tongue -breast lumps, breast  tissue changes, or discharge -breathing problems -changes in emotions or moods -if you feel that the implant may have broken or bent while in your arm -high blood pressure -pain, irritation, swelling, or bruising at the insertion site -scar at site of insertion -signs of infection at the insertion site such as fever, and skin redness, pain or discharge -signs and symptoms of a blood clot such as breathing problems; changes in vision; chest pain; severe, sudden headache; pain, swelling, warmth in the leg; trouble speaking; sudden numbness or weakness of the face, arm or leg -signs and symptoms of liver injury like dark yellow or brown urine; general ill feeling or flu-like symptoms; light-colored stools; loss of appetite; nausea; right upper belly pain; unusually weak or tired; yellowing of the eyes or skin -unusual vaginal bleeding, discharge Side effects that usually do not require medical attention (report to your doctor or health care professional if they continue or are bothersome): -acne -breast pain or tenderness -headache -irregular menstrual bleeding -nausea This list may not describe all possible side effects. Call your doctor for medical advice about side effects. You may report side effects to FDA at 1-800-FDA-1088. Where should I keep my medicine? This drug is given in a hospital or clinic and will not be stored at home. NOTE: This sheet is a summary. It may not cover all possible information. If you have questions about this medicine, talk to your doctor, pharmacist, or health care provider.  2019 Elsevier/Gold Standard (2017-06-02 14:11:42)  

## 2018-09-14 NOTE — Progress Notes (Signed)
   NEXPLANON INSERTION Patient name: Carla Rose MRN [id number]  Date of birth: Aug 15, 1995 Subjective Findings:   Carla Rose is a 23 y.o. G66P3003 Caucasian female being seen today for insertion of a Nexplanon.   Patient's last menstrual period was 09/02/2018. Last sexual intercourse was prior to birth of baby Last pap8/1/19. Results were:  normal  Risks/benefits/side effects of Nexplanon have been discussed and her questions have been answered.  Specifically, a failure rate of 07/998 has been reported, with an increased failure rate if pt takes St. John's Wort and/or antiseizure medicaitons.  She is aware of the common side effect of irregular bleeding, which the incidence of decreases over time. Signed copy of informed consent in chart.  Pertinent History Reviewed:   Reviewed past medical,surgical, social, obstetrical and family history.  Reviewed problem list, medications and allergies. Objective Findings & Procedure:    Vitals:   09/14/18 1156  BP: 121/81  Pulse: 80  Weight: 185 lb 9.6 oz (84.2 kg)  Height: 5\' 8"  (1.727 m)  Body mass index is 28.22 kg/m.  Results for orders placed or performed in visit on 09/14/18 (from the past 24 hour(s))  POCT urine pregnancy   Collection Time: 09/14/18 12:02 PM  Result Value Ref Range   Preg Test, Ur Negative Negative     Time out was performed.  She is right-handed, so her left arm, approximately 10cm from the medial epicondyle and 3-5cm posterior to the sulcus, was cleansed with alcohol and anesthetized with 2cc of 2% Lidocaine.  The area was cleansed again with betadine and the Nexplanon was inserted per manufacturer's recommendations without difficulty.  3 steri-strips and pressure bandage were applied. The patient tolerated the procedure well.  Assessment & Plan:   1) Nexplanon insertion Pt was instructed to keep the area clean and dry, remove pressure bandage in 24 hours, and keep insertion site covered with the steri-strip  for 3-5 days.  Back up contraception was recommended for 2 weeks.  She was given a card indicating date Nexplanon was inserted and date it needs to be removed. Follow-up PRN problems.  Orders Placed This Encounter  Procedures  . POCT urine pregnancy    Follow-up: Return in about 1 year (around 09/15/2019) for Physical.  Cheral Marker CNM, [facility] 09/14/2018 12:29 PM

## 2019-02-21 ENCOUNTER — Encounter (HOSPITAL_COMMUNITY): Payer: Self-pay | Admitting: Emergency Medicine

## 2019-02-21 ENCOUNTER — Other Ambulatory Visit: Payer: Self-pay

## 2019-02-21 ENCOUNTER — Emergency Department (HOSPITAL_COMMUNITY): Payer: Self-pay

## 2019-02-21 ENCOUNTER — Emergency Department (HOSPITAL_COMMUNITY)
Admission: EM | Admit: 2019-02-21 | Discharge: 2019-02-21 | Disposition: A | Discharge status: Home / Self Care | Payer: Self-pay | Attending: Emergency Medicine | Admitting: Emergency Medicine

## 2019-02-21 DIAGNOSIS — Z3202 Encounter for pregnancy test, result negative: Secondary | ICD-10-CM | POA: Insufficient documentation

## 2019-02-21 DIAGNOSIS — Z79899 Other long term (current) drug therapy: Secondary | ICD-10-CM | POA: Insufficient documentation

## 2019-02-21 DIAGNOSIS — I10 Essential (primary) hypertension: Secondary | ICD-10-CM | POA: Insufficient documentation

## 2019-02-21 DIAGNOSIS — K6389 Other specified diseases of intestine: Secondary | ICD-10-CM | POA: Insufficient documentation

## 2019-02-21 DIAGNOSIS — Z87891 Personal history of nicotine dependence: Secondary | ICD-10-CM | POA: Insufficient documentation

## 2019-02-21 LAB — BASIC METABOLIC PANEL
Anion gap: 10 (ref 5–15)
BUN: 10 mg/dL (ref 6–20)
CO2: 26 mmol/L (ref 22–32)
Calcium: 9.7 mg/dL (ref 8.9–10.3)
Chloride: 103 mmol/L (ref 98–111)
Creatinine, Ser: 0.62 mg/dL (ref 0.44–1.00)
GFR calc Af Amer: >60 mL/min (ref >60)
GFR calc non Af Amer: >60 mL/min (ref >60)
Glucose, Bld: 97 mg/dL (ref 70–99)
Potassium: 3.7 mmol/L (ref 3.5–5.1)
Sodium: 139 mmol/L (ref 135–145)

## 2019-02-21 LAB — URINALYSIS, ROUTINE W REFLEX MICROSCOPIC
Bilirubin Urine: NEGATIVE
Glucose, UA: NEGATIVE mg/dL
Hgb urine dipstick: NEGATIVE
Ketones, ur: NEGATIVE mg/dL
Nitrite: NEGATIVE
Protein, ur: NEGATIVE mg/dL
Specific Gravity, Urine: 1.027 (ref 1.005–1.030)
pH: 6 (ref 5.0–8.0)

## 2019-02-21 LAB — PREGNANCY, URINE: Preg Test, Ur: NEGATIVE

## 2019-02-21 MED ORDER — PREDNISONE 20 MG PO TABS
40.0000 mg | ORAL_TABLET | Freq: Once | ORAL | Status: AC
  Administered 2019-02-21: 40 mg via ORAL
  Filled 2019-02-21: qty 2

## 2019-02-21 MED ORDER — IOHEXOL 300 MG/ML  SOLN
100.0000 mL | Freq: Once | INTRAMUSCULAR | Status: AC | PRN
  Administered 2019-02-21: 100 mL via INTRAVENOUS

## 2019-02-21 MED ORDER — METRONIDAZOLE 500 MG PO TABS
500.0000 mg | ORAL_TABLET | Freq: Once | ORAL | Status: AC
  Administered 2019-02-21: 500 mg via ORAL
  Filled 2019-02-21: qty 1

## 2019-02-21 MED ORDER — MELOXICAM 15 MG PO TABS: 15.0000 mg | ORAL_TABLET | Freq: Every day | ORAL | 0 refills | Status: DC

## 2019-02-21 MED ORDER — NAPROXEN 250 MG PO TABS
500.0000 mg | ORAL_TABLET | Freq: Once | ORAL | Status: AC
  Administered 2019-02-21: 500 mg via ORAL
  Filled 2019-02-21: qty 2

## 2019-02-21 MED ORDER — ONDANSETRON HCL 4 MG PO TABS
4.0000 mg | ORAL_TABLET | Freq: Once | ORAL | Status: AC
  Administered 2019-02-21: 4 mg via ORAL
  Filled 2019-02-21: qty 1

## 2019-02-21 MED ORDER — ACETAMINOPHEN 500 MG PO TABS
1000.0000 mg | ORAL_TABLET | Freq: Once | ORAL | Status: AC
  Administered 2019-02-21: 1000 mg via ORAL
  Filled 2019-02-21: qty 2

## 2019-02-21 MED ORDER — METRONIDAZOLE 500 MG PO TABS: 500.0000 mg | ORAL_TABLET | Freq: Two times a day (BID) | ORAL | 0 refills | Status: DC

## 2019-02-21 MED ORDER — DEXAMETHASONE 4 MG PO TABS: 4.0000 mg | ORAL_TABLET | Freq: Two times a day (BID) | ORAL | 0 refills | Status: DC

## 2019-02-21 NOTE — Discharge Instructions (Addendum)
Your vital signs are within normal limits.  Your oxygen level is 100% on room air.  Within normal limits by my interpretation.  Your urine test questions an early urinary tract infection.  A culture has been sent to the lab.  You will be given instructions for antibiotic medications to use if it comes back positive.  The CT scan shows an area of inflammation on the left side of your colon.  This condition is called epiploic appendagitis.  Please use Decadron and metronidazole 2 times daily with a meal.  Please do not take these medications on an empty stomach.  Please do not drink alcohol of any kind while taking the metronidazole.  Do not use mouthwash with alcohol in it while taking the metronidazole.  Please use meloxicam 1 daily.  Use Tylenol extra strength every 4 hours as needed for abdominal discomfort.  Please call Dr. Oneida Alar for a GI evaluation of your area of inflammation and lower abdomen pain as soon as possible.

## 2019-02-21 NOTE — ED Provider Notes (Signed)
[facility] Provider Note   CSN: [id number] Arrival date & time: 02/21/19  1345     History   Chief Complaint Chief Complaint  Patient presents with  . Abdominal Pain    HPI Carla Rose is a 23 y.o. female.     Patient is a 23 year old female who presents to the emergency department with a complaint of left abdomen pain.  The patient states this problem is been going on for about 3 days.  She says sometimes it feels as though it hurts when she takes a breath.  She is not had any injury to the area.  She has not had problems with constipation or diarrhea.  No blood in the stools.  No fever, no chills reported.  Patient denies doing any heavy lifting, pushing, or straining involving these abdominal areas.  It is of note that 5 to 6 months ago the patient had an ovarian cyst, and she says that in some ways this pain feels similar.  Her last menstrual cycle was 2 to 3 months ago.  The patient states that she has an implant for birth control and her periods are irregular.  The patient's last bowel movement was this morning.  She says that it was constipated, but she has a history of chronic constipation.  Nothing seems to make the pain any better, and nothing really seems to make it any worse.  The history is provided by the patient.  Abdominal Pain Associated symptoms: no chest pain, no constipation, no cough, no diarrhea, no dysuria, no hematuria, no nausea, no shortness of breath and no vomiting     Past Medical History:  Diagnosis Date  . Anemia   . Chlamydia   . Gonorrhea   . Hypertension   . Pregnancy induced hypertension     Patient Active Problem List   Diagnosis Date Noted  . Nexplanon insertion 09/14/2018  . Postpartum hypertension 07/20/2018  . Migraine headache 03/28/2018  . Bacterial vaginitis 03/28/2018  . Chlamydia 02/25/2018  . History of gestational hypertension 02/25/2018    Past Surgical History:  Procedure Laterality Date  .  NO PAST SURGERIES       OB History    Gravida  3   Para  3   Term  3   Preterm      AB      Living  3     SAB      TAB      Ectopic      Multiple  0   Live Births  3            Home Medications    Prior to Admission medications   Medication Sig Start Date End Date Taking? Authorizing Provider  acetaminophen (TYLENOL) 325 MG tablet Take 650 mg by mouth every 6 (six) hours as needed.    [provider]  flintstones complete (FLINTSTONES) 60 MG chewable tablet Chew 2 tablets by mouth daily.    [provider]  hydrochlorothiazide (MICROZIDE) 12.5 MG capsule Take 1 capsule (12.5 mg total) by mouth daily. Patient not taking: Reported on 08/24/2018 07/27/18   Tilda BurrowFerguson, John V, MD  ibuprofen (ADVIL,MOTRIN) 600 MG tablet Take 1 tablet (600 mg total) by mouth every 6 (six) hours as needed. Patient not taking: Reported on 08/24/2018 07/20/18   Gerrit HeckEmly, Jessica, CNM  lisinopril (ZESTRIL) 10 MG tablet Take 1 tablet (10 mg total) by mouth daily. Patient not taking: Reported on 08/24/2018 07/27/18   Christin BachFerguson, John  V, MD    Family History Family History  Problem Relation Age of Onset  . Crohn's disease Father   . Crohn's disease Sister   . Heart disease Paternal Grandmother     Social History Social History   Tobacco Use  . Smoking status: Former Smoker    Packs/day: 5.00    Types: Cigars  . Smokeless tobacco: Never Used  Substance Use Topics  . Alcohol use: No  . Drug use: No     Allergies   Patient has no known allergies.   Review of Systems Review of Systems  Constitutional: Negative for activity change and appetite change.  HENT: Negative for congestion, ear discharge, ear pain, facial swelling, nosebleeds, rhinorrhea, sneezing and tinnitus.   Eyes: Negative for photophobia, pain and discharge.  Respiratory: Negative for cough, choking, shortness of breath and wheezing.   Cardiovascular: Negative for chest pain, palpitations and leg  swelling.  Gastrointestinal: Positive for abdominal pain. Negative for blood in stool, constipation, diarrhea, nausea and vomiting.  Genitourinary: Negative for difficulty urinating, dysuria, flank pain, frequency and hematuria.  Musculoskeletal: Negative for back pain, gait problem, myalgias and neck pain.  Skin: Negative for color change, rash and wound.  Neurological: Negative for dizziness, seizures, syncope, facial asymmetry, speech difficulty, weakness and numbness.  Hematological: Negative for adenopathy. Does not bruise/bleed easily.  Psychiatric/Behavioral: Negative for agitation, confusion, hallucinations, self-injury and suicidal ideas. The patient is not nervous/anxious.      Physical Exam Updated Vital Signs BP (!) 135/95 (BP Location: Right Arm)   Pulse 66   Temp 98.3 F (36.8 C) (Oral)   Resp 18   Ht 5\' 8"  (1.727 m)   Wt 84.2 kg   SpO2 97%   BMI 28.22 kg/m   Physical Exam Vitals signs and nursing note reviewed.  Constitutional:      Appearance: She is well-developed. She is not toxic-appearing.  HENT:     Head: Normocephalic.     Right Ear: Tympanic membrane and external ear normal.     Left Ear: Tympanic membrane and external ear normal.  Eyes:     General: Lids are normal.     Pupils: Pupils are equal, round, and reactive to light.  Neck:     Musculoskeletal: Normal range of motion and neck supple.     Vascular: No carotid bruit.  Cardiovascular:     Rate and Rhythm: Normal rate and regular rhythm.     Pulses: Normal pulses.     Heart sounds: Normal heart sounds.  Pulmonary:     Effort: No respiratory distress.     Breath sounds: Normal breath sounds.  Abdominal:     General: Bowel sounds are normal.     Palpations: Abdomen is soft.     Tenderness: There is abdominal tenderness in the suprapubic area and left lower quadrant. There is no right CVA tenderness, left CVA tenderness or guarding.  Musculoskeletal: Normal range of motion.  Lymphadenopathy:      Head:     Right side of head: No submandibular adenopathy.     Left side of head: No submandibular adenopathy.     Cervical: No cervical adenopathy.  Skin:    General: Skin is warm and dry.  Neurological:     Mental Status: She is alert and oriented to person, place, and time.     Cranial Nerves: No cranial nerve deficit.     Sensory: No sensory deficit.  Psychiatric:        Speech: Speech  normal.      ED Treatments / Results  Labs (all labs ordered are listed, but only abnormal results are displayed) Labs Reviewed  URINALYSIS, ROUTINE W REFLEX MICROSCOPIC - Abnormal; Notable for the following components:      Result Value   APPearance CLOUDY (*)    Leukocytes,Ua LARGE (*)    Bacteria, UA RARE (*)    All other components within normal limits  PREGNANCY, URINE  BASIC METABOLIC PANEL    EKG None  Radiology No results found.  Procedures Procedures (including critical care time)  Medications Ordered in ED Medications - No data to display   Initial Impression / Assessment and Plan / ED Course  I have reviewed the triage vital signs and the nursing notes.  Pertinent labs & imaging results that were available during my care of the patient were reviewed by me and considered in my medical decision making (see chart for details).          Final Clinical Impressions(s) / ED Diagnoses MDM Vital signs reviewed.  Pulse oximetry is 99% on room air.  Within normal limits by my interpretation.  Patient complains of left abdomen pain.  Mild discomfort with flexing of the so as.  No CVA tenderness appreciated. Urine pregnancy test is negative.  The basic metabolic panel is well within normal limits.  The urine analysis shows a cloudy yellow specimen with a specific gravity of 1.027.  There is a large leukocyte esterase, 11-20 white blood cells, rare bacteria.  A culture has been sent to the lab.  CT abdomen shows thickening of the walls of the focal segment of the  lower descending colon with associated pericolonic inflammation and stranding.  These findings are compatible with colitis, but probably more compatible with epiploic appendagitis.  The appendix is normal, and there is no bowel obstruction.  There is no renal or ureteral calculi, and there is no abscess appreciated.  I discussed these findings with the patient in terms which he understands.  The patient will be started on metronidazole and Decadron.  The patient is also placed on Mobic daily.  Patient is referred to Dr. Trinda Pascal for GI evaluation and management. 7:28 PM.  Notified by the CT radiology technician that the patient will have to have a basic metabolic panel.  The patient has a history of hypertension in her previous records.   Final diagnoses:  Epiploic appendagitis    ED Discharge Orders         Ordered    metroNIDAZOLE (FLAGYL) 500 MG tablet  2 times daily     02/21/19 2205    dexamethasone (DECADRON) 4 MG tablet  2 times daily with meals     02/21/19 2205    meloxicam (MOBIC) 15 MG tablet  Daily     02/21/19 2205           Lily Kocher, PA-C 02/21/19 2218    Virgel Manifold, MD 02/22/19 [phone]

## 2019-02-21 NOTE — ED Notes (Signed)
Patient transported to CT 

## 2019-02-21 NOTE — ED Triage Notes (Signed)
Pt reports occasional sharp llq pain x 3 days. Reports hx of ovarian cyst.

## 2019-02-23 LAB — URINE CULTURE
Culture: <10000 — AB
Special Requests: NORMAL

## 2019-05-01 IMAGING — CR DG CHEST 2V
2 series · 2 of 2 positions shown · non-contrast
Comparison: None.

CLINICAL DATA: Shortness of breath after nap. Cough. Third
trimester pregnancy.

EXAM:
CHEST - 2 VIEW

[chest pa]
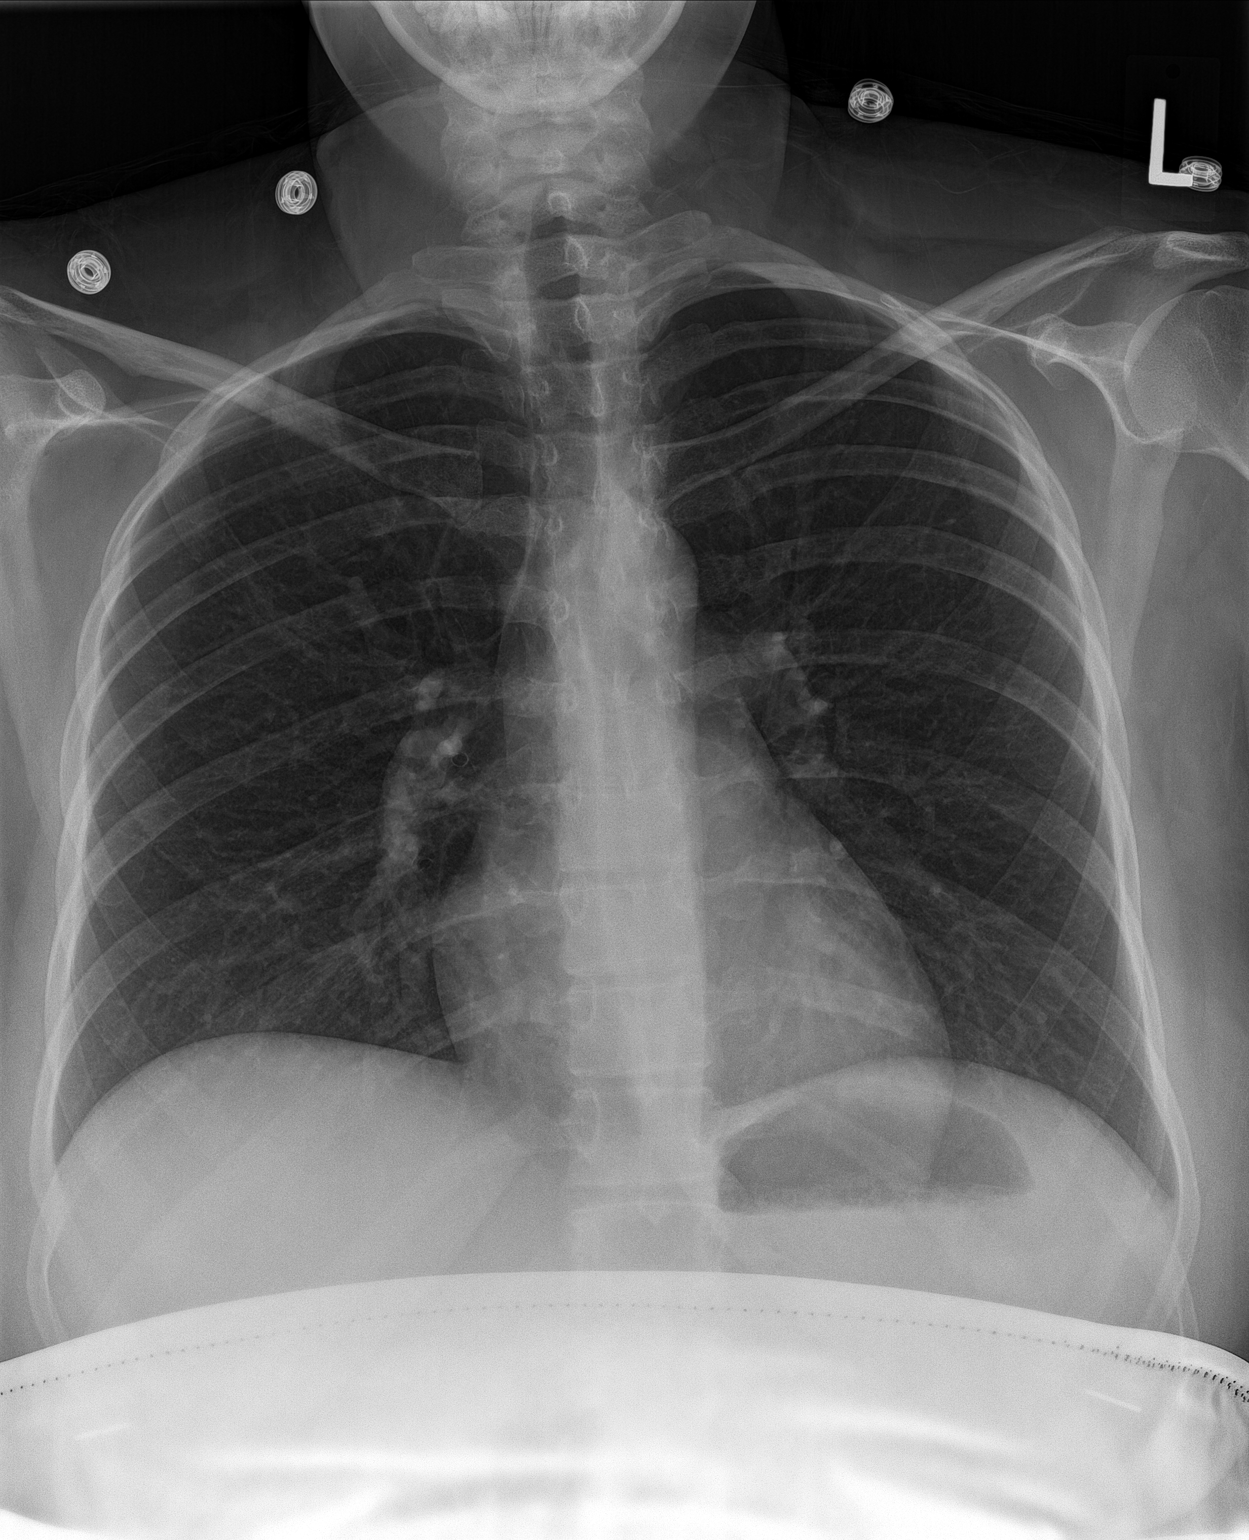

[chest lat]
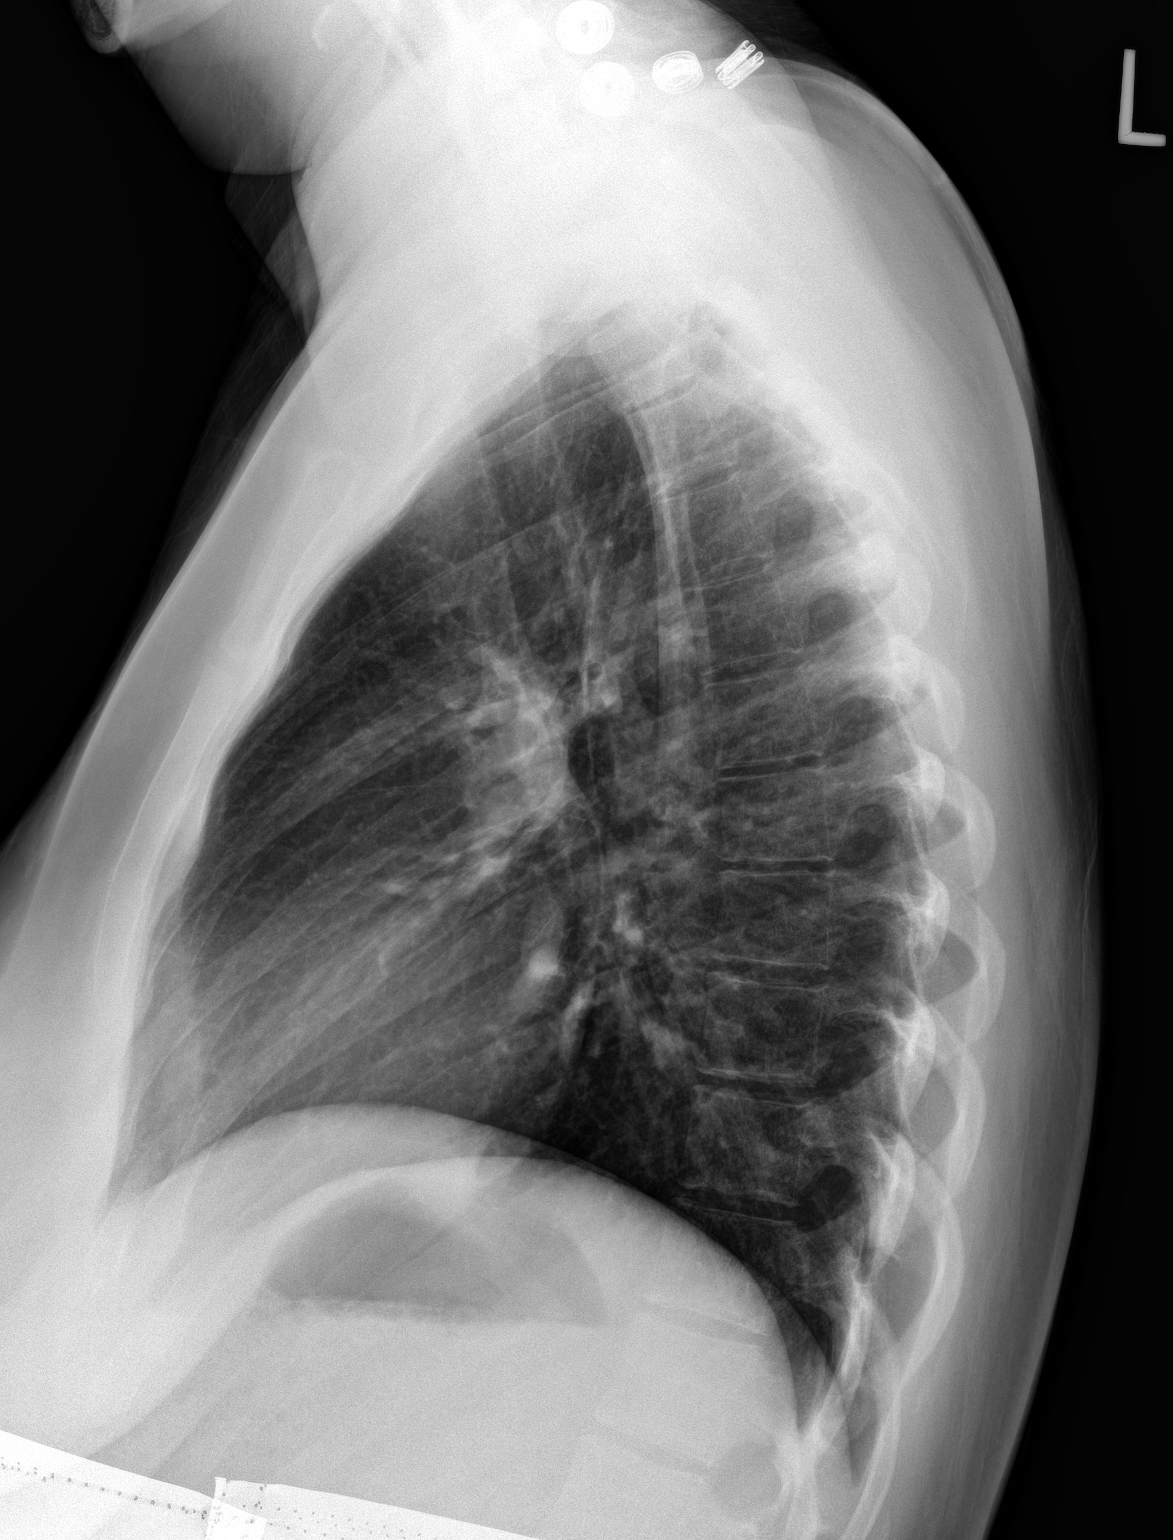

[2 of 2 positions shown; findings below may reference images not displayed]

FINDINGS: Cardiomediastinal silhouette is normal. No pleural effusions or
focal consolidations. Trachea projects midline and there is no
pneumothorax. Soft tissue planes and included osseous structures are
non-suspicious. Abdominal shield.
IMPRESSION: Normal chest.

## 2019-12-09 IMAGING — CT CT ABDOMEN AND PELVIS WITH CONTRAST
2 of 3 series · 16 of 46 positions shown, 18 images · IV contrast (Isovue)
Comparison: None.

CLINICAL DATA: LEFT lower quadrant abdominal pain.

EXAM:
CT ABDOMEN AND PELVIS WITH CONTRAST
TECHNIQUE: Multidetector CT imaging of the abdomen and pelvis was performed
using the standard protocol following bolus administration of
intravenous contrast.
CONTRAST:  100mL OMNIPAQUE IOHEXOL 300 MG/ML  SOLN

[Series 2: axial st · axial · 0.98mm/px · z∈[+1099,+1504]mm · 13 of 95 slices shown, 15 images]
[im 7/95  soft-tissue]
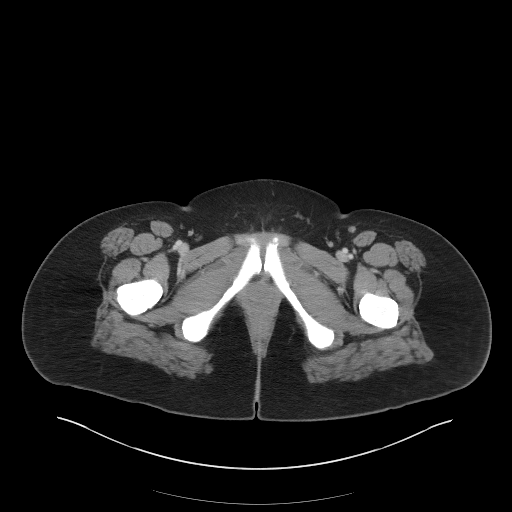
[im 7/95  bone]
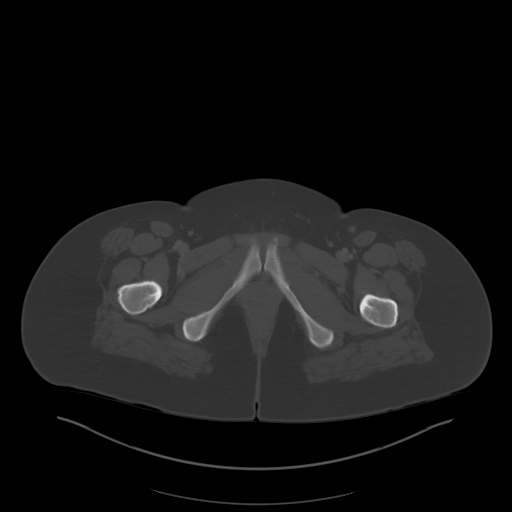
[im 13/95  soft-tissue]
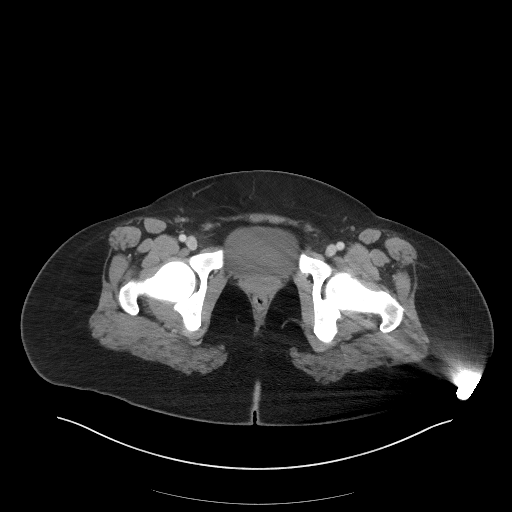
[im 19/95  soft-tissue]
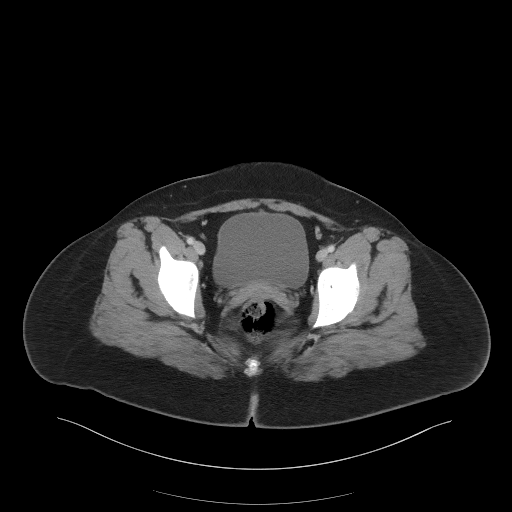
[im 28/95  soft-tissue]
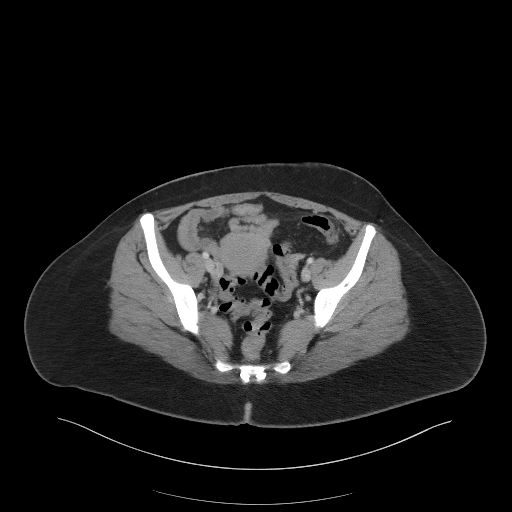
[im 34/95  soft-tissue]
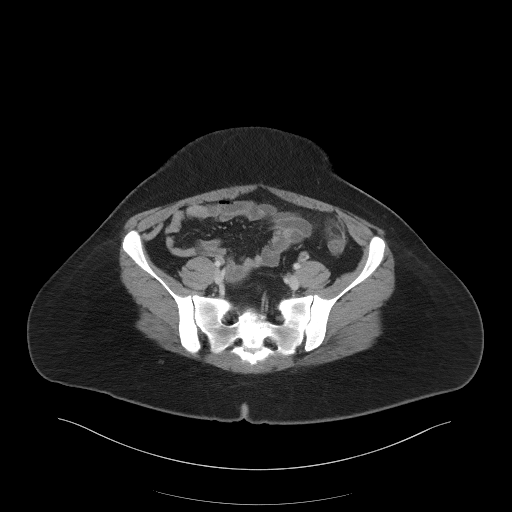
[im 40/95  soft-tissue]
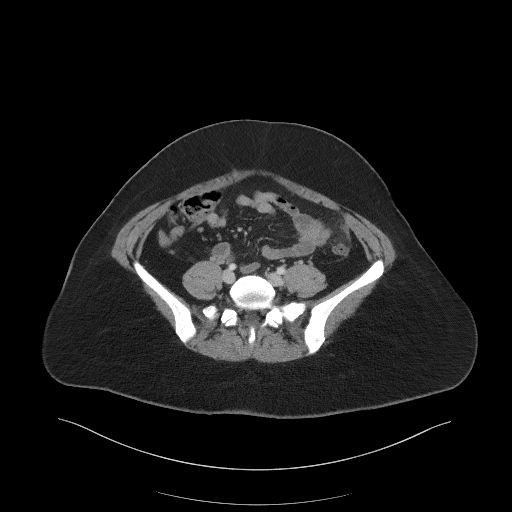
[im 49/95  soft-tissue]
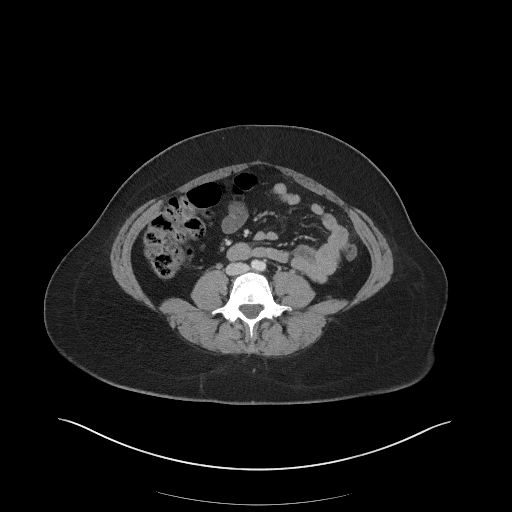
[im 55/95  soft-tissue]
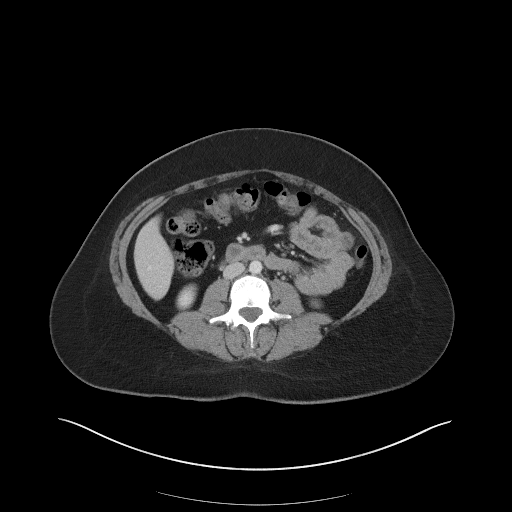
[im 61/95  soft-tissue]
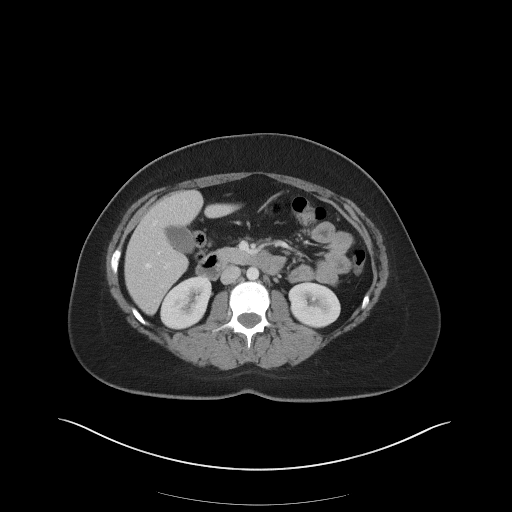
[im 61/95  bone]
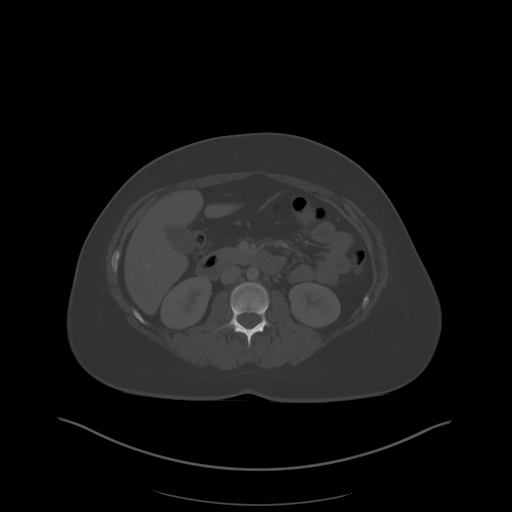
[im 67/95  soft-tissue]
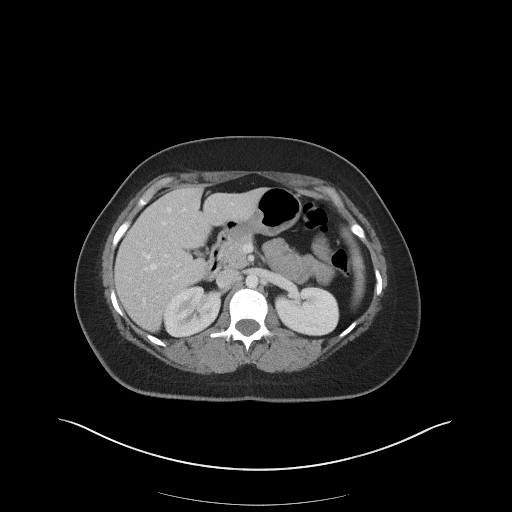
[im 76/95  soft-tissue]
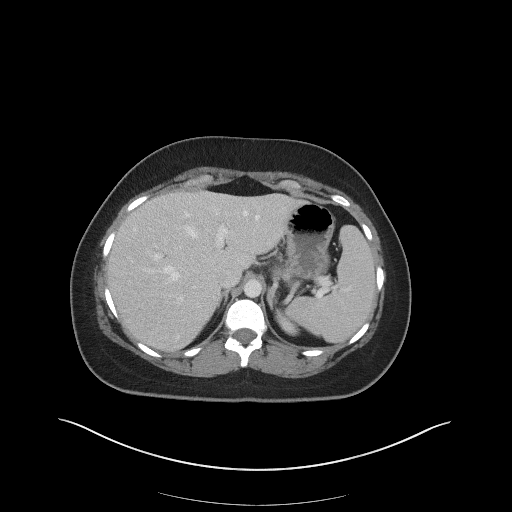
[im 82/95  soft-tissue]
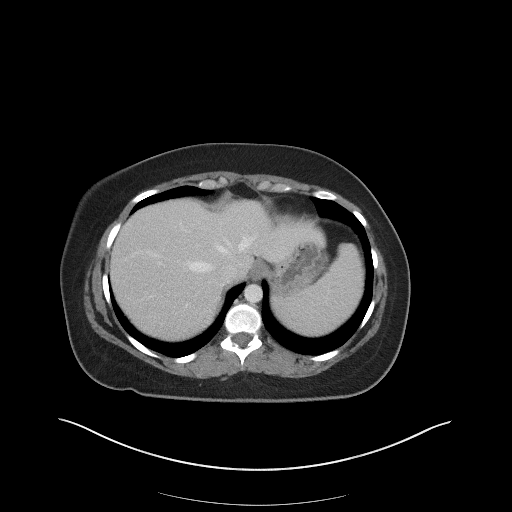
[im 88/95  soft-tissue]
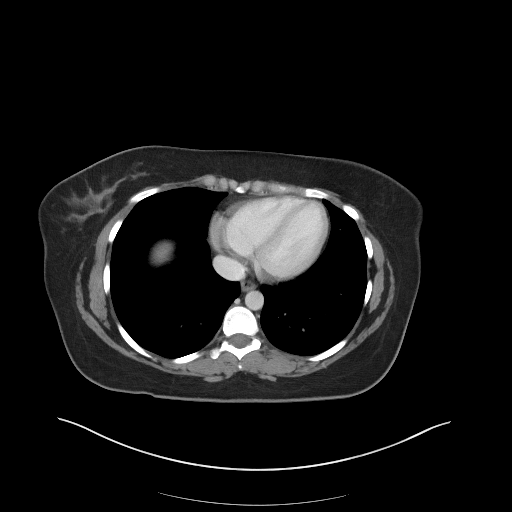

[Series 5: coronal st · coronal · 0.82mm/px · 3 of 89 slices shown]
[im 30/89  soft-tissue]
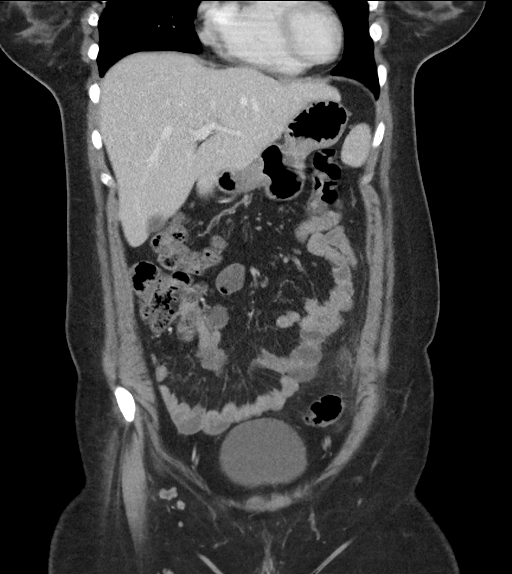
[im 40/89  soft-tissue]
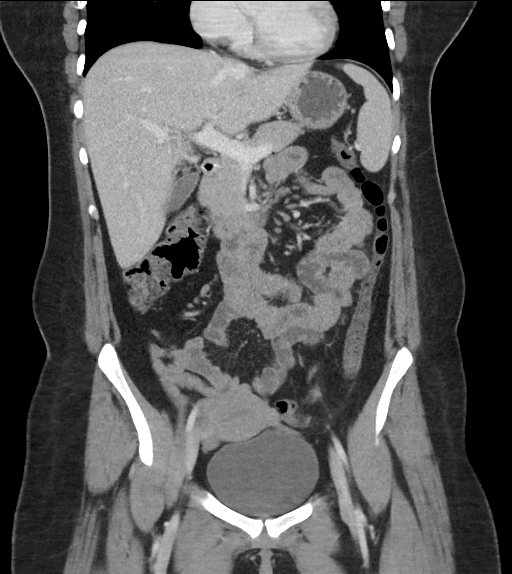
[im 49/89  soft-tissue]
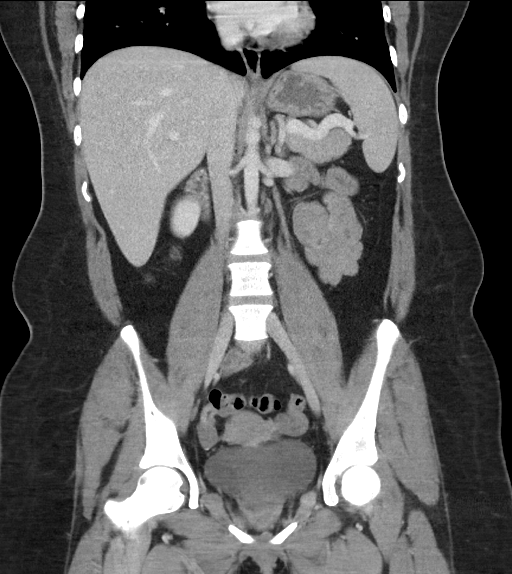

[16 of 46 positions shown; findings below may reference images not displayed]

FINDINGS: Lower chest: No acute abnormality.

Hepatobiliary: No focal liver abnormality is seen. No gallstones,
gallbladder wall thickening, or biliary dilatation.

Pancreas: Unremarkable. No pancreatic ductal dilatation or
surrounding inflammatory changes.

Spleen: Normal in size without focal abnormality.

Adrenals/Urinary Tract: Adrenal glands appear normal. Kidneys are
unremarkable without mass, stone or hydronephrosis. No perinephric
fluid. No ureteral or bladder calculi identified. Bladder appears
normal, partially decompressed.

Stomach/Bowel: No dilated large or small bowel loops. Thickening of
the walls of the lower descending colon, with associated pericolonic
inflammation/fluid stranding. No diverticulosis noted.

Appendix is normal. Stomach is unremarkable, partially decompressed.

Vascular/Lymphatic: No significant vascular findings are present. No
enlarged abdominal or pelvic lymph nodes.

Reproductive: Uterus and bilateral adnexa are unremarkable.

Other: No abscess collection seen. No free intraperitoneal air.

Musculoskeletal: No acute or significant osseous findings.
IMPRESSION: 1. Thickening of the walls of a focal segment of the lower
descending colon, with associated pericolonic inflammation/fluid
stranding. Findings are compatible with either a focal colitis of
infectious or inflammatory nature or, more likely, epiploic
appendagitis.
2. Appendix is normal. No bowel obstruction. No renal or ureteral
calculi. No abscess collection. No free intraperitoneal air.

## 2020-11-14 ENCOUNTER — Encounter: Payer: Self-pay | Admitting: Emergency Medicine

## 2020-11-14 ENCOUNTER — Ambulatory Visit
Admission: EM | Admit: 2020-11-14 | Discharge: 2020-11-14 | Disposition: A | Discharge status: Home / Self Care | Payer: Self-pay | Attending: Emergency Medicine | Admitting: Emergency Medicine

## 2020-11-14 ENCOUNTER — Other Ambulatory Visit: Payer: Self-pay

## 2020-11-14 DIAGNOSIS — R220 Localized swelling, mass and lump, head: Secondary | ICD-10-CM

## 2020-11-14 DIAGNOSIS — K047 Periapical abscess without sinus: Secondary | ICD-10-CM

## 2020-11-14 DIAGNOSIS — K0889 Other specified disorders of teeth and supporting structures: Secondary | ICD-10-CM

## 2020-11-14 MED ORDER — PREDNISONE 10 MG (21) PO TBPK: ORAL_TABLET | Freq: Every day | ORAL | 0 refills | Status: DC

## 2020-11-14 MED ORDER — DEXAMETHASONE SODIUM PHOSPHATE 10 MG/ML IJ SOLN
10.0000 mg | Freq: Once | INTRAMUSCULAR | Status: AC
  Administered 2020-11-14: 10 mg via INTRAMUSCULAR

## 2020-11-14 MED ORDER — AMOXICILLIN-POT CLAVULANATE 875-125 MG PO TABS: 1.0000 | ORAL_TABLET | Freq: Two times a day (BID) | ORAL | 0 refills | Status: AC

## 2020-11-14 NOTE — Discharge Instructions (Signed)
Steroid shot for facial swelling Prednisone for facial swelling Augmentin for dental infection Follow up with dentist as soon as possible for further evaluation and treatment  Return or go to the ED if you have any new or worsening symptoms such as fever, chills, difficulty swallowing, painful swallowing, oral or neck swelling, nausea, vomiting, chest pain, SOB, etc..Marland Kitchen

## 2020-11-14 NOTE — ED Provider Notes (Signed)
[facility]   [id number] 11/14/20 Arrival Time: 1246  CC: DENTAL PAIN  SUBJECTIVE:  Carla Rose is a 25 y.o. female who presents with RT lower dental pain and facial swelling x 1 week.  Reports concern for infected tooth or wisdom teeth coming in.  Has tried OTC analgesics without relief.  Worse with chewing.  Does not see a dentist regularly.  Reports similar symptoms in the past.  Denies fever, chills, dysphagia, odynophagia, oral or neck swelling, nausea, vomiting, chest pain, SOB.    ROS: As per HPI.  All other pertinent ROS negative.     Past Medical History:  Diagnosis Date  . Anemia   . Chlamydia   . Gonorrhea   . Hypertension   . Pregnancy induced hypertension    Past Surgical History:  Procedure Laterality Date  . NO PAST SURGERIES     No Known Allergies No current facility-administered medications on file prior to encounter.   Current Outpatient Medications on File Prior to Encounter  Medication Sig Dispense Refill  . acetaminophen (TYLENOL) 325 MG tablet Take 650 mg by mouth every 6 (six) hours as needed.    . flintstones complete (FLINTSTONES) 60 MG chewable tablet Chew 2 tablets by mouth daily.    . [DISCONTINUED] hydrochlorothiazide (MICROZIDE) 12.5 MG capsule Take 1 capsule (12.5 mg total) by mouth daily. (Patient not taking: Reported on 08/24/2018) 10 capsule 0  . [DISCONTINUED] lisinopril (ZESTRIL) 10 MG tablet Take 1 tablet (10 mg total) by mouth daily. (Patient not taking: Reported on 08/24/2018) 30 tablet 0   Social History   Socioeconomic History  . Marital status: Single    Spouse name: Not on file  . Number of children: 3  . Years of education: Not on file  . Highest education level: Not on file  Occupational History  . Not on file  Tobacco Use  . Smoking status: Former Smoker    Packs/day: 5.00    Types: Cigars  . Smokeless tobacco: Never Used  Vaping Use  . Vaping Use: Never used  Substance and Sexual Activity  . Alcohol use:  No  . Drug use: No  . Sexual activity: Not Currently    Birth control/protection: None  Other Topics Concern  . Not on file  Social History Narrative  . Not on file   Social Determinants of Health   Financial Resource Strain: Not on file  Food Insecurity: Not on file  Transportation Needs: Not on file  Physical Activity: Not on file  Stress: Not on file  Social Connections: Not on file  Intimate Partner Violence: Not on file   Family History  Problem Relation Age of Onset  . Crohn's disease Father   . Crohn's disease Sister   . Heart disease Paternal Grandmother     OBJECTIVE:  Vitals:   11/14/20 1406  BP: 123/82  Pulse: 71  Resp: 16  Temp: 98 F (36.7 C)  TempSrc: Tympanic  SpO2: 98%    General appearance: alert; no distress HENT: normocephalic; atraumatic; RT sided facial swelling; EACs clear, TMs pearly gray; nares patent; dentition: fair; poor over right lower gums without areas of fluctuance Neck: supple without LAD Lungs: normal respirations Skin: warm and dry Psychological: alert and cooperative; normal mood and affect  ASSESSMENT & PLAN:  1. Pain, dental   2. Dental infection   3. Facial swelling     Meds ordered this encounter  Medications  . predniSONE (STERAPRED UNI-PAK 21 TAB) 10 MG (21) TBPK tablet  Sig: Take by mouth daily. Take 6 tabs by mouth daily  for 2 days, then 5 tabs for 2 days, then 4 tabs for 2 days, then 3 tabs for 2 days, 2 tabs for 2 days, then 1 tab by mouth daily for 2 days    Dispense:  42 tablet    Refill:  0    Order Specific Question:   Supervising Provider    Answer:   Eustace Moore [[id number]]  . amoxicillin-clavulanate (AUGMENTIN) 875-125 MG tablet    Sig: Take 1 tablet by mouth every 12 (twelve) hours for 10 days.    Dispense:  20 tablet    Refill:  0    Order Specific Question:   Supervising Provider    Answer:   Eustace Moore [[id number]]  . dexamethasone (DECADRON) injection 10 mg    @CSR @  Steroid  shot for facial swelling Prednisone for facial swelling Augmentin for dental infection Follow up with dentist as soon as possible for further evaluation and treatment  Return or go to the ED if you have any new or worsening symptoms such as fever, chills, difficulty swallowing, painful swallowing, oral or neck swelling, nausea, vomiting, chest pain, SOB, etc...  Reviewed expectations re: course of current medical issues. Questions answered. Outlined signs and symptoms indicating need for more acute intervention. Patient verbalized understanding. After Visit Summary given.   , PA-C 11/14/20 1459

## 2020-11-14 NOTE — ED Triage Notes (Signed)
RT side of neck is swelling, states she has a tooth that is decayed on that side.

## 2021-07-30 ENCOUNTER — Other Ambulatory Visit: Payer: Self-pay

## 2021-07-30 ENCOUNTER — Encounter: Payer: Self-pay | Admitting: Emergency Medicine

## 2021-07-30 ENCOUNTER — Ambulatory Visit
Admission: EM | Admit: 2021-07-30 | Discharge: 2021-07-30 | Disposition: A | Discharge status: Home / Self Care | Payer: Self-pay | Attending: Urgent Care | Admitting: Urgent Care

## 2021-07-30 DIAGNOSIS — R070 Pain in throat: Secondary | ICD-10-CM

## 2021-07-30 DIAGNOSIS — J3089 Other allergic rhinitis: Secondary | ICD-10-CM

## 2021-07-30 DIAGNOSIS — K047 Periapical abscess without sinus: Secondary | ICD-10-CM

## 2021-07-30 MED ORDER — NAPROXEN 375 MG PO TABS: 375.0000 mg | ORAL_TABLET | Freq: Two times a day (BID) | ORAL | 0 refills | Status: DC

## 2021-07-30 MED ORDER — FLUTICASONE PROPIONATE 50 MCG/ACT NA SUSP: 2.0000 | Freq: Every day | NASAL | 12 refills | Status: DC

## 2021-07-30 MED ORDER — AMOXICILLIN-POT CLAVULANATE 875-125 MG PO TABS: 1.0000 | ORAL_TABLET | Freq: Two times a day (BID) | ORAL | 0 refills | Status: DC

## 2021-07-30 MED ORDER — PSEUDOEPHEDRINE HCL 60 MG PO TABS: 60.0000 mg | ORAL_TABLET | Freq: Three times a day (TID) | ORAL | 0 refills | Status: DC | PRN

## 2021-07-30 MED ORDER — CETIRIZINE HCL 10 MG PO TABS: 10.0000 mg | ORAL_TABLET | Freq: Every day | ORAL | 0 refills | Status: DC

## 2021-07-30 NOTE — Discharge Instructions (Signed)
Make sure you schedule an appointment with a dentist/dental surgeon as soon as possible.  You may try some of the resources below.    [facility] in [place], [place] Address: [address], [place], [place] [address] Phone: [phone]  [facility] [phone] extension [phone]  Dr. Civils [phone] [address]  [facility] [phone] [address]  [facility] [phone] extension [phone] [place], [place], [address] First come first serve for the first 10 clients.  May do simple extractions only, no wisdom teeth or surgery.  You may try the second for Thursday of the month starting at 6:30 AM.  [employer] You may call the school to see if they are still helping to provide dental care for emergent cases.  

## 2021-07-30 NOTE — ED Triage Notes (Signed)
Sore throat, nasal congestion x 3 days.  States she had flu 3 weeks ago.

## 2021-07-30 NOTE — ED Provider Notes (Signed)
Dante   MRN: [id number] DOB: Jun 11, 1996  Subjective:   Carla Rose is a 26 y.o. female with PMH of allergic rhinitis presenting for 3-day history of recurrent sinus congestion, throat pain.  She is also had a cough, intermittent chest pain and shortness of breath.  Tested positive for influenza 3 weeks ago.  Does not want repeat testing.  Patient is also having bilateral lower dental pain.  Notes that she needs dental work but has not establish care with a dentist.  No history of asthma.  She is not taking allergy medications.  No smoking.  No current facility-administered medications for this encounter.  Current Outpatient Medications:    acetaminophen (TYLENOL) 325 MG tablet, Take 650 mg by mouth every 6 (six) hours as needed., Disp: , Rfl:    flintstones complete (FLINTSTONES) 60 MG chewable tablet, Chew 2 tablets by mouth daily., Disp: , Rfl:    predniSONE (STERAPRED UNI-PAK 21 TAB) 10 MG (21) TBPK tablet, Take by mouth daily. Take 6 tabs by mouth daily  for 2 days, then 5 tabs for 2 days, then 4 tabs for 2 days, then 3 tabs for 2 days, 2 tabs for 2 days, then 1 tab by mouth daily for 2 days, Disp: 42 tablet, Rfl: 0   No Known Allergies  Past Medical History:  Diagnosis Date   Anemia    Chlamydia    Gonorrhea    Hypertension    Pregnancy induced hypertension      Past Surgical History:  Procedure Laterality Date   NO PAST SURGERIES      Family History  Problem Relation Age of Onset   Crohn's disease Father    Crohn's disease Sister    Heart disease Paternal Grandmother     Social History   Tobacco Use   Smoking status: Former    Packs/day: 5.00    Types: Cigars, Cigarettes   Smokeless tobacco: Never  Vaping Use   Vaping Use: Never used  Substance Use Topics   Alcohol use: No   Drug use: No    ROS   Objective:   Vitals: BP (!) 130/91 (BP Location: Right Arm)    Pulse (!) 101    Temp 98.2 F (36.8 C) (Oral)    Resp 18    SpO2  98%   Physical Exam Constitutional:      General: She is not in acute distress.    Appearance: Normal appearance. She is well-developed. She is not ill-appearing, toxic-appearing or diaphoretic.  HENT:     Head: Normocephalic and atraumatic.      Right Ear: Tympanic membrane, ear canal and external ear normal. No drainage or tenderness. No middle ear effusion. Tympanic membrane is not erythematous.     Left Ear: Tympanic membrane, ear canal and external ear normal. No drainage or tenderness.  No middle ear effusion. Tympanic membrane is not erythematous.     Nose: Congestion present. No rhinorrhea.     Mouth/Throat:     Mouth: Mucous membranes are moist.     Pharynx: No pharyngeal swelling, oropharyngeal exudate, posterior oropharyngeal erythema or uvula swelling.     Tonsils: No tonsillar exudate or tonsillar abscesses.      Comments: Significant postnasal drainage overlying pharynx. Eyes:     General: No scleral icterus.       Right eye: No discharge.        Left eye: No discharge.     Extraocular Movements: Extraocular movements intact.  Right eye: Normal extraocular motion.     Left eye: Normal extraocular motion.     Conjunctiva/sclera: Conjunctivae normal.  Cardiovascular:     Rate and Rhythm: Normal rate and regular rhythm.     Pulses: Normal pulses.     Heart sounds: Normal heart sounds. No murmur heard.   No friction rub. No gallop.  Pulmonary:     Effort: Pulmonary effort is normal. No respiratory distress.     Breath sounds: Normal breath sounds. No stridor. No wheezing, rhonchi or rales.  Musculoskeletal:     Cervical back: Normal range of motion and neck supple.  Lymphadenopathy:     Cervical: No cervical adenopathy.  Skin:    General: Skin is warm and dry.     Findings: No rash.  Neurological:     General: No focal deficit present.     Mental Status: She is alert and oriented to person, place, and time.  Psychiatric:        Mood and Affect: Mood normal.         Behavior: Behavior normal.        Thought Content: Thought content normal.    Assessment and Plan :   PDMP not reviewed this encounter.  1. Dental infection   2. Allergic rhinitis due to other allergic trigger, unspecified seasonality   3. Throat pain    Start Augmentin for dental infection/abscess, use naproxen for pain and inflammation. Emphasized need for dental surgeon consult.  Recommended she start allergy treatments daily.  Deferred imaging given clear cardiopulmonary exam, hemodynamically stable vital signs. Counseled patient on potential for adverse effects with medications prescribed/recommended today, strict ER and return-to-clinic precautions discussed, patient verbalized understanding.    Jaynee Eagles, PA-C 07/30/21 1427

## 2021-10-07 ENCOUNTER — Other Ambulatory Visit: Payer: Self-pay

## 2021-10-07 ENCOUNTER — Encounter: Payer: Self-pay | Admitting: Women's Health

## 2021-10-10 ENCOUNTER — Encounter: Payer: Self-pay | Admitting: Women's Health

## 2021-10-10 ENCOUNTER — Ambulatory Visit (INDEPENDENT_AMBULATORY_CARE_PROVIDER_SITE_OTHER): Payer: BC Managed Care – PPO | Admitting: Women's Health

## 2021-10-10 ENCOUNTER — Other Ambulatory Visit: Payer: Self-pay

## 2021-10-10 VITALS — BP 130/89 | HR 80 | Ht 68.0 in | Wt 196.0 lb

## 2021-10-10 DIAGNOSIS — Z3046 Encounter for surveillance of implantable subdermal contraceptive: Secondary | ICD-10-CM | POA: Diagnosis not present

## 2021-10-10 NOTE — Progress Notes (Signed)
? ?  NEXPLANON REMOVAL ?Patient name: Carla Rose MRN [id number]  Date of birth: 03-05-1996 ?Subjective Findings:   ?Carla Rose is a 26 y.o. G34P3003 Caucasian female being seen today for removal of a Nexplanon. Her Nexplanon was placed 09/14/18.  She desires removal because it's time and she wants to get pregnant. Taking flinstones. Signed copy of informed consent in chart.  ? ?Patient's last menstrual period was 10/07/2021 (exact date). ?Last pap8/1/19. Results were: NILM w/ HRHPV not done ?The planned method of family planning is none ? ?Depression screen [facility] 2/9 10/10/2021 02/25/2018  ?Decreased Interest 0 0  ?Down, Depressed, Hopeless 0 0  ?PHQ - 2 Score 0 0  ?Altered sleeping 0 0  ?Tired, decreased energy 1 0  ?Change in appetite 0 0  ?Feeling bad or failure about yourself  1 0  ?Trouble concentrating 0 0  ?Moving slowly or fidgety/restless 0 0  ?Suicidal thoughts 0 0  ?PHQ-9 Score 2 0  ? ?  ?GAD 7 : Generalized Anxiety Score 10/10/2021  ?Nervous, Anxious, on Edge 0  ?Control/stop worrying 0  ?Worry too much - different things 0  ?Trouble relaxing 0  ?Restless 0  ?Easily annoyed or irritable 0  ?Afraid - awful might happen 0  ?Total GAD 7 Score 0  ? ? ? ?Pertinent History Reviewed:   ?Reviewed past medical,surgical, social, obstetrical and family history.  ?Reviewed problem list, medications and allergies. ?Objective Findings & Procedure:   ? ?Vitals:  ? 10/10/21 1409  ?BP: 130/89  ?Pulse: 80  ?Weight: 196 lb (88.9 kg)  ?Height: 5\' 8"  (1.727 m)  ?Body mass index is 29.8 kg/m?. ? ?No results found for this or any previous visit (from the past 24 hour(s)).  ? ?Time out was performed. ? ?Nexplanon site identified.  Area prepped in usual sterile fashon. One cc of 2% lidocaine was used to anesthetize the area at the distal end of the implant. A small stab incision was made right beside the implant on the distal portion.  The Nexplanon rod was grasped using hemostats and removed without difficulty.  There was less  than 3 cc blood loss. There were no complications.  Steri-strips were applied over the small incision and a pressure bandage was applied.  The patient tolerated the procedure well. ?Assessment & Plan:   ?1) Nexplanon removal ?She was instructed to keep the area clean and dry, remove pressure bandage in 24 hours, and keep insertion site covered with the steri-strip for 3-5 days.   ?Follow-up PRN problems. ? ?No orders of the defined types were placed in this encounter. ? ? ?Follow-up: Return for 1st available, Pap & physical. ? ? CNM, WHNP-BC ?10/10/2021 ?2:28 PM  ?

## 2021-10-10 NOTE — Patient Instructions (Signed)
Keep the area clean and dry.  You can remove the big bandage in 24 hours, and the small steri-strip bandage in 3-5 days.  

## 2021-10-28 ENCOUNTER — Other Ambulatory Visit (HOSPITAL_COMMUNITY)
Admission: RE | Admit: 2021-10-28 | Discharge: 2021-10-28 | Disposition: A | Discharge status: Home / Self Care | Payer: BC Managed Care – PPO | Source: Ambulatory Visit | Attending: Women's Health | Admitting: Women's Health

## 2021-10-28 ENCOUNTER — Encounter: Payer: Self-pay | Admitting: Women's Health

## 2021-10-28 ENCOUNTER — Ambulatory Visit (INDEPENDENT_AMBULATORY_CARE_PROVIDER_SITE_OTHER): Payer: BC Managed Care – PPO | Admitting: Women's Health

## 2021-10-28 VITALS — BP 119/81 | HR 83 | Ht 68.0 in | Wt 196.2 lb

## 2021-10-28 DIAGNOSIS — Z01419 Encounter for gynecological examination (general) (routine) without abnormal findings: Secondary | ICD-10-CM | POA: Diagnosis not present

## 2021-10-28 NOTE — Progress Notes (Signed)
? ?WELL-WOMAN EXAMINATION ?Patient name: Carla Rose MRN [id number]  Date of birth: 05/13/1996 ?Chief Complaint:   ?Gynecologic Exam and Annual Exam ? ?History of Present Illness:   ?Carla Rose is a 26 y.o. G27P3003 Caucasian female being seen today for a routine well-woman exam.  ?Current complaints: none, taking flinstones, trying for pregnancy. Nexplanon removed 10/10/21 ? ?PCP: none      ?does not desire labs ?Patient's last menstrual period was 10/07/2021 (exact date). ?The current method of family planning is none ?Last pap 02/25/18. Results were: NILM w/ HRHPV not done. H/O abnormal pap: no ?Last mammogram: never. Results were: N/A. Family h/o breast cancer: no ?Last colonoscopy: never. Results were: N/A. Family h/o colorectal cancer: no ? ? ?  10/28/2021  ? 11:09 AM 10/10/2021  ?  2:13 PM 02/25/2018  ?  4:11 PM  ?Depression screen PHQ 2/9  ?Decreased Interest 0 0 0  ?Down, Depressed, Hopeless 0 0 0  ?PHQ - 2 Score 0 0 0  ?Altered sleeping 0 0 0  ?Tired, decreased energy 0 1 0  ?Change in appetite 0 0 0  ?Feeling bad or failure about yourself  0 1 0  ?Trouble concentrating 0 0 0  ?Moving slowly or fidgety/restless 0 0 0  ?Suicidal thoughts 0 0 0  ?PHQ-9 Score 0 2 0  ? ?  ? ?  10/28/2021  ? 11:10 AM 10/10/2021  ?  2:13 PM  ?GAD 7 : Generalized Anxiety Score  ?Nervous, Anxious, on Edge 0 0  ?Control/stop worrying 0 0  ?Worry too much - different things 0 0  ?Trouble relaxing 0 0  ?Restless 0 0  ?Easily annoyed or irritable 0 0  ?Afraid - awful might happen 0 0  ?Total GAD 7 Score 0 0  ? ? ? ?Review of Systems:   ?Pertinent items are noted in HPI ?Denies any headaches, blurred vision, fatigue, shortness of breath, chest pain, abdominal pain, abnormal vaginal discharge/itching/odor/irritation, problems with periods, bowel movements, urination, or intercourse unless otherwise stated above. ?Pertinent History Reviewed:  ?Reviewed past medical,surgical, social and family history.  ?Reviewed problem list, medications  and allergies. ?Physical Assessment:  ? ?Vitals:  ? 10/28/21 1107  ?BP: 119/81  ?Pulse: 83  ?Weight: 196 lb 3.2 oz (89 kg)  ?Height: 5\' 8"  (1.727 m)  ?Body mass index is 29.83 kg/m?. ?  ?     Physical Examination:  ? General appearance - well appearing, and in no distress ? Mental status - alert, oriented to person, place, and time ? Psych:  She has a normal mood and affect ? Skin - warm and dry, normal color, no suspicious lesions noted ? Chest - effort normal, all lung fields clear to auscultation bilaterally ? Heart - normal rate and regular rhythm ? Neck:  midline trachea, no thyromegaly or nodules ? Breasts - breasts appear normal, no suspicious masses, no skin or nipple changes or  axillary nodes ? Abdomen - soft, nontender, nondistended, no masses or organomegaly ? Pelvic - VULVA: normal appearing vulva with no masses, tenderness or lesions  VAGINA: normal appearing vagina with normal color and discharge, no lesions  CERVIX: normal appearing cervix without discharge or lesions, no CMT ? Thin prep pap is done w/ reflex HR HPV cotesting ? UTERUS: uterus is felt to be normal size, shape, consistency and nontender  ? ADNEXA: No adnexal masses or tenderness noted. ? Extremities:  No swelling or varicosities noted ? ?Chaperone: Neas   ? ?No results found for  this or any previous visit (from the past 24 hour(s)).  ?Assessment & Plan:  ?1) Well-Woman Exam ? ?2) Trying for pregnancy> continue vitamins, let us know when gets +HPT ? ?Labs/procedures today: pap ? ?Mammogram: @ 25yo, or sooner if problems ?Colonoscopy: @ 25yo, or sooner if problems ? ?No orders of the defined types were placed in this encounter. ? ? ?Meds: No orders of the defined types were placed in this encounter. ? ? ?Follow-up: Return in about 1 year (around 10/29/2022) for Physical. ? ?Cheral Marker CNM, WHNP-BC ?10/28/2021 ?11:34 AM  ?

## 2021-10-31 LAB — CYTOLOGY - PAP
Chlamydia: NEGATIVE
Comment: NEGATIVE
Comment: NEGATIVE
Comment: NORMAL
Diagnosis: UNDETERMINED — AB
High risk HPV: NEGATIVE
Neisseria Gonorrhea: NEGATIVE

## 2021-11-05 ENCOUNTER — Encounter: Payer: Self-pay | Admitting: Women's Health

## 2021-11-05 DIAGNOSIS — R87619 Unspecified abnormal cytological findings in specimens from cervix uteri: Secondary | ICD-10-CM | POA: Insufficient documentation

## 2021-11-06 ENCOUNTER — Telehealth: Payer: Self-pay | Admitting: *Deleted

## 2021-11-06 NOTE — Telephone Encounter (Signed)
Husband advised to reach pt @ [phone]. Call can't be completed @ 11:49 am. JSY ?

## 2021-11-07 NOTE — Telephone Encounter (Signed)
Pt aware of her pap results and will need to repeat in 3 years. She should get a reminder letter from office closer to when it's due. Pt voiced understanding. JSY ?

## 2022-01-29 DIAGNOSIS — O00102 Left tubal pregnancy without intrauterine pregnancy: Secondary | ICD-10-CM | POA: Insufficient documentation

## 2022-01-29 DIAGNOSIS — R1030 Lower abdominal pain, unspecified: Secondary | ICD-10-CM | POA: Diagnosis not present

## 2022-01-29 DIAGNOSIS — O009 Unspecified ectopic pregnancy without intrauterine pregnancy: Secondary | ICD-10-CM | POA: Diagnosis not present

## 2022-01-29 NOTE — ED Notes (Signed)
Patient complaining of contraction like pains

## 2022-01-30 ENCOUNTER — Inpatient Hospital Stay (HOSPITAL_COMMUNITY)
Admission: AD | Admit: 2022-01-30 | Discharge: 2022-01-30 | Disposition: A | Discharge status: Home / Self Care | Payer: BC Managed Care – PPO | Attending: Obstetrics & Gynecology | Admitting: Obstetrics & Gynecology

## 2022-01-30 ENCOUNTER — Inpatient Hospital Stay (HOSPITAL_COMMUNITY): Payer: BC Managed Care – PPO

## 2022-01-30 ENCOUNTER — Other Ambulatory Visit: Payer: Self-pay

## 2022-01-30 ENCOUNTER — Inpatient Hospital Stay (HOSPITAL_COMMUNITY): Payer: BC Managed Care – PPO | Admitting: Certified Registered Nurse Anesthetist

## 2022-01-30 ENCOUNTER — Encounter (HOSPITAL_COMMUNITY): Admission: AD | Disposition: A | Discharge status: Home / Self Care | Payer: Self-pay | Source: Home / Self Care

## 2022-01-30 ENCOUNTER — Encounter (HOSPITAL_COMMUNITY): Payer: Self-pay | Admitting: Emergency Medicine

## 2022-01-30 DIAGNOSIS — Z3A00 Weeks of gestation of pregnancy not specified: Secondary | ICD-10-CM | POA: Diagnosis not present

## 2022-01-30 DIAGNOSIS — R109 Unspecified abdominal pain: Secondary | ICD-10-CM | POA: Diagnosis not present

## 2022-01-30 DIAGNOSIS — O209 Hemorrhage in early pregnancy, unspecified: Secondary | ICD-10-CM

## 2022-01-30 DIAGNOSIS — Z3A01 Less than 8 weeks gestation of pregnancy: Secondary | ICD-10-CM

## 2022-01-30 DIAGNOSIS — R1030 Lower abdominal pain, unspecified: Secondary | ICD-10-CM | POA: Diagnosis not present

## 2022-01-30 DIAGNOSIS — O00102 Left tubal pregnancy without intrauterine pregnancy: Secondary | ICD-10-CM | POA: Diagnosis not present

## 2022-01-30 DIAGNOSIS — O009 Unspecified ectopic pregnancy without intrauterine pregnancy: Secondary | ICD-10-CM | POA: Diagnosis not present

## 2022-01-30 DIAGNOSIS — O26891 Other specified pregnancy related conditions, first trimester: Secondary | ICD-10-CM | POA: Diagnosis not present

## 2022-01-30 DIAGNOSIS — R102 Pelvic and perineal pain: Secondary | ICD-10-CM

## 2022-01-30 DIAGNOSIS — O26851 Spotting complicating pregnancy, first trimester: Secondary | ICD-10-CM | POA: Diagnosis not present

## 2022-01-30 HISTORY — PX: LAPAROSCOPIC UNILATERAL SALPINGECTOMY: SHX5934

## 2022-01-30 LAB — TYPE AND SCREEN
ABO/RH(D): O POS
Antibody Screen: NEGATIVE

## 2022-01-30 LAB — CBC WITH DIFFERENTIAL/PLATELET
Abs Immature Granulocytes: 0.04 10*3/uL (ref 0.00–0.07)
Basophils Absolute: 0.1 10*3/uL (ref 0.0–0.1)
Basophils Relative: 1 %
Eosinophils Absolute: 0.1 10*3/uL (ref 0.0–0.5)
Eosinophils Relative: 2 %
HCT: 35.1 % — ABNORMAL LOW (ref 36.0–46.0)
Hemoglobin: 11.3 g/dL — ABNORMAL LOW (ref 12.0–15.0)
Immature Granulocytes: 1 %
Lymphocytes Relative: 16 %
Lymphs Abs: 1.4 10*3/uL (ref 0.7–4.0)
MCH: 28.4 pg (ref 26.0–34.0)
MCHC: 32.2 g/dL (ref 30.0–36.0)
MCV: 88.2 fL (ref 80.0–100.0)
Monocytes Absolute: 0.7 10*3/uL (ref 0.1–1.0)
Monocytes Relative: 8 %
Neutro Abs: 6.2 10*3/uL (ref 1.7–7.7)
Neutrophils Relative %: 72 %
Platelets: 253 10*3/uL (ref 150–400)
RBC: 3.98 MIL/uL (ref 3.87–5.11)
RDW: 13.2 % (ref 11.5–15.5)
WBC: 8.5 10*3/uL (ref 4.0–10.5)
nRBC: 0 % (ref 0.0–0.2)

## 2022-01-30 LAB — COMPREHENSIVE METABOLIC PANEL
ALT: 19 U/L (ref 0–44)
AST: 17 U/L (ref 15–41)
Albumin: 4 g/dL (ref 3.5–5.0)
Alkaline Phosphatase: 73 U/L (ref 38–126)
Anion gap: 9 (ref 5–15)
BUN: 11 mg/dL (ref 6–20)
CO2: 23 mmol/L (ref 22–32)
Calcium: 9.9 mg/dL (ref 8.9–10.3)
Chloride: 104 mmol/L (ref 98–111)
Creatinine, Ser: 0.65 mg/dL (ref 0.44–1.00)
GFR, Estimated: >60 mL/min (ref >60)
Glucose, Bld: 107 mg/dL — ABNORMAL HIGH (ref 70–99)
Potassium: 4 mmol/L (ref 3.5–5.1)
Sodium: 136 mmol/L (ref 135–145)
Total Bilirubin: 0.5 mg/dL (ref 0.3–1.2)
Total Protein: 6.7 g/dL (ref 6.5–8.1)

## 2022-01-30 LAB — HCG, QUANTITATIVE, PREGNANCY: hCG, Beta Chain, Quant, S: 2130 m[IU]/mL — ABNORMAL HIGH (ref <5)

## 2022-01-30 LAB — I-STAT BETA HCG BLOOD, ED (MC, WL, AP ONLY): I-stat hCG, quantitative: 1496.3 m[IU]/mL — ABNORMAL HIGH (ref <5)

## 2022-01-30 LAB — LIPASE, BLOOD: Lipase: 25 U/L (ref 11–51)

## 2022-01-30 SURGERY — SALPINGECTOMY, UNILATERAL, LAPAROSCOPIC
Anesthesia: General | Laterality: Left

## 2022-01-30 MED ORDER — SUCCINYLCHOLINE CHLORIDE 200 MG/10ML IV SOSY
PREFILLED_SYRINGE | INTRAVENOUS | Status: AC
  Filled 2022-01-30: qty 10

## 2022-01-30 MED ORDER — BUPIVACAINE HCL (PF) 0.25 % IJ SOLN
INTRAMUSCULAR | Status: DC | PRN
  Administered 2022-01-30: 12 mL

## 2022-01-30 MED ORDER — HYDROMORPHONE HCL 1 MG/ML IJ SOLN
0.2500 mg | INTRAMUSCULAR | Status: DC | PRN
  Administered 2022-01-30 (×2): 0.5 mg via INTRAVENOUS

## 2022-01-30 MED ORDER — DEXMEDETOMIDINE (PRECEDEX) IN NS 20 MCG/5ML (4 MCG/ML) IV SYRINGE
PREFILLED_SYRINGE | INTRAVENOUS | Status: DC | PRN
  Administered 2022-01-30 (×3): 4 ug via INTRAVENOUS

## 2022-01-30 MED ORDER — LIDOCAINE HCL (CARDIAC) PF 100 MG/5ML IV SOSY
PREFILLED_SYRINGE | INTRAVENOUS | Status: DC | PRN
  Administered 2022-01-30: 60 mg via INTRAVENOUS

## 2022-01-30 MED ORDER — HYDROMORPHONE HCL 1 MG/ML IJ SOLN
INTRAMUSCULAR | Status: AC
  Filled 2022-01-30: qty 1

## 2022-01-30 MED ORDER — SUCCINYLCHOLINE CHLORIDE 200 MG/10ML IV SOSY
PREFILLED_SYRINGE | INTRAVENOUS | Status: DC | PRN
  Administered 2022-01-30: 100 mg via INTRAVENOUS

## 2022-01-30 MED ORDER — HEMOSTATIC AGENTS (NO CHARGE) OPTIME
TOPICAL | Status: DC | PRN
  Administered 2022-01-30: 1

## 2022-01-30 MED ORDER — DEXAMETHASONE SODIUM PHOSPHATE 10 MG/ML IJ SOLN
INTRAMUSCULAR | Status: DC | PRN
  Administered 2022-01-30: 10 mg via INTRAVENOUS

## 2022-01-30 MED ORDER — ACETAMINOPHEN 10 MG/ML IV SOLN
INTRAVENOUS | Status: AC
  Filled 2022-01-30: qty 100

## 2022-01-30 MED ORDER — OXYCODONE-ACETAMINOPHEN 5-325 MG PO TABS: 1.0000 | ORAL_TABLET | ORAL | 0 refills | Status: DC | PRN

## 2022-01-30 MED ORDER — ONDANSETRON HCL 4 MG/2ML IJ SOLN
INTRAMUSCULAR | Status: AC
  Filled 2022-01-30: qty 2

## 2022-01-30 MED ORDER — ONDANSETRON HCL 4 MG/2ML IJ SOLN
INTRAMUSCULAR | Status: DC | PRN
  Administered 2022-01-30: 4 mg via INTRAVENOUS

## 2022-01-30 MED ORDER — ROCURONIUM BROMIDE 100 MG/10ML IV SOLN
INTRAVENOUS | Status: DC | PRN
  Administered 2022-01-30: 60 mg via INTRAVENOUS

## 2022-01-30 MED ORDER — BUPIVACAINE HCL (PF) 0.5 % IJ SOLN
INTRAMUSCULAR | Status: AC
  Filled 2022-01-30: qty 30

## 2022-01-30 MED ORDER — FENTANYL CITRATE (PF) 100 MCG/2ML IJ SOLN
INTRAMUSCULAR | Status: DC | PRN
  Administered 2022-01-30: 50 ug via INTRAVENOUS
  Administered 2022-01-30: 100 ug via INTRAVENOUS
  Administered 2022-01-30 (×2): 50 ug via INTRAVENOUS

## 2022-01-30 MED ORDER — MIDAZOLAM HCL 5 MG/5ML IJ SOLN
INTRAMUSCULAR | Status: DC | PRN
  Administered 2022-01-30: 2 mg via INTRAVENOUS

## 2022-01-30 MED ORDER — HYDROMORPHONE HCL 1 MG/ML IJ SOLN
1.0000 mg | Freq: Once | INTRAMUSCULAR | Status: AC
  Administered 2022-01-30: 1 mg via INTRAVENOUS
  Filled 2022-01-30: qty 1

## 2022-01-30 MED ORDER — PROPOFOL 10 MG/ML IV BOLUS
INTRAVENOUS | Status: AC
  Filled 2022-01-30: qty 20

## 2022-01-30 MED ORDER — ACETAMINOPHEN 10 MG/ML IV SOLN
INTRAVENOUS | Status: DC | PRN
  Administered 2022-01-30: 1000 mg via INTRAVENOUS

## 2022-01-30 MED ORDER — MIDAZOLAM HCL 2 MG/2ML IJ SOLN
INTRAMUSCULAR | Status: AC
  Filled 2022-01-30: qty 2

## 2022-01-30 MED ORDER — SODIUM CHLORIDE 0.9 % IV SOLN: INTRAVENOUS | Status: AC

## 2022-01-30 MED ORDER — LACTATED RINGERS IV SOLN: INTRAVENOUS | Status: DC | PRN

## 2022-01-30 MED ORDER — FENTANYL CITRATE (PF) 250 MCG/5ML IJ SOLN
INTRAMUSCULAR | Status: AC
  Filled 2022-01-30: qty 5

## 2022-01-30 MED ORDER — SUGAMMADEX SODIUM 200 MG/2ML IV SOLN
INTRAVENOUS | Status: DC | PRN
  Administered 2022-01-30: 200 mg via INTRAVENOUS

## 2022-01-30 MED ORDER — PROPOFOL 10 MG/ML IV BOLUS
INTRAVENOUS | Status: DC | PRN
  Administered 2022-01-30: 150 mg via INTRAVENOUS
  Administered 2022-01-30: 20 mg via INTRAVENOUS

## 2022-01-30 MED ORDER — SODIUM CHLORIDE 0.9 % IR SOLN
Status: DC | PRN
  Administered 2022-01-30: 3000 mL

## 2022-01-30 MED ORDER — LIDOCAINE 2% (20 MG/ML) 5 ML SYRINGE
INTRAMUSCULAR | Status: AC
  Filled 2022-01-30: qty 5

## 2022-01-30 SURGICAL SUPPLY — 34 items
BAG COUNTER SPONGE SURGICOUNT (BAG) ×3 IMPLANT
CABLE HIGH FREQUENCY MONO STRZ (ELECTRODE) IMPLANT
DERMABOND ADVANCED (GAUZE/BANDAGES/DRESSINGS) ×1
DERMABOND ADVANCED .7 DNX12 (GAUZE/BANDAGES/DRESSINGS) ×1 IMPLANT
DRSG OPSITE POSTOP 3X4 (GAUZE/BANDAGES/DRESSINGS) ×2 IMPLANT
DURAPREP 26ML APPLICATOR (WOUND CARE) ×3 IMPLANT
GLOVE BIO SURGEON STRL SZ 6.5 (GLOVE) ×3 IMPLANT
GLOVE BIOGEL PI IND STRL 7.0 (GLOVE) ×8 IMPLANT
GLOVE BIOGEL PI INDICATOR 7.0 (GLOVE) ×4
GOWN STRL REUS W/ TWL LRG LVL3 (GOWN DISPOSABLE) ×4 IMPLANT
GOWN STRL REUS W/TWL LRG LVL3 (GOWN DISPOSABLE) ×6
HEMOSTAT ARISTA ABSORB 3G PWDR (HEMOSTASIS) ×2 IMPLANT
IRRIG SUCT STRYKERFLOW 2 WTIP (MISCELLANEOUS) ×3
IRRIGATION SUCT STRKRFLW 2 WTP (MISCELLANEOUS) ×1 IMPLANT
KIT TURNOVER KIT B (KITS) ×3 IMPLANT
NDL INSUFFLATION 14GA 120MM (NEEDLE) ×1 IMPLANT
NEEDLE INSUFFLATION 14GA 120MM (NEEDLE) ×3 IMPLANT
NS IRRIG 1000ML POUR BTL (IV SOLUTION) ×3 IMPLANT
PACK LAPAROSCOPY BASIN (CUSTOM PROCEDURE TRAY) ×3 IMPLANT
PACK TRENDGUARD 450 HYBRID PRO (MISCELLANEOUS) ×1 IMPLANT
POUCH SPECIMEN RETRIEVAL 10MM (ENDOMECHANICALS) ×2 IMPLANT
PROTECTOR NERVE ULNAR (MISCELLANEOUS) ×6 IMPLANT
SET TUBE SMOKE EVAC HIGH FLOW (TUBING) ×3 IMPLANT
SHEARS HARMONIC ACE PLUS 36CM (ENDOMECHANICALS) ×2 IMPLANT
SLEEVE ENDOPATH XCEL 5M (ENDOMECHANICALS) ×3 IMPLANT
STRIP CLOSURE SKIN 1/2X4 (GAUZE/BANDAGES/DRESSINGS) IMPLANT
SUT VICRYL 0 UR6 27IN ABS (SUTURE) ×3 IMPLANT
SUT VICRYL 4-0 PS2 18IN ABS (SUTURE) ×3 IMPLANT
TOWEL GREEN STERILE FF (TOWEL DISPOSABLE) ×6 IMPLANT
TRAY FOLEY W/BAG SLVR 14FR (SET/KITS/TRAYS/PACK) ×3 IMPLANT
TRENDGUARD 450 HYBRID PRO PACK (MISCELLANEOUS) ×3
TROCAR XCEL DIL TIP R 11M (ENDOMECHANICALS) ×5 IMPLANT
TROCAR XCEL NON-BLD 5MMX100MML (ENDOMECHANICALS) ×3 IMPLANT
WARMER LAPAROSCOPE (MISCELLANEOUS) ×3 IMPLANT

## 2022-01-30 NOTE — MAU Note (Signed)
.  Carla Rose is a 26 y.o. at Unknown here in MAU reporting: lower ABD and lower back pain since 2000 01/28/2022. Pt reports that she was having ctx like pain today on her way to the ER. Pt states she has had VB for last 22 days and yesterday it slowed down to spotting. Pt took her IUD out last month for the plan for pregnancy and started bleeding after that. Pt states she had some pink mucus discharge.  Pt denies LOF. Last intercourse was last week.  LMP: 01/12/2022 Onset of complaint: 7/4 Pain score: 10/10 Vitals:   01/30/22 0039  BP: (!) 133/98  Pulse: 83  Resp: 18  Temp: 98.6 F (37 C)  SpO2: 100%      Lab orders placed from triage:  UA

## 2022-01-30 NOTE — ED Triage Notes (Signed)
Pt reports lower abdominal/pelvic pain/contractions that go to her back.

## 2022-01-30 NOTE — Op Note (Signed)
Carla Rose PROCEDURE DATE: 01/30/2022  PREOPERATIVE DIAGNOSIS: Ruptured ectopic pregnancy POSTOPERATIVE DIAGNOSIS: Ruptured left fallopian tube ectopic pregnancy PROCEDURE: Laparoscopic left salpingectomy and removal of ectopic pregnancy SURGEON:  Adam Phenix, MD ANESTHESIOLOGIST: Gaynelle Adu, MD Anesthesiologist: Gaynelle Adu, MD CRNA: Reine Just, CRNA; Rosiland Oz, CRNA  INDICATIONS: 26 y.o. [phone] at [redacted]w[redacted]d here with the preoperative diagnoses as listed above.  Please refer to preoperative notes for more details. Patient was counseled regarding need for laparoscopic salpingectomy. Risks of surgery including bleeding which may require transfusion or reoperation, infection, injury to bowel or other surrounding organs, need for additional procedures including laparotomy and other postoperative/anesthesia complications were explained to patient.  Written informed consent was obtained.  FINDINGS:  moderate amount of hemoperitoneum estimated to be about 75 ml of blood and clots.  Dilated left fallopian tube containing ectopic gestation. Small normal appearing uterus, normal right fallopian tube, right ovary and left ovary.  ANESTHESIA: General INTRAVENOUS FLUIDS: 1000 ml ESTIMATED BLOOD LOSS: 75 ml URINE OUTPUT: 100 ml SPECIMENS: Left fallopian tube containing ectopic gestation COMPLICATIONS: None immediate  PROCEDURE IN DETAIL:  The patient was taken to the operating room where general anesthesia was administered and was found to be adequate.  She was placed in the dorsal lithotomy position, and was prepped and draped in a sterile manner.  A Foley catheter was inserted into her bladder and attached to constant drainage and a uterine manipulator was then advanced into the uterus .    After an adequate timeout was performed, attention was turned to the abdomen where an umbilical incision was made with the scalpel.  The Optiview 11-mm trocar and sleeve were then  advanced without difficulty with the laparoscope under direct visualization into the abdomen.  The abdomen was then insufflated with carbon dioxide gas and adequate pneumoperitoneum was obtained.  A survey of the patient's pelvis and abdomen revealed the findings above.  Two 5-mm  lower quadrant ports were then placed under direct visualization.  The Nezhat suction irrigator was then used to suction the hemoperitoneum and irrigate the pelvis.  Attention was then turned to the left fallopian tube which was grasped and ligated from the underlying mesosalpinx and uterine attachment using the Harmonic instrument.  Good hemostasis was noted.  The specimen was placed in an EndoCatch bag and removed from the abdomen intact.  The abdomen was desufflated, and all instruments were removed.  The fascial incision of the 10-mm site was reapproximated with a 0 Vicryl figure-of-eight stitch; and all skin incisions were closed with 4-0 Vicryl and Dermabond. The patient tolerated the procedure well.  All instruments, needles, and sponge counts were correct x 2. The patient was taken to the recovery room in stable condition.   The patient will be discharged to home as per PACU criteria.  Routine postoperative instructions given.  She was prescribed Percocet, Ibuprofen .  She will follow up in the clinic in about 2-3 weeks for postoperative evaluation.   Adam Phenix, MD 01/30/2022 7:24 AM

## 2022-01-30 NOTE — Anesthesia Preprocedure Evaluation (Addendum)
Anesthesia Evaluation  Patient identified by MRN, date of birth, ID band Patient awake    Reviewed: Allergy & Precautions, H&P , NPO status , Patient's Chart, lab work & pertinent test results  Airway Mallampati: II  TM Distance: >3 FB Neck ROM: Full    Dental no notable dental hx. (+) Poor Dentition, Dental Advisory Given   Pulmonary neg pulmonary ROS, former smoker,    Pulmonary exam normal breath sounds clear to auscultation       Cardiovascular hypertension,  Rhythm:Regular Rate:Normal     Neuro/Psych  Headaches, negative psych ROS   GI/Hepatic negative GI ROS, Neg liver ROS,   Endo/Other  negative endocrine ROS  Renal/GU negative Renal ROS  negative genitourinary   Musculoskeletal   Abdominal   Peds  Hematology  (+) Blood dyscrasia, anemia ,   Anesthesia Other Findings   Reproductive/Obstetrics negative OB ROS                            Anesthesia Physical Anesthesia Plan  ASA: 2 and emergent  Anesthesia Plan: General   Post-op Pain Management: Ofirmev IV (intra-op)* and Toradol IV (intra-op)*   Induction: Intravenous, Rapid sequence and Cricoid pressure planned  PONV Risk Score and Plan: 4 or greater and Ondansetron, Dexamethasone and Midazolam  Airway Management Planned: Oral ETT  Additional Equipment:   Intra-op Plan:   Post-operative Plan: Extubation in OR  Informed Consent: I have reviewed the patients History and Physical, chart, labs and discussed the procedure including the risks, benefits and alternatives for the proposed anesthesia with the patient or authorized representative who has indicated his/her understanding and acceptance.     Dental advisory given  Plan Discussed with: CRNA  Anesthesia Plan Comments:         Anesthesia Quick Evaluation

## 2022-01-30 NOTE — MAU Provider Note (Signed)
Chief Complaint: Abdominal Pain   Event Date/Time   First Provider Initiated Contact with Patient 01/30/22 0227        SUBJECTIVE HPI: Carla Rose is a 26 y.o. G4P3003 at Unknown by LMP who presents to maternity admissions reporting pelvic pain and vaginal bleeding.  Had her Nexplanon removed in March and has been trying to get pregnant.  Neg pregnancy test at home.  . She denies urinary symptoms, h/a, dizziness, n/v, or fever/chills.    Abdominal Pain This is a new problem. The current episode started 1 to 4 weeks ago. The problem occurs constantly. The problem has been gradually worsening. The pain is located in the suprapubic region, LLQ and RLQ. The quality of the pain is cramping and dull. Pertinent negatives include no fever, frequency, myalgias, nausea or vomiting. The pain is aggravated by palpation and movement. The pain is relieved by Nothing. She has tried nothing for the symptoms.   ED Note: Carla Rose , a 26 y.o. female  was evaluated in triage.  Pt complains of lower abdominal pain that has been present for a few weeks but worsening over the last few days.  Patient had her Nexplanon removed and attempt to try and get pregnant again and has had persistent vaginal bleeding that was initially heavier and now has been constant spotting, despite bleeding improving she has had worsening pain in her lower abdomen.  Tonight pain started radiating into her low back as well.  She also reports some vaginal discharge.  No dysuria.  No fevers.  No vomiting, normal bowel movements.  Patient reports pregnancy test at home have been negative.  Past Medical History:  Diagnosis Date   Anemia    Chlamydia    Gonorrhea    Hypertension    Pregnancy induced hypertension    Past Surgical History:  Procedure Laterality Date   NO PAST SURGERIES     Social History   Socioeconomic History   Marital status: Soil scientist    Spouse name: Not on file   Number of children: 3   Years of  education: Not on file   Highest education level: Not on file  Occupational History   Not on file  Tobacco Use   Smoking status: Former    Packs/day: 5.00    Types: Cigars, Cigarettes   Smokeless tobacco: Never  Vaping Use   Vaping Use: Never used  Substance and Sexual Activity   Alcohol use: No   Drug use: No   Sexual activity: Yes    Birth control/protection: None  Other Topics Concern   Not on file  Social History Narrative   Not on file   Social Determinants of Health   Financial Resource Strain: Medium Risk (10/28/2021)   Overall Financial Resource Strain (CARDIA)    Difficulty of Paying Living Expenses: Somewhat hard  Food Insecurity: No Food Insecurity (10/28/2021)   Hunger Vital Sign    Worried About Running Out of Food in the Last Year: Never true    Ran Out of Food in the Last Year: Never true  Transportation Needs: No Transportation Needs (10/28/2021)   PRAPARE - Hydrologist (Medical): No    Lack of Transportation (Non-Medical): No  Physical Activity: Insufficiently Active (10/28/2021)   Exercise Vital Sign    Days of Exercise per Week: 2 days    Minutes of Exercise per Session: 10 min  Stress: No Stress Concern Present (10/28/2021)   [place] -  Occupational Stress Questionnaire    Feeling of Stress : Not at all  Recent Concern: Stress - Stress Concern Present (10/10/2021)   Carla Rose    Feeling of Stress : Rather much  Social Connections: Moderately Integrated (10/28/2021)   Social Connection and Isolation Panel [NHANES]    Frequency of Communication with Friends and Family: More than three times a week    Frequency of Social Gatherings with Friends and Family: More than three times a week    Attends Religious Services: 1 to 4 times per year    Active Member of [employer] or Organizations: No    Attends Archivist Meetings: Never     Marital Status: Living with partner  Intimate Partner Violence: Not At Risk (10/28/2021)   Humiliation, Afraid, Rape, and Kick questionnaire    Fear of Current or Ex-Partner: No    Emotionally Abused: No    Physically Abused: No    Sexually Abused: No   No current facility-administered medications on file prior to encounter.   Current Outpatient Medications on File Prior to Encounter  Medication Sig Dispense Refill   acetaminophen (TYLENOL) 325 MG tablet Take 650 mg by mouth every 6 (six) hours as needed.     cetirizine (ZYRTEC ALLERGY) 10 MG tablet Take 1 tablet (10 mg total) by mouth daily. (Patient not taking: Reported on 10/28/2021) 30 tablet 0   flintstones complete (FLINTSTONES) 60 MG chewable tablet Chew 2 tablets by mouth daily.     naproxen (NAPROSYN) 375 MG tablet Take 1 tablet (375 mg total) by mouth 2 (two) times daily with a meal. (Patient not taking: Reported on 10/28/2021) 30 tablet 0   [DISCONTINUED] hydrochlorothiazide (MICROZIDE) 12.5 MG capsule Take 1 capsule (12.5 mg total) by mouth daily. (Patient not taking: Reported on 08/24/2018) 10 capsule 0   [DISCONTINUED] lisinopril (ZESTRIL) 10 MG tablet Take 1 tablet (10 mg total) by mouth daily. (Patient not taking: Reported on 08/24/2018) 30 tablet 0   No Known Allergies  I have reviewed patient's Past Medical Hx, Surgical Hx, Family Hx, Social Hx, medications and allergies.   ROS:  Review of Systems  Constitutional:  Negative for fever.  Gastrointestinal:  Positive for abdominal pain. Negative for nausea and vomiting.  Genitourinary:  Negative for frequency.  Musculoskeletal:  Negative for myalgias.   Review of Systems  Other systems negative   Physical Exam  Physical Exam Patient Vitals for the past 24 hrs:  BP Temp Temp src Pulse Resp SpO2 Height Weight  01/30/22 0052 -- -- -- -- -- -- 5\' 8"  (1.727 m) 89 kg  01/30/22 0039 (!) 133/98 98.6 F (37 C) Oral 83 18 100 % -- --   Constitutional: Well-developed,  well-nourished female in no acute distress, but uncomfortable  Cardiovascular: normal rate Respiratory: normal effort GI: Abd soft, non-tender.  MS: Extremities nontender, no edema, normal ROM Neurologic: Alert and oriented x 4.  GU: Neg CVAT.  PELVIC EXAM: deferred in lieu of transvaginal ultrasound.    LAB RESULTS Results for orders placed or performed during the hospital encounter of 01/30/22 (from the past 24 hour(s))  Comprehensive metabolic panel     Status: Abnormal   Collection Time: 01/30/22 12:57 AM  Result Value Ref Range   Sodium 136 135 - 145 mmol/L   Potassium 4.0 3.5 - 5.1 mmol/L   Chloride 104 98 - 111 mmol/L   CO2 23 22 - 32 mmol/L   Glucose, Bld 107 (H)  70 - 99 mg/dL   BUN 11 6 - 20 mg/dL   Creatinine, Ser 0.65 0.44 - 1.00 mg/dL   Calcium 9.9 8.9 - 10.3 mg/dL   Total Protein 6.7 6.5 - 8.1 g/dL   Albumin 4.0 3.5 - 5.0 g/dL   AST 17 15 - 41 U/L   ALT 19 0 - 44 U/L   Alkaline Phosphatase 73 38 - 126 U/L   Total Bilirubin 0.5 0.3 - 1.2 mg/dL   GFR, Estimated >60 >60 mL/min   Anion gap 9 5 - 15  CBC with Differential     Status: Abnormal   Collection Time: 01/30/22 12:57 AM  Result Value Ref Range   WBC 8.5 4.0 - 10.5 K/uL   RBC 3.98 3.87 - 5.11 MIL/uL   Hemoglobin 11.3 (L) 12.0 - 15.0 g/dL   HCT 35.1 (L) 36.0 - 46.0 %   MCV 88.2 80.0 - 100.0 fL   MCH 28.4 26.0 - 34.0 pg   MCHC 32.2 30.0 - 36.0 g/dL   RDW 13.2 11.5 - 15.5 %   Platelets 253 150 - 400 K/uL   nRBC 0.0 0.0 - 0.2 %   Neutrophils Relative % 72 %   Neutro Abs 6.2 1.7 - 7.7 K/uL   Lymphocytes Relative 16 %   Lymphs Abs 1.4 0.7 - 4.0 K/uL   Monocytes Relative 8 %   Monocytes Absolute 0.7 0.1 - 1.0 K/uL   Eosinophils Relative 2 %   Eosinophils Absolute 0.1 0.0 - 0.5 K/uL   Basophils Relative 1 %   Basophils Absolute 0.1 0.0 - 0.1 K/uL   Immature Granulocytes 1 %   Abs Immature Granulocytes 0.04 0.00 - 0.07 K/uL  Lipase, blood     Status: None   Collection Time: 01/30/22 12:57 AM  Result  Value Ref Range   Lipase 25 11 - 51 U/L  I-Stat beta hCG blood, ED     Status: Abnormal   Collection Time: 01/30/22  1:22 AM  Result Value Ref Range   I-stat hCG, quantitative 1,496.3 (H) <5 mIU/mL   Comment 3          hCG, quantitative, pregnancy     Status: Abnormal   Collection Time: 01/30/22  2:19 AM  Result Value Ref Range   hCG, Beta Chain, Quant, S 2,130 (H) <5 mIU/mL     IMAGING US OB LESS THAN 14 WEEKS WITH OB TRANSVAGINAL  Result Date: 01/30/2022 CLINICAL DATA:  Abdominal pain with weeks of vaginal spotting. EXAM: OBSTETRIC <14 WK [place] AND TRANSVAGINAL OB US TECHNIQUE: Both transabdominal and transvaginal ultrasound examinations were performed for complete evaluation of the gestation as well as the maternal uterus, adnexal regions, and pelvic cul-de-sac. Transvaginal technique was performed to assess early pregnancy. COMPARISON:  None Available. FINDINGS: Intrauterine gestational sac: None Yolk sac:  Not Visualized. Embryo:  Not Visualized. Cardiac Activity: Not Visualized. Heart Rate: N/A  bpm Maternal uterus/adnexae: The right ovary measures 4.3 cm x 4.2 cm x 3.9 cm and contains a 3.1 cm x 3.0 cm x 3.2 cm simple cyst. The left ovary measures 4.5 cm x 3.5 cm x 3.4 cm. An adjacent 4.4 cm x 2.7 cm x 4.3 cm heterogeneous echogenic mass is seen. Clear separation between this area and the left ovary could not be established, as per the ultrasound technologist. A moderate amount of complex pelvic free fluid is seen. IMPRESSION: 1. No evidence of an intrauterine pregnancy, with additional findings concerning for a ruptured left-sided ectopic pregnancy. 2.  Complex pelvic free fluid which may be hemorrhagic in nature. Electronically Signed   By: Aram Candela M.D.   On: 01/30/2022 03:58     MAU Management/MDM: I have reviewed the triage vital signs and the nursing notes.   Pertinent labs & imaging results that were available during my care of the patient were reviewed by me and considered  in my medical decision making (see chart for details).      I have reviewed her medical records including past results, notes and treatments.   Ordered usual first trimester r/o ectopic labs.   Will check baseline Ultrasound to rule out ectopic.  This revealed a ruptured Left ectopic pregnancy  Consult Dr Debroah Loop with presentation, exam findings, and results.  He will take patient to surgery (is here seeing patient now) Treatments in MAU included IV fluids, analgesia   This bleeding/pain can represent a normal pregnancy with bleeding, spontaneous abortion or even an ectopic which can be life-threatening.  The process as listed above helps to determine which of these is present.    ASSESSMENT Pregnancy at [redacted]w[redacted]d Left ruptured Ectopic pregnancy Pelvic pain in pregnancy Bleeding in pregnancy  PLAN Admit to OR MD to follow  Wynelle Bourgeois CNM, MSN Certified Nurse-Midwife 01/30/2022  2:28 AM

## 2022-01-30 NOTE — Discharge Instructions (Signed)
Office:  [phone]              OB-GYN

## 2022-01-30 NOTE — ED Notes (Addendum)
Report called to MAU and transport called. 

## 2022-01-30 NOTE — ED Provider Triage Note (Addendum)
Emergency Medicine Provider Triage Evaluation Note  Carla Rose , a 26 y.o. female  was evaluated in triage.  Pt complains of lower abdominal pain that has been present for a few weeks but worsening over the last few days.  Patient had her Nexplanon removed and attempt to try and get pregnant again and has had persistent vaginal bleeding that was initially heavier and now has been constant spotting, despite bleeding improving she has had worsening pain in her lower abdomen.  Tonight pain started radiating into her low back as well.  She also reports some vaginal discharge.  No dysuria.  No fevers.  No vomiting, normal bowel movements.  Patient reports pregnancy test at home have been negative.  Review of Systems  Positive: Lower abdominal pain, back pain, vaginal bleeding, vaginal discharge Negative: Chest pain, shortness of breath, fever, vomiting, diarrhea  Physical Exam  BP (!) 133/98 (BP Location: Right Arm)   Pulse 83   Temp 98.6 F (37 C) (Oral)   Resp 18   SpO2 100%  Gen:   Awake, no distress   Resp:  Normal effort  MSK:   Moves extremities without difficulty  Other:  Generalized abdominal tenderness, worse across the lower abdomen, does not last to 1 side, no guarding or peritoneal signs  Medical Decision Making  Medically screening exam initiated at 12:50 AM.  Appropriate orders placed.  Madden Piazza was informed that the remainder of the evaluation will be completed by another provider, this initial triage assessment does not replace that evaluation, and the importance of remaining in the ED until their evaluation is complete.     Dartha Lodge, [place] 01/30/22 4193   Patient's pregnancy test has returned positive, I have notified patient of this result, will send patient to the MAU for further evaluation of lower abdominal pain in the setting of pregnancy.  I spoke with MAU APP Hilda Lias who accepts patient to MAU for further evaluation.   Dartha Lodge, [place] 01/30/22  [phone]

## 2022-01-30 NOTE — H&P (Signed)
Chief Complaint: Abdominal Pain   Event Date/Time   First Provider Initiated Contact with Patient 01/30/22 0227        SUBJECTIVE HPI: Carla Rose is a 26 y.o. G4P3003 at Unknown by LMP who presents to maternity admissions reporting pelvic pain and vaginal bleeding.  Had her Nexplanon removed in March and has been trying to get pregnant.  Neg pregnancy test at home.  . She denies urinary symptoms, h/a, dizziness, n/v, or fever/chills.    Abdominal Pain This is a new problem. The current episode started 1 to 4 weeks ago. The problem occurs constantly. The problem has been gradually worsening. The pain is located in the suprapubic region, LLQ and RLQ. The quality of the pain is cramping and dull. Pertinent negatives include no fever, frequency, myalgias, nausea or vomiting. The pain is aggravated by palpation and movement. The pain is relieved by Nothing. She has tried nothing for the symptoms.   ED Note: Carla Rose , a 26 y.o. female  was evaluated in triage.  Pt complains of lower abdominal pain that has been present for a few weeks but worsening over the last few days.  Patient had her Nexplanon removed and attempt to try and get pregnant again and has had persistent vaginal bleeding that was initially heavier and now has been constant spotting, despite bleeding improving she has had worsening pain in her lower abdomen.  Tonight pain started radiating into her low back as well.  She also reports some vaginal discharge.  No dysuria.  No fevers.  No vomiting, normal bowel movements.  Patient reports pregnancy test at home have been negative.  Past Medical History:  Diagnosis Date   Anemia    Chlamydia    Gonorrhea    Hypertension    Pregnancy induced hypertension    Past Surgical History:  Procedure Laterality Date   NO PAST SURGERIES     Social History   Socioeconomic History   Marital status: Media planner    Spouse name: Not on file   Number of children: 3   Years  of education: Not on file   Highest education level: Not on file  Occupational History   Not on file  Tobacco Use   Smoking status: Former    Packs/day: 5.00    Types: Cigars, Cigarettes   Smokeless tobacco: Never  Vaping Use   Vaping Use: Never used  Substance and Sexual Activity   Alcohol use: No   Drug use: No   Sexual activity: Yes    Birth control/protection: None  Other Topics Concern   Not on file  Social History Narrative   Not on file   Social Determinants of Health   Financial Resource Strain: Medium Risk (10/28/2021)   Overall Financial Resource Strain (CARDIA)    Difficulty of Paying Living Expenses: Somewhat hard  Food Insecurity: No Food Insecurity (10/28/2021)   Hunger Vital Sign    Worried About Running Out of Food in the Last Year: Never true    Ran Out of Food in the Last Year: Never true  Transportation Needs: No Transportation Needs (10/28/2021)   PRAPARE - Administrator, Civil Service (Medical): No    Lack of Transportation (Non-Medical): No  Physical Activity: Insufficiently Active (10/28/2021)   Exercise Vital Sign    Days of Exercise per Week: 2 days    Minutes of Exercise per Session: 10 min  Stress: No Stress Concern Present (10/28/2021)   [employer] of Occupational Health -  Occupational Stress Questionnaire    Feeling of Stress : Not at all  Recent Concern: Stress - Stress Concern Present (10/10/2021)   [employer] - Occupational Stress Questionnaire    Feeling of Stress : Rather much  Social Connections: Moderately Integrated (10/28/2021)   Social Connection and Isolation Panel [NHANES]    Frequency of Communication with Friends and Family: More than three times a week    Frequency of Social Gatherings with Friends and Family: More than three times a week    Attends Religious Services: 1 to 4 times per year    Active Member of Clubs or Organizations: No    Attends Club or Organization Meetings: Never     Marital Status: Living with partner  Intimate Partner Violence: Not At Risk (10/28/2021)   Humiliation, Afraid, Rape, and Kick questionnaire    Fear of Current or Ex-Partner: No    Emotionally Abused: No    Physically Abused: No    Sexually Abused: No   No current facility-administered medications on file prior to encounter.   Current Outpatient Medications on File Prior to Encounter  Medication Sig Dispense Refill   acetaminophen (TYLENOL) 325 MG tablet Take 650 mg by mouth every 6 (six) hours as needed.     cetirizine (ZYRTEC ALLERGY) 10 MG tablet Take 1 tablet (10 mg total) by mouth daily. (Patient not taking: Reported on 10/28/2021) 30 tablet 0   flintstones complete (FLINTSTONES) 60 MG chewable tablet Chew 2 tablets by mouth daily.     naproxen (NAPROSYN) 375 MG tablet Take 1 tablet (375 mg total) by mouth 2 (two) times daily with a meal. (Patient not taking: Reported on 10/28/2021) 30 tablet 0   [DISCONTINUED] hydrochlorothiazide (MICROZIDE) 12.5 MG capsule Take 1 capsule (12.5 mg total) by mouth daily. (Patient not taking: Reported on 08/24/2018) 10 capsule 0   [DISCONTINUED] lisinopril (ZESTRIL) 10 MG tablet Take 1 tablet (10 mg total) by mouth daily. (Patient not taking: Reported on 08/24/2018) 30 tablet 0   No Known Allergies  I have reviewed patient's Past Medical Hx, Surgical Hx, Family Hx, Social Hx, medications and allergies.   ROS:  Review of Systems  Constitutional:  Negative for fever.  Gastrointestinal:  Positive for abdominal pain. Negative for nausea and vomiting.  Genitourinary:  Negative for frequency.  Musculoskeletal:  Negative for myalgias.   Review of Systems  Other systems negative   Physical Exam  Physical Exam Patient Vitals for the past 24 hrs:  BP Temp Temp src Pulse Resp SpO2 Height Weight  01/30/22 0052 -- -- -- -- -- -- 5' 8" (1.727 m) 89 kg  01/30/22 0039 (!) 133/98 98.6 F (37 C) Oral 83 18 100 % -- --   Constitutional: Well-developed,  well-nourished female in no acute distress, but uncomfortable  Cardiovascular: normal rate Respiratory: normal effort GI: Abd soft, non-tender.  MS: Extremities nontender, no edema, normal ROM Neurologic: Alert and oriented x 4.  GU: Neg CVAT.  PELVIC EXAM: deferred in lieu of transvaginal ultrasound.    LAB RESULTS Results for orders placed or performed during the hospital encounter of 01/30/22 (from the past 24 hour(s))  Comprehensive metabolic panel     Status: Abnormal   Collection Time: 01/30/22 12:57 AM  Result Value Ref Range   Sodium 136 135 - 145 mmol/L   Potassium 4.0 3.5 - 5.1 mmol/L   Chloride 104 98 - 111 mmol/L   CO2 23 22 - 32 mmol/L   Glucose, Bld 107 (H)   70 - 99 mg/dL   BUN 11 6 - 20 mg/dL   Creatinine, Ser 0.65 0.44 - 1.00 mg/dL   Calcium 9.9 8.9 - 10.3 mg/dL   Total Protein 6.7 6.5 - 8.1 g/dL   Albumin 4.0 3.5 - 5.0 g/dL   AST 17 15 - 41 U/L   ALT 19 0 - 44 U/L   Alkaline Phosphatase 73 38 - 126 U/L   Total Bilirubin 0.5 0.3 - 1.2 mg/dL   GFR, Estimated >60 >60 mL/min   Anion gap 9 5 - 15  CBC with Differential     Status: Abnormal   Collection Time: 01/30/22 12:57 AM  Result Value Ref Range   WBC 8.5 4.0 - 10.5 K/uL   RBC 3.98 3.87 - 5.11 MIL/uL   Hemoglobin 11.3 (L) 12.0 - 15.0 g/dL   HCT 35.1 (L) 36.0 - 46.0 %   MCV 88.2 80.0 - 100.0 fL   MCH 28.4 26.0 - 34.0 pg   MCHC 32.2 30.0 - 36.0 g/dL   RDW 13.2 11.5 - 15.5 %   Platelets 253 150 - 400 K/uL   nRBC 0.0 0.0 - 0.2 %   Neutrophils Relative % 72 %   Neutro Abs 6.2 1.7 - 7.7 K/uL   Lymphocytes Relative 16 %   Lymphs Abs 1.4 0.7 - 4.0 K/uL   Monocytes Relative 8 %   Monocytes Absolute 0.7 0.1 - 1.0 K/uL   Eosinophils Relative 2 %   Eosinophils Absolute 0.1 0.0 - 0.5 K/uL   Basophils Relative 1 %   Basophils Absolute 0.1 0.0 - 0.1 K/uL   Immature Granulocytes 1 %   Abs Immature Granulocytes 0.04 0.00 - 0.07 K/uL  Lipase, blood     Status: None   Collection Time: 01/30/22 12:57 AM  Result  Value Ref Range   Lipase 25 11 - 51 U/L  I-Stat beta hCG blood, ED     Status: Abnormal   Collection Time: 01/30/22  1:22 AM  Result Value Ref Range   I-stat hCG, quantitative 1,496.3 (H) <5 mIU/mL   Comment 3          hCG, quantitative, pregnancy     Status: Abnormal   Collection Time: 01/30/22  2:19 AM  Result Value Ref Range   hCG, Beta Chain, Quant, S 2,130 (H) <5 mIU/mL     IMAGING US OB LESS THAN 14 WEEKS WITH OB TRANSVAGINAL  Result Date: 01/30/2022 CLINICAL DATA:  Abdominal pain with weeks of vaginal spotting. EXAM: OBSTETRIC <14 WK US AND TRANSVAGINAL OB US TECHNIQUE: Both transabdominal and transvaginal ultrasound examinations were performed for complete evaluation of the gestation as well as the maternal uterus, adnexal regions, and pelvic cul-de-sac. Transvaginal technique was performed to assess early pregnancy. COMPARISON:  None Available. FINDINGS: Intrauterine gestational sac: None Yolk sac:  Not Visualized. Embryo:  Not Visualized. Cardiac Activity: Not Visualized. Heart Rate: N/A  bpm Maternal uterus/adnexae: The right ovary measures 4.3 cm x 4.2 cm x 3.9 cm and contains a 3.1 cm x 3.0 cm x 3.2 cm simple cyst. The left ovary measures 4.5 cm x 3.5 cm x 3.4 cm. An adjacent 4.4 cm x 2.7 cm x 4.3 cm heterogeneous echogenic mass is seen. Clear separation between this area and the left ovary could not be established, as per the ultrasound technologist. A moderate amount of complex pelvic free fluid is seen. IMPRESSION: 1. No evidence of an intrauterine pregnancy, with additional findings concerning for a ruptured left-sided ectopic pregnancy. 2.   Complex pelvic free fluid which may be hemorrhagic in nature. Electronically Signed   By: Aram Candela M.D.   On: 01/30/2022 03:58     MAU Management/MDM: I have reviewed the triage vital signs and the nursing notes.   Pertinent labs & imaging results that were available during my care of the patient were reviewed by me and considered  in my medical decision making (see chart for details).      I have reviewed her medical records including past results, notes and treatments.   Ordered usual first trimester r/o ectopic labs.   Will check baseline Ultrasound to rule out ectopic.  This revealed a ruptured Left ectopic pregnancy  Consult Dr Debroah Loop with presentation, exam findings, and results.  He will take patient to surgery (is here seeing patient now) Treatments in MAU included IV fluids, analgesia   This bleeding/pain can represent a normal pregnancy with bleeding, spontaneous abortion or even an ectopic which can be life-threatening.  The process as listed above helps to determine which of these is present.    ASSESSMENT Pregnancy at [redacted]w[redacted]d Left ruptured Ectopic pregnancy Pelvic pain in pregnancy Bleeding in pregnancy  PLAN Admit to OR MD to follow  Wynelle Bourgeois CNM, MSN Certified Nurse-Midwife 01/30/2022  2:28 AM

## 2022-01-30 NOTE — Anesthesia Procedure Notes (Signed)
Procedure Name: Intubation Date/Time: 01/30/2022 6:08 AM  Performed by: Marysa Wessner T, CRNAPre-anesthesia Checklist: Patient identified, Emergency Drugs available, Suction available and Patient being monitored Patient Re-evaluated:Patient Re-evaluated prior to induction Oxygen Delivery Method: Circle system utilized Preoxygenation: Pre-oxygenation with 100% oxygen Induction Type: IV induction, Rapid sequence and Cricoid Pressure applied Ventilation: Mask ventilation without difficulty Laryngoscope Size: Miller and 3 Grade View: Grade II Tube type: Oral Tube size: 7.5 mm Number of attempts: 1 Airway Equipment and Method: Stylet and Oral airway Placement Confirmation: ETT inserted through vocal cords under direct vision, positive ETCO2 and breath sounds checked- equal and bilateral Secured at: 22 cm Tube secured with: Tape Dental Injury: Teeth and Oropharynx as per pre-operative assessment

## 2022-01-30 NOTE — Transfer of Care (Signed)
Immediate Anesthesia Transfer of Care Note  Patient: Carla Rose  Procedure(s) Performed: LEFT LAPAROSCOPIC SALPINGECTOMY WITH REMOVAL OF ECTOPIC PREGNANCY (Left)  Patient Location: PACU  Anesthesia Type:General  Level of Consciousness: drowsy and patient cooperative  Airway & Oxygen Therapy: Patient Spontanous Breathing  Post-op Assessment: Report given to RN and Post -op Vital signs reviewed and stable  Post vital signs: Reviewed and stable  Last Vitals:  Vitals Value Taken Time  BP 120/84 01/30/22 0730  Temp    Pulse 92 01/30/22 0732  Resp 16 01/30/22 0732  SpO2 100 % 01/30/22 0732  Vitals shown include unvalidated device data.  Last Pain:  Vitals:   01/30/22 0233  TempSrc:   PainSc: 10-Worst pain ever         Complications: No notable events documented.

## 2022-01-31 ENCOUNTER — Encounter (HOSPITAL_COMMUNITY): Payer: Self-pay | Admitting: Obstetrics & Gynecology

## 2022-01-31 LAB — SURGICAL PATHOLOGY

## 2022-01-31 NOTE — Anesthesia Postprocedure Evaluation (Signed)
Anesthesia Post Note  Patient: Cassadee Vanzandt  Procedure(s) Performed: LEFT LAPAROSCOPIC SALPINGECTOMY WITH REMOVAL OF ECTOPIC PREGNANCY (Left)     Patient location during evaluation: PACU Anesthesia Type: General Level of consciousness: awake and alert Pain management: pain level controlled Vital Signs Assessment: post-procedure vital signs reviewed and stable Respiratory status: spontaneous breathing, nonlabored ventilation and respiratory function stable Cardiovascular status: blood pressure returned to baseline and stable Postop Assessment: no apparent nausea or vomiting Anesthetic complications: no   No notable events documented.  Last Vitals:  Vitals:   01/30/22 0830 01/30/22 0835  BP: 98/68 111/76  Pulse: 73 96  Resp: 13 (!) 21  Temp:  36.6 C  SpO2: 96% 99%    Last Pain:  Vitals:   01/30/22 0730  TempSrc:   PainSc: 0-No pain                 Druscilla Petsch,W. EDMOND

## 2022-02-09 ENCOUNTER — Encounter: Payer: Self-pay | Admitting: Women's Health

## 2022-03-17 ENCOUNTER — Encounter: Payer: Self-pay | Admitting: *Deleted

## 2022-03-18 ENCOUNTER — Ambulatory Visit (INDEPENDENT_AMBULATORY_CARE_PROVIDER_SITE_OTHER): Payer: BC Managed Care – PPO | Admitting: Women's Health

## 2022-03-18 ENCOUNTER — Encounter: Payer: Self-pay | Admitting: Women's Health

## 2022-03-18 VITALS — BP 137/92 | HR 79 | Ht 68.0 in | Wt 195.0 lb

## 2022-03-18 DIAGNOSIS — O009 Unspecified ectopic pregnancy without intrauterine pregnancy: Secondary | ICD-10-CM | POA: Diagnosis not present

## 2022-03-18 DIAGNOSIS — R030 Elevated blood-pressure reading, without diagnosis of hypertension: Secondary | ICD-10-CM

## 2022-03-18 DIAGNOSIS — Z4889 Encounter for other specified surgical aftercare: Secondary | ICD-10-CM

## 2022-03-18 NOTE — Progress Notes (Signed)
GYN VISIT Patient name: Carla Rose MRN [id number]  Date of birth: 05/04/1996 Chief Complaint:   post-op etopic  History of Present Illness:   Fahmida Jurich is a 26 y.o. [phone] Caucasian female being seen today for f/u after ruptured Lt ectopic w/ Lt salpingectomy on 7/6.  Wants to try again for another pregnancy. Taking pnv. Doing well emotionally. Has gone back to work. Has good support. Has not had sex yet. Period just started today. Knew bp was going to be elevated b/c was rushing to get here.  Patient's last menstrual period was 03/18/2022. The current method of family planning is none.  Last pap 10/28/21. Results were: ASCUS w/ HRHPV negative     10/28/2021   11:09 AM 10/10/2021    2:13 PM 02/25/2018    4:11 PM  Depression screen PHQ 2/9  Decreased Interest 0 0 0  Down, Depressed, Hopeless 0 0 0  PHQ - 2 Score 0 0 0  Altered sleeping 0 0 0  Tired, decreased energy 0 1 0  Change in appetite 0 0 0  Feeling bad or failure about yourself  0 1 0  Trouble concentrating 0 0 0  Moving slowly or fidgety/restless 0 0 0  Suicidal thoughts 0 0 0  PHQ-9 Score 0 2 0        10/28/2021   11:10 AM 10/10/2021    2:13 PM  GAD 7 : Generalized Anxiety Score  Nervous, Anxious, on Edge 0 0  Control/stop worrying 0 0  Worry too much - different things 0 0  Trouble relaxing 0 0  Restless 0 0  Easily annoyed or irritable 0 0  Afraid - awful might happen 0 0  Total GAD 7 Score 0 0     Review of Systems:   Pertinent items are noted in HPI Denies fever/chills, dizziness, headaches, visual disturbances, fatigue, shortness of breath, chest pain, abdominal pain, vomiting, abnormal vaginal discharge/itching/odor/irritation, problems with periods, bowel movements, urination, or intercourse unless otherwise stated above.  Pertinent History Reviewed:  Reviewed past medical,surgical, social, obstetrical and family history.  Reviewed problem list, medications and allergies. Physical Assessment:    Vitals:   03/18/22 1117 03/18/22 1208  BP: (!) 145/88 (!) 137/92  Pulse: 79   Weight: 195 lb (88.5 kg)   Height: 5\' 8"  (1.727 m)   Body mass index is 29.65 kg/m.       Physical Examination:   General appearance: alert, well appearing, and in no distress  Mental status: alert, oriented to person, place, and time  Skin: warm & dry   Cardiovascular: normal heart rate noted  Respiratory: normal respiratory effort, no distress  Abdomen: soft, non-tender, incisions well healed  Pelvic: examination not indicated  Extremities: no edema   Chaperone: N/A    No results found for this or any previous visit (from the past 24 hour(s)).  Assessment & Plan:  1) S/P Lt salpingectomy d/t ruptured ectopic on 7/6> wants to try for another pregnancy, still taking pnv. Will check hcg. When ready to start trying she can.   2) Elevated bp> still slightly elevated on repeat, to check daily at home, keep log. If not pregnant in next month, call back for another appt. Does not have a PCP  Meds: No orders of the defined types were placed in this encounter.   Orders Placed This Encounter  Procedures   Beta hCG quant (ref lab)    Return for prn.  CNM, [facility] 03/18/2022 12:12  PM  

## 2022-03-19 LAB — BETA HCG QUANT (REF LAB): hCG Quant: <1 m[IU]/mL

## 2022-05-19 ENCOUNTER — Ambulatory Visit (INDEPENDENT_AMBULATORY_CARE_PROVIDER_SITE_OTHER): Payer: Self-pay | Admitting: Women's Health

## 2022-05-19 ENCOUNTER — Other Ambulatory Visit (HOSPITAL_COMMUNITY)
Admission: RE | Admit: 2022-05-19 | Discharge: 2022-05-19 | Disposition: A | Discharge status: Home / Self Care | Payer: Self-pay | Source: Ambulatory Visit | Attending: Women's Health | Admitting: Women's Health

## 2022-05-19 ENCOUNTER — Encounter: Payer: Self-pay | Admitting: Women's Health

## 2022-05-19 VITALS — BP 124/84 | HR 80 | Ht 68.0 in | Wt 194.0 lb

## 2022-05-19 DIAGNOSIS — R102 Pelvic and perineal pain: Secondary | ICD-10-CM

## 2022-05-19 DIAGNOSIS — Z3202 Encounter for pregnancy test, result negative: Secondary | ICD-10-CM

## 2022-05-19 DIAGNOSIS — N926 Irregular menstruation, unspecified: Secondary | ICD-10-CM

## 2022-05-19 LAB — POCT URINE PREGNANCY: Preg Test, Ur: NEGATIVE

## 2022-05-19 NOTE — Progress Notes (Signed)
GYN VISIT Patient name: Carla Rose MRN [id number]  Date of birth: [birth year] Chief Complaint:   pain on right side (LMP 03-09-22)  History of Present Illness:   Carla Rose is a 26 y.o. [phone] Caucasian female being seen today for report of intermittent sharp RLQ pain x 1.5-2wks. Feels like gas pains, which is the same as what it felt like w/ Lt ectopic pregnancy in July of this year, had Lt salpingectomy 01/30/22. Had 1 period since that, on 8/22, no bleeding since. Had regular periods prior. Is trying to get pregnant. Taking pnv daily. No fever/chills/n/v. Has been bloated and having constipation.   Had small bm yesterday. Denies abnormal discharge, itching/odor/irritation.    Patient's last menstrual period was 04/09/2022. The current method of family planning is none.  Last pap 10/28/21. Results were: ASCUS w/ HRHPV negative     10/28/2021   11:09 AM 10/10/2021    2:13 PM 02/25/2018    4:11 PM  Depression screen PHQ 2/9  Decreased Interest 0 0 0  Down, Depressed, Hopeless 0 0 0  PHQ - 2 Score 0 0 0  Altered sleeping 0 0 0  Tired, decreased energy 0 1 0  Change in appetite 0 0 0  Feeling bad or failure about yourself  0 1 0  Trouble concentrating 0 0 0  Moving slowly or fidgety/restless 0 0 0  Suicidal thoughts 0 0 0  PHQ-9 Score 0 2 0        10/28/2021   11:10 AM 10/10/2021    2:13 PM  GAD 7 : Generalized Anxiety Score  Nervous, Anxious, on Edge 0 0  Control/stop worrying 0 0  Worry too much - different things 0 0  Trouble relaxing 0 0  Restless 0 0  Easily annoyed or irritable 0 0  Afraid - awful might happen 0 0  Total GAD 7 Score 0 0     Review of Systems:   Pertinent items are noted in HPI Denies fever/chills, dizziness, headaches, visual disturbances, fatigue, shortness of breath, chest pain, abdominal pain, vomiting, abnormal vaginal discharge/itching/odor/irritation, problems with periods, bowel movements, urination, or intercourse unless otherwise stated  above.  Pertinent History Reviewed:  Reviewed past medical,surgical, social, obstetrical and family history.  Reviewed problem list, medications and allergies. Physical Assessment:   Vitals:   05/19/22 0937  BP: 124/84  Pulse: 80  Weight: 194 lb (88 kg)  Height: 5\' 8"  (1.727 m)  Body mass index is 29.5 kg/m.       Physical Examination:   General appearance: alert, well appearing, and in no distress  Mental status: alert, oriented to person, place, and time  Skin: warm & dry   Cardiovascular: normal heart rate noted  Respiratory: normal respiratory effort, no distress  Abdomen: soft, very mild tenderness to deep palpation RLQ  Pelvic: VULVA: normal appearing vulva with no masses, tenderness or lesions, VAGINA: normal appearing vagina with normal color and light pinkish/tan discharge, no lesions, CERVIX: normal appearing cervix without discharge or lesions, UTERUS: uterus is normal size, shape, consistency and nontender, ADNEXA: normal adnexa in size, nontender and no masses  Extremities: no edema   Chaperone:    Results for orders placed or performed in visit on 05/19/22 (from the past 24 hour(s))  POCT urine pregnancy   Collection Time: 05/19/22  9:52 AM  Result Value Ref Range   Preg Test, Ur Negative Negative    Assessment & Plan:  1) Intermittent sharp RLQ pain, late period  w/ h/o Lt ectopic> UPT neg, declines HCG d/t currently being self-pay- discussed w/o I can't completely r/o ectopic, she understands this and is instructed to go to ED if severe pain w/ or w/o bleeding. CV swab sent, is ok w/ charge for this. Low suspicion for appendicitis, instructed if fever/chills/n/v or severe pain- go to ED. Discussed if CV swab neg, pain continues but no worsening, let me know, next step would be pelvic u/s, which of course would be out of pocket. If no period, and neg HPT by 11/13, let me know, will need to start period w/ provera.   Meds: No orders of the defined types  were placed in this encounter.   Orders Placed This Encounter  Procedures   POCT urine pregnancy    Return for after 4/3, Physical.  Roma Schanz CNM, [facility] 05/19/2022 10:21 AM

## 2022-05-20 LAB — CERVICOVAGINAL ANCILLARY ONLY
Bacterial Vaginitis (gardnerella): POSITIVE — AB
Candida Glabrata: NEGATIVE
Candida Vaginitis: NEGATIVE
Chlamydia: NEGATIVE
Comment: NEGATIVE
Comment: NEGATIVE
Comment: NEGATIVE
Comment: NEGATIVE
Comment: NEGATIVE
Comment: NORMAL
Neisseria Gonorrhea: NEGATIVE
Trichomonas: NEGATIVE

## 2022-05-20 MED ORDER — METRONIDAZOLE 500 MG PO TABS: 500.0000 mg | ORAL_TABLET | Freq: Two times a day (BID) | ORAL | 0 refills | Status: DC

## 2022-05-20 NOTE — Addendum Note (Signed)
Addended by: Wells Guiles R on: 05/20/2022 01:10 PM   Modules accepted: Orders

## 2022-07-03 ENCOUNTER — Emergency Department (HOSPITAL_COMMUNITY)
Admission: EM | Admit: 2022-07-03 | Discharge: 2022-07-03 | Disposition: A | Discharge status: Home / Self Care | Payer: Medicaid Other | Attending: Emergency Medicine | Admitting: Emergency Medicine

## 2022-07-03 ENCOUNTER — Other Ambulatory Visit: Payer: Self-pay

## 2022-07-03 DIAGNOSIS — R112 Nausea with vomiting, unspecified: Secondary | ICD-10-CM | POA: Diagnosis not present

## 2022-07-03 DIAGNOSIS — R197 Diarrhea, unspecified: Secondary | ICD-10-CM | POA: Insufficient documentation

## 2022-07-03 DIAGNOSIS — R300 Dysuria: Secondary | ICD-10-CM | POA: Insufficient documentation

## 2022-07-03 DIAGNOSIS — B349 Viral infection, unspecified: Secondary | ICD-10-CM | POA: Insufficient documentation

## 2022-07-03 DIAGNOSIS — R0981 Nasal congestion: Secondary | ICD-10-CM

## 2022-07-03 DIAGNOSIS — R3989 Other symptoms and signs involving the genitourinary system: Secondary | ICD-10-CM | POA: Insufficient documentation

## 2022-07-03 DIAGNOSIS — Z20822 Contact with and (suspected) exposure to covid-19: Secondary | ICD-10-CM | POA: Insufficient documentation

## 2022-07-03 DIAGNOSIS — R000 Tachycardia, unspecified: Secondary | ICD-10-CM | POA: Insufficient documentation

## 2022-07-03 LAB — CBC WITH DIFFERENTIAL/PLATELET
Abs Immature Granulocytes: 0.02 10*3/uL (ref 0.00–0.07)
Basophils Absolute: 0 10*3/uL (ref 0.0–0.1)
Basophils Relative: 1 %
Eosinophils Absolute: 0.1 10*3/uL (ref 0.0–0.5)
Eosinophils Relative: 1 %
HCT: 36.5 % (ref 36.0–46.0)
Hemoglobin: 12.2 g/dL (ref 12.0–15.0)
Immature Granulocytes: 1 %
Lymphocytes Relative: 8 %
Lymphs Abs: 0.4 10*3/uL — ABNORMAL LOW (ref 0.7–4.0)
MCH: 27.8 pg (ref 26.0–34.0)
MCHC: 33.4 g/dL (ref 30.0–36.0)
MCV: 83.1 fL (ref 80.0–100.0)
Monocytes Absolute: 0.5 10*3/uL (ref 0.1–1.0)
Monocytes Relative: 11 %
Neutro Abs: 3.4 10*3/uL (ref 1.7–7.7)
Neutrophils Relative %: 78 %
Platelets: 219 10*3/uL (ref 150–400)
RBC: 4.39 MIL/uL (ref 3.87–5.11)
RDW: 14 % (ref 11.5–15.5)
WBC: 4.4 10*3/uL (ref 4.0–10.5)
nRBC: 0 % (ref 0.0–0.2)

## 2022-07-03 LAB — BASIC METABOLIC PANEL
Anion gap: 11 (ref 5–15)
BUN: 7 mg/dL (ref 6–20)
CO2: 21 mmol/L — ABNORMAL LOW (ref 22–32)
Calcium: 9.4 mg/dL (ref 8.9–10.3)
Chloride: 100 mmol/L (ref 98–111)
Creatinine, Ser: 0.57 mg/dL (ref 0.44–1.00)
GFR, Estimated: >60 mL/min (ref >60)
Glucose, Bld: 102 mg/dL — ABNORMAL HIGH (ref 70–99)
Potassium: 3.6 mmol/L (ref 3.5–5.1)
Sodium: 132 mmol/L — ABNORMAL LOW (ref 135–145)

## 2022-07-03 LAB — POC URINE PREG, ED: Preg Test, Ur: POSITIVE — AB

## 2022-07-03 LAB — URINALYSIS, ROUTINE W REFLEX MICROSCOPIC
Bilirubin Urine: NEGATIVE
Glucose, UA: NEGATIVE mg/dL
Hgb urine dipstick: NEGATIVE
Ketones, ur: NEGATIVE mg/dL
Leukocytes,Ua: NEGATIVE
Nitrite: NEGATIVE
Protein, ur: NEGATIVE mg/dL
Specific Gravity, Urine: 1.008 (ref 1.005–1.030)
pH: 7 (ref 5.0–8.0)

## 2022-07-03 LAB — GROUP A STREP BY PCR: Group A Strep by PCR: NOT DETECTED

## 2022-07-03 LAB — SARS CORONAVIRUS 2 BY RT PCR: SARS Coronavirus 2 by RT PCR: NEGATIVE

## 2022-07-03 MED ORDER — ACETAMINOPHEN 325 MG PO TABS
650.0000 mg | ORAL_TABLET | Freq: Once | ORAL | Status: AC
  Administered 2022-07-03: 650 mg via ORAL
  Filled 2022-07-03: qty 2

## 2022-07-03 MED ORDER — SODIUM CHLORIDE 0.9 % IV BOLUS
1000.0000 mL | Freq: Once | INTRAVENOUS | Status: AC
  Administered 2022-07-03: 1000 mL via INTRAVENOUS

## 2022-07-03 NOTE — Discharge Instructions (Signed)
Please schedule an appointment with your OB/GYN for further evaluation.  As we discussed, you may take Tylenol for pain.  Avoid NSAIDs such as ibuprofen or Motrin.  Please drink plenty of water and get plenty of rest.  Return to the emergency room for any worsening symptoms you might have.

## 2022-07-03 NOTE — ED Triage Notes (Signed)
Pt presents with headache, sore, abdominal pain, since yesterday, children have been recently sick also.

## 2022-07-03 NOTE — ED Provider Notes (Signed)
[facility] EMERGENCY DEPARTMENT Provider Note   CSN: [id number] Arrival date & time: 07/03/22  1220     History Chief Complaint  Patient presents with   Flu-Like Symptoms    Carla Rose is a 26 y.o. female patient presents to the emergency department.  Patient describes nasal congestion, cough, general malaise, and myalgias over the last 24 hours. She reports associated nausea, vomiting, and diarrhea. There are positive sick contacts at home. She also is concerned she might be pregnant and would like a pregnancy test. She also mentions some dysuria and bladder pain.   HPI     Home Medications Prior to Admission medications   Medication Sig Start Date End Date Taking? Authorizing Provider  acetaminophen (TYLENOL) 325 MG tablet Take 650 mg by mouth every 6 (six) hours as needed. Patient not taking: Reported on 05/19/2022    [provider]  cetirizine (ZYRTEC ALLERGY) 10 MG tablet Take 1 tablet (10 mg total) by mouth daily. Patient not taking: Reported on 10/28/2021 07/30/21   Jaynee Eagles, PA-C  flintstones complete (FLINTSTONES) 60 MG chewable tablet Chew 2 tablets by mouth daily. Patient not taking: Reported on 05/19/2022    [provider]  metroNIDAZOLE (FLAGYL) 500 MG tablet Take 1 tablet (500 mg total) by mouth 2 (two) times daily. 05/20/22   Roma Schanz, CNM  naproxen (NAPROSYN) 375 MG tablet Take 1 tablet (375 mg total) by mouth 2 (two) times daily with a meal. Patient not taking: Reported on 10/28/2021 07/30/21   Jaynee Eagles, PA-C  oxyCODONE-acetaminophen (PERCOCET/ROXICET) 5-325 MG tablet Take 1 tablet by mouth every 4 (four) hours as needed for severe pain. Patient not taking: Reported on 05/19/2022 01/30/22   Woodroe Mode, MD  hydrochlorothiazide (MICROZIDE) 12.5 MG capsule Take 1 capsule (12.5 mg total) by mouth daily. Patient not taking: Reported on 08/24/2018 07/27/18 11/14/20  Jonnie Kind, MD  lisinopril (ZESTRIL) 10 MG tablet Take 1 tablet  (10 mg total) by mouth daily. Patient not taking: Reported on 08/24/2018 07/27/18 11/14/20  Jonnie Kind, MD      Allergies    Patient has no known allergies.    Review of Systems   Review of Systems  All other systems reviewed and are negative.   Physical Exam Updated Vital Signs BP (!) 137/100   Pulse (!) 118   Temp 99.6 F (37.6 C) (Oral)   Resp 18   Ht 5\' 8"  (1.727 m)   Wt 85.7 kg   LMP 05/28/2022 (Approximate)   SpO2 100%   BMI 28.74 kg/m  Physical Exam Vitals and nursing note reviewed.  Constitutional:      General: She is not in acute distress.    Appearance: Normal appearance.  HENT:     Head: Normocephalic and atraumatic.  Eyes:     General:        Right eye: No discharge.        Left eye: No discharge.     Conjunctiva/sclera: Conjunctivae normal.  Cardiovascular:     Rate and Rhythm: Tachycardia present.     Comments: S1/S2 are distinct without any evidence of murmur, rubs, or gallops.  Radial pulses are 2+ bilaterally.  Dorsalis pedis pulses are 2+ bilaterally.  No evidence of pedal edema. Pulmonary:     Effort: Pulmonary effort is normal.     Comments: Clear to auscultation bilaterally.  Normal effort.  No respiratory distress.  No evidence of wheezes, rales, or rhonchi heard throughout. Abdominal:  General: Abdomen is flat. Bowel sounds are normal. There is no distension.     Tenderness: There is no abdominal tenderness. There is no guarding or rebound.  Musculoskeletal:        General: Normal range of motion.     Cervical back: Neck supple.  Skin:    General: Skin is warm and dry.     Findings: No rash.  Neurological:     General: No focal deficit present.     Mental Status: She is alert.  Psychiatric:        Mood and Affect: Mood normal.        Behavior: Behavior normal.     ED Results / Procedures / Treatments   Labs (all labs ordered are listed, but only abnormal results are displayed) Labs Reviewed  URINALYSIS, ROUTINE W REFLEX  MICROSCOPIC - Abnormal; Notable for the following components:      Result Value   Color, Urine STRAW (*)    All other components within normal limits  CBC WITH DIFFERENTIAL/PLATELET - Abnormal; Notable for the following components:   Lymphs Abs 0.4 (*)    All other components within normal limits  BASIC METABOLIC PANEL - Abnormal; Notable for the following components:   Sodium 132 (*)    CO2 21 (*)    Glucose, Bld 102 (*)    All other components within normal limits  POC URINE PREG, ED - Abnormal; Notable for the following components:   Preg Test, Ur POSITIVE (*)    All other components within normal limits  SARS CORONAVIRUS 2 BY RT PCR  GROUP A STREP BY PCR    EKG None  Radiology No results found.  Procedures Procedures    Medications Ordered in ED Medications  sodium chloride 0.9 % bolus 1,000 mL (1,000 mLs Intravenous New Bag/Given 07/03/22 1419)  acetaminophen (TYLENOL) tablet 650 mg (650 mg Oral Given 07/03/22 1413)    ED Course/ Medical Decision Making/ A&P Clinical Course as of 07/03/22 1643  Thu Jul 03, 2022  1503 CBC with Differential(!) Normal. [CF]  123456 Basic metabolic panel(!) Mild hyponatremia and elevated glucose slightly. [CF]  1503 SARS Coronavirus 2 by RT PCR (hospital order, performed in [facility] hospital lab) *cepheid single result test* Anterior Nasal Swab Negative. [CF]  1503 Group A Strep by PCR Negative. [CF]  1503 Urinalysis, Routine w reflex microscopic Urine, Clean Catch(!) Normal. [CF]  1552 On reevaluation, patient is feeling somewhat better after fluids and Tylenol.  I went over all labs with her at the bedside.  Patient did state that she needs to urinate so we can obtain the point-of-care pregnancy. [CF]  [id number] POC Urine Pregnancy, ED (not at [facility])(!) This is positive.  Notified patient of this.  She states that she has been trying.  She does have an OB and will follow-up with them.  She denies any vaginal bleeding or lower abdominal  pain. [CF]    Clinical Course User Index [CF] Hendricks Limes, PA-C                           Medical Decision Making Carla Rose is a 26 y.o. female patient who presents to the emergency department today for further evaluation of general myalgias, malaise, nasal congestion, and cough.  I am concerned for upper respiratory illness.  Likely viral. lungs were clear to auscultation and not feel that pneumonia does have a differential at this time.  Will swab her  for COVID, flu.  Will also get a point-of-care thank you test addition to urine as she was complaining of some dysuria.  She is in no acute distress at this time. Will start off with bolus of fluid and tylenol.   Patient feeling better after fluids and Tylenol as hide in ED course.  She will follow-up with her OB regarding pregnancy.  This is likely a viral illness.  I have a low suspicion for ectopic pregnancy at this time.  He is having no vaginal bleeding or lower abdominal pain.  Strict return precautions were discussed.  She safe for discharge.  Amount and/or Complexity of Data Reviewed Labs: ordered. Decision-making details documented in ED Course.  Risk OTC drugs.   Final Clinical Impression(s) / ED Diagnoses Final diagnoses:  Nasal congestion  Viral syndrome    Rx / DC Orders ED Discharge Orders     None         Teressa Lower, [place] 07/03/22 1643    Terald Sleeper, MD 07/03/22 [id number]

## 2022-07-08 DIAGNOSIS — Z3A08 8 weeks gestation of pregnancy: Secondary | ICD-10-CM | POA: Insufficient documentation

## 2022-07-08 DIAGNOSIS — Z87891 Personal history of nicotine dependence: Secondary | ICD-10-CM | POA: Diagnosis not present

## 2022-07-08 DIAGNOSIS — I10 Essential (primary) hypertension: Secondary | ICD-10-CM | POA: Diagnosis not present

## 2022-07-08 DIAGNOSIS — Z3A01 Less than 8 weeks gestation of pregnancy: Secondary | ICD-10-CM | POA: Diagnosis not present

## 2022-07-08 DIAGNOSIS — O209 Hemorrhage in early pregnancy, unspecified: Secondary | ICD-10-CM | POA: Insufficient documentation

## 2022-07-09 ENCOUNTER — Emergency Department (HOSPITAL_COMMUNITY)
Admission: EM | Admit: 2022-07-09 | Discharge: 2022-07-09 | Disposition: A | Discharge status: Home / Self Care | Payer: Medicaid Other | Attending: Emergency Medicine | Admitting: Emergency Medicine

## 2022-07-09 ENCOUNTER — Other Ambulatory Visit: Payer: Self-pay

## 2022-07-09 ENCOUNTER — Emergency Department (HOSPITAL_COMMUNITY): Payer: Medicaid Other

## 2022-07-09 ENCOUNTER — Encounter (HOSPITAL_COMMUNITY): Payer: Self-pay

## 2022-07-09 DIAGNOSIS — O4690 Antepartum hemorrhage, unspecified, unspecified trimester: Secondary | ICD-10-CM

## 2022-07-09 LAB — BASIC METABOLIC PANEL
Anion gap: 8 (ref 5–15)
BUN: 6 mg/dL (ref 6–20)
CO2: 26 mmol/L (ref 22–32)
Calcium: 8.8 mg/dL — ABNORMAL LOW (ref 8.9–10.3)
Chloride: 101 mmol/L (ref 98–111)
Creatinine, Ser: 0.58 mg/dL (ref 0.44–1.00)
GFR, Estimated: >60 mL/min (ref >60)
Glucose, Bld: 109 mg/dL — ABNORMAL HIGH (ref 70–99)
Potassium: 3.4 mmol/L — ABNORMAL LOW (ref 3.5–5.1)
Sodium: 135 mmol/L (ref 135–145)

## 2022-07-09 LAB — CBC
HCT: 35 % — ABNORMAL LOW (ref 36.0–46.0)
Hemoglobin: 11.9 g/dL — ABNORMAL LOW (ref 12.0–15.0)
MCH: 28.3 pg (ref 26.0–34.0)
MCHC: 34 g/dL (ref 30.0–36.0)
MCV: 83.1 fL (ref 80.0–100.0)
Platelets: 177 10*3/uL (ref 150–400)
RBC: 4.21 MIL/uL (ref 3.87–5.11)
RDW: 13.6 % (ref 11.5–15.5)
WBC: 8 10*3/uL (ref 4.0–10.5)
nRBC: 0 % (ref 0.0–0.2)

## 2022-07-09 LAB — HCG, QUANTITATIVE, PREGNANCY: hCG, Beta Chain, Quant, S: 64555 m[IU]/mL — ABNORMAL HIGH (ref <5)

## 2022-07-09 LAB — POC URINE PREG, ED: Preg Test, Ur: POSITIVE — AB

## 2022-07-09 MED ORDER — ACETAMINOPHEN 500 MG PO TABS
1000.0000 mg | ORAL_TABLET | Freq: Once | ORAL | Status: AC
  Administered 2022-07-09: 1000 mg via ORAL
  Filled 2022-07-09: qty 2

## 2022-07-09 NOTE — Discharge Instructions (Signed)
You were evaluated in the Emergency Department and after careful evaluation, we did not find any emergent condition requiring admission or further testing in the hospital.  Your exam/testing today is overall reassuring.  Recommend close follow-up with OB/GYN for repeat blood testing or ultrasound within the next few weeks.  Please return to the Emergency Department if you experience any worsening of your condition.   Thank you for allowing [place] to be a part of your care.

## 2022-07-09 NOTE — ED Provider Notes (Signed)
[facility] Emergency Department Provider Note MRN:  [id number]  Arrival date & time: 07/09/22     Chief Complaint   Vaginal Bleeding   History of Present Illness   Carla Rose is a 26 y.o. year-old female with a history of hypertension, ectopic pregnancy presenting to the ED with chief complaint of vaginal bleeding.  Vaginal bleeding that started this evening.  She is estimated 6 to [redacted] weeks pregnant.  Denies any significant abdominal pain.  She has been having cough and nasal congestion and headaches, thinks she has some kind of virus.  Review of Systems  A thorough review of systems was obtained and all systems are negative except as noted in the HPI and PMH.   Patient's Health History    Past Medical History:  Diagnosis Date   Anemia    Chlamydia    Gonorrhea    Hypertension    Pregnancy induced hypertension     Past Surgical History:  Procedure Laterality Date   LAPAROSCOPIC UNILATERAL SALPINGECTOMY Left 01/30/2022   Procedure: LEFT LAPAROSCOPIC SALPINGECTOMY WITH REMOVAL OF ECTOPIC PREGNANCY;  Surgeon: Woodroe Mode, MD;  Location: [place];  Service: Gynecology;  Laterality: Left;   NO PAST SURGERIES      Family History  Problem Relation Age of Onset   Crohn's disease Father    Crohn's disease Sister    Heart disease Paternal Grandmother     Social History   Socioeconomic History   Marital status: Single    Spouse name: Not on file   Number of children: 3   Years of education: Not on file   Highest education level: Not on file  Occupational History   Not on file  Tobacco Use   Smoking status: Former    Packs/day: 5.00    Types: Cigars, Cigarettes   Smokeless tobacco: Never  Vaping Use   Vaping Use: Never used  Substance and Sexual Activity   Alcohol use: No   Drug use: No   Sexual activity: Yes    Birth control/protection: None  Other Topics Concern   Not on file  Social History Narrative   Not on file   Social  Determinants of Health   Financial Resource Strain: Medium Risk (10/28/2021)   Overall Financial Resource Strain (CARDIA)    Difficulty of Paying Living Expenses: Somewhat hard  Food Insecurity: No Food Insecurity (10/28/2021)   Hunger Vital Sign    Worried About Running Out of Food in the Last Year: Never true    Ran Out of Food in the Last Year: Never true  Transportation Needs: No Transportation Needs (10/28/2021)   PRAPARE - Hydrologist (Medical): No    Lack of Transportation (Non-Medical): No  Physical Activity: Insufficiently Active (10/28/2021)   Exercise Vital Sign    Days of Exercise per Week: 2 days    Minutes of Exercise per Session: 10 min  Stress: No Stress Concern Present (10/28/2021)   [place]    Feeling of Stress : Not at all  Recent Concern: Stress - Stress Concern Present (10/10/2021)   [place]    Feeling of Stress : Rather much  Social Connections: Moderately Integrated (10/28/2021)   Social Connection and Isolation Panel [NHANES]    Frequency of Communication with Friends and Family: More than three times a week    Frequency of Social Gatherings with Friends and Family:  More than three times a week    Attends Religious Services: 1 to 4 times per year    Active Member of Clubs or Organizations: No    Attends Archivist Meetings: Never    Marital Status: Living with partner  Intimate Partner Violence: Not At Risk (10/28/2021)   Humiliation, Afraid, Rape, and Kick questionnaire    Fear of Current or Ex-Partner: No    Emotionally Abused: No    Physically Abused: No    Sexually Abused: No     Physical Exam   Vitals:   07/09/22 0016  BP: (!) 155/93  Pulse: (!) 112  Resp: (!) 22  Temp: 99 F (37.2 C)  SpO2: 100%    CONSTITUTIONAL: Well-appearing, NAD NEURO/PSYCH:  Alert and oriented x 3, no  focal deficits EYES:  eyes equal and reactive ENT/NECK:  no LAD, no JVD CARDIO: Regular rate, well-perfused, normal S1 and S2 PULM:  CTAB no wheezing or rhonchi GI/GU:  non-distended, non-tender MSK/SPINE:  No gross deformities, no edema SKIN:  no rash, atraumatic   *Additional and/or pertinent findings included in MDM below  Diagnostic and Interventional Summary    EKG Interpretation  Date/Time:    Ventricular Rate:    PR Interval:    QRS Duration:   QT Interval:    QTC Calculation:   R Axis:     Text Interpretation:         Labs Reviewed  CBC - Abnormal; Notable for the following components:      Result Value   Hemoglobin 11.9 (*)    HCT 35.0 (*)    All other components within normal limits  BASIC METABOLIC PANEL - Abnormal; Notable for the following components:   Potassium 3.4 (*)    Glucose, Bld 109 (*)    Calcium 8.8 (*)    All other components within normal limits  HCG, QUANTITATIVE, PREGNANCY - Abnormal; Notable for the following components:   hCG, Beta Chain, Quant, S 64,555 (*)    All other components within normal limits  POC URINE PREG, ED - Abnormal; Notable for the following components:   Preg Test, Ur POSITIVE (*)    All other components within normal limits    US OB LESS THAN 14 WEEKS WITH OB TRANSVAGINAL  Final Result      Medications  acetaminophen (TYLENOL) tablet 1,000 mg (has no administration in time range)     Procedures  /  Critical Care Procedures  ED Course and Medical Decision Making  Initial Impression and Ddx Very well-appearing, reassuring vital signs, no abdominal tenderness.  Differential diagnosis includes ectopic pregnancy, miscarriage, bleeding in pregnancy.  She also likely has a viral illness given her other symptoms.  Past medical/surgical history that increases complexity of ED encounter: Ectopic pregnancy  Interpretation of Diagnostics I personally reviewed the laboratory assessment and my interpretation is as  follows: No significant blood count or electrolyte disturbance  hCG quant obtained, she is O+ and so no need for RhoGAM.  Patient Reassessment and Ultimate Disposition/Management     Given the reassuring ultrasound demonstrating live intrauterine pregnancy, patient is appropriate for discharge with close follow-up.  We discussed the possibility of this being an early miscarriage, she will return with any worsening symptoms.  Patient management required discussion with the following services or consulting groups:  None  Complexity of Problems Addressed Acute illness or injury that poses threat of life of bodily function  Additional Data Reviewed and Analyzed Further history obtained from: Further  history from spouse/family member  Additional Factors Impacting ED Encounter Risk None  Elmer Sow. Pilar Plate, MD [facility] Emergency Medicine [facility] [email or web address]  Final Clinical Impressions(s) / ED Diagnoses     ICD-10-CM   1. Vaginal bleeding in pregnancy  O46.90       ED Discharge Orders     None        Discharge Instructions Discussed with and Provided to Patient:    Discharge Instructions      You were evaluated in the Emergency Department and after careful evaluation, we did not find any emergent condition requiring admission or further testing in the hospital.  Your exam/testing today is overall reassuring.  Recommend close follow-up with OB/GYN for repeat blood testing or ultrasound within the next few weeks.  Please return to the Emergency Department if you experience any worsening of your condition.   Thank you for allowing [place] to be a part of your care.      Sabas Sous, MD 07/09/22 [phone]

## 2022-07-09 NOTE — ED Notes (Signed)
Patient transported to Ultrasound 

## 2022-07-09 NOTE — ED Triage Notes (Signed)
Pt c/o vaginal bleeding and abdominal cramping. States she is approx. [redacted] weeks pregnant but has not been to OB. Pt did have an ectopic pregnancy this past July.

## 2022-07-28 NOTE — L&D Delivery Note (Signed)
Delivery Note 27 y.o. [id number] at [redacted]w[redacted]d admitted for IOL for gHTN.   At 6:43 AM a viable female was delivered via Vaginal, Spontaneous (Presentation: Right Occiput Anterior).  APGAR: 8, 9; weight 8 lb 12.7 oz (3990 g).   Placenta status: Spontaneous, Intact.  Cord: 3 vessels with the following complications: None.  Cord pH: not collected  Anesthesia: Epidural Episiotomy: None Lacerations: None Suture Repair:  n/a Est. Blood Loss (mL): 122  Mom to postpartum.  Baby to Couplet care / Skin to Skin.  Sheppard Evens MD MPH OB Fellow, Faculty Practice [facility], [facility] 02/24/2023

## 2022-08-18 ENCOUNTER — Encounter: Payer: Self-pay | Admitting: Women's Health

## 2022-08-18 ENCOUNTER — Ambulatory Visit (INDEPENDENT_AMBULATORY_CARE_PROVIDER_SITE_OTHER): Payer: Medicaid Other | Admitting: Women's Health

## 2022-08-18 VITALS — BP 126/80 | HR 96 | Ht 68.0 in | Wt 189.4 lb

## 2022-08-18 DIAGNOSIS — Z3A11 11 weeks gestation of pregnancy: Secondary | ICD-10-CM

## 2022-08-18 DIAGNOSIS — Z3481 Encounter for supervision of other normal pregnancy, first trimester: Secondary | ICD-10-CM

## 2022-08-18 DIAGNOSIS — R102 Pelvic and perineal pain: Secondary | ICD-10-CM

## 2022-08-18 DIAGNOSIS — Z8759 Personal history of other complications of pregnancy, childbirth and the puerperium: Secondary | ICD-10-CM | POA: Diagnosis not present

## 2022-08-18 DIAGNOSIS — Z3490 Encounter for supervision of normal pregnancy, unspecified, unspecified trimester: Secondary | ICD-10-CM | POA: Insufficient documentation

## 2022-08-18 DIAGNOSIS — R82998 Other abnormal findings in urine: Secondary | ICD-10-CM

## 2022-08-18 LAB — POCT URINALYSIS DIPSTICK OB
Blood, UA: NEGATIVE
Glucose, UA: NEGATIVE
Ketones, UA: NEGATIVE
Nitrite, UA: NEGATIVE
POC,PROTEIN,UA: NEGATIVE

## 2022-08-18 MED ORDER — BLOOD PRESSURE MONITOR MISC: 0 refills | Status: DC

## 2022-08-18 MED ORDER — ASPIRIN 81 MG PO TBEC: 162.0000 mg | DELAYED_RELEASE_TABLET | Freq: Every day | ORAL | 2 refills | Status: DC

## 2022-08-18 NOTE — Progress Notes (Signed)
INITIAL OBSTETRICAL VISIT Patient name: Carla Rose MRN [id number]  Date of birth: [birth year] Chief Complaint:   Initial Prenatal Visit  History of Present Illness:   Carla Rose is a 27 y.o. [phone] Caucasian female at [redacted]w[redacted]d by LMP c/w u/s at 6 weeks with an Estimated Date of Delivery: 03/04/23 being seen today for her initial obstetrical visit.  (Initially here for thinking had UTI, but states she just said that b/c she wanted an appt to make sure everything was ok w/ baby d/t having ectopic in July, new ob appt not scheduled til 2/26, so switched to today). Does not think she has UTI. Some brownish d/c and occ pelvic pain. Denies abnormal discharge, itching/odor/irritation.  Mild nausea, no vomiting. Declines meds.  Patient's last menstrual period was 05/28/2022 (approximate). Her obstetrical history is significant for  2015: term SVB, 2018: term SVB w/ GHTN, 2019: term SVB w/ PPHTN, July 2023 ectopic w/ Lt salpingectomy .   Last pap 10/28/21. Results were: ASCUS w/ HRHPV negative     08/18/2022    4:12 PM 10/28/2021   11:09 AM 10/10/2021    2:13 PM 02/25/2018    4:11 PM  Depression screen PHQ 2/9  Decreased Interest 3 0 0 0  Down, Depressed, Hopeless 2 0 0 0  PHQ - 2 Score 5 0 0 0  Altered sleeping 2 0 0 0  Tired, decreased energy 3 0 1 0  Change in appetite 1 0 0 0  Feeling bad or failure about yourself  3 0 1 0  Trouble concentrating 0 0 0 0  Moving slowly or fidgety/restless 0 0 0 0  Suicidal thoughts 0 0 0 0  PHQ-9 Score 14 0 2 0        08/18/2022    4:12 PM 10/28/2021   11:10 AM 10/10/2021    2:13 PM  GAD 7 : Generalized Anxiety Score  Nervous, Anxious, on Edge 2 0 0  Control/stop worrying 2 0 0  Worry too much - different things 2 0 0  Trouble relaxing 2 0 0  Restless 0 0 0  Easily annoyed or irritable 0 0 0  Afraid - awful might happen 0 0 0  Total GAD 7 Score 8 0 0     Review of Systems:   Pertinent items are noted in HPI Denies cramping/contractions,  leakage of fluid, vaginal bleeding, abnormal vaginal discharge w/ itching/odor/irritation, headaches, visual changes, shortness of breath, chest pain, abdominal pain, severe nausea/vomiting, or problems with urination or bowel movements unless otherwise stated above.  Pertinent History Reviewed:  Reviewed past medical,surgical, social, obstetrical and family history.  Reviewed problem list, medications and allergies. OB History  Gravida Para Term Preterm AB Living  5 3 3   1 3   SAB IAB Ectopic Multiple Live Births      1 0 3    # Outcome Date GA Lbr Len/2nd Weight Sex Delivery Anes PTL Lv  5 Current           4 Ectopic 01/30/22             Birth Comments: Lt w/ Lt salpingectomy  3 Term 07/19/18 [redacted]w[redacted]d 07:01 / 00:09 8 lb 6.6 oz (3.815 kg) M Vag-Spont EPI  LIV     Birth Comments: PP HTN  2 Term 01/09/17 [redacted]w[redacted]d  7 lb 4 oz (3.289 kg) F Vag-Spont EPI N LIV     Complications: Gestational hypertension  1 Term 01/17/14 [redacted]w[redacted]d  7 lb 3 oz (  3.26 kg) M Vag-Spont EPI N LIV   Physical Assessment:   Vitals:   08/18/22 1523  BP: 126/80  Pulse: 96  Weight: 189 lb 6.4 oz (85.9 kg)  Height: 5\' 8"  (1.727 m)  Body mass index is 28.8 kg/m.       Physical Examination:  General appearance - well appearing, and in no distress  Mental status - alert, oriented to person, place, and time  Psych:  She has a normal mood and affect  Skin - warm and dry, normal color, no suspicious lesions noted  Chest - effort normal, all lung fields clear to auscultation bilaterally  Heart - normal rate and regular rhythm  Abdomen - soft, nontender  Extremities:  No swelling or varicosities noted  Thin prep pap is not done   Chaperone: N/A    TODAY'S FHR: informal TA u/s, +FCA and active fetus  Results for orders placed or performed in visit on 08/18/22 (from the past 24 hour(s))  POC Urinalysis Dipstick OB   Collection Time: 08/18/22  3:30 PM  Result Value Ref Range   Color, UA     Clarity, UA     Glucose, UA  Negative Negative   Bilirubin, UA     Ketones, UA neg    Spec Grav, UA     Blood, UA neg    pH, UA     POC,PROTEIN,UA Negative Negative, Trace, Small (1+), Moderate (2+), Large (3+), 4+   Urobilinogen, UA     Nitrite, UA neg    Leukocytes, UA Small (1+) (A) Negative   Appearance     Odor      Assessment & Plan:  1) Low-Risk Pregnancy [id number] at [redacted]w[redacted]d with an Estimated Date of Delivery: 03/04/23   2) Initial OB visit  3) H/O GHTN and PPHTN> ASA, baseline labs  4) H/O ectopic w/ Lt salpingectomy  Meds:  Meds ordered this encounter  Medications   aspirin EC 81 MG tablet    Sig: Take 2 tablets (162 mg total) by mouth daily. Swallow whole.    Dispense:  180 tablet    Refill:  2   Blood Pressure Monitor MISC    Sig: For regular home bp monitoring during pregnancy    Dispense:  1 each    Refill:  0    Dx: z34.90    Initial labs obtained Continue prenatal vitamins Reviewed n/v relief measures and warning s/s to report Reviewed recommended weight gain based on pre-gravid BMI Encouraged well-balanced diet Genetic & carrier screening discussed: declines Panorama, NT/IT, AFP, and Horizon  Ultrasound discussed; fetal survey: requested CCNC completed> form faxed if has or is planning to apply for medicaid The nature of [employer] for [employer] with multiple MDs and other Advanced Practice Providers was explained to patient; also emphasized that fellows, residents, and students are part of our team. Does not have home bp cuff. Office bp cuff given: no. Rx sent: yes. Check bp weekly, let us know if consistently >140/90.   Follow-up: Return in about 4 weeks (around 09/15/2022) for LROB, CNM, in person; then 8wks from now for anatomy u/s and LROB w/ CNM (cancel 2/26 appt).   Orders Placed This Encounter  Procedures   Urine Culture   GC/Chlamydia Probe Amp   CBC/D/Plt+RPR+Rh+ABO+RubIgG...   Protein / creatinine ratio, urine   Comp Met (CMET)   POC Urinalysis  Dipstick OB    Roma Schanz CNM, [facility] 08/18/2022 4:59 PM

## 2022-08-18 NOTE — Patient Instructions (Signed)
Carla Rose, thank you for choosing our office today! We appreciate the opportunity to meet your healthcare needs. You may receive a short survey by mail, e-mail, or through [employer]. If you are happy with your care we would appreciate if you could take just a few minutes to complete the survey questions. We read all of your comments and take your feedback very seriously. Thank you again for choosing our office.  [facility] at [facility] ([place], [place] [id number]) Entrance C, located off of [address] parking   Nausea & Vomiting Have saltine crackers or pretzels by your bed and eat a few bites before you raise your head out of bed in the morning Eat small frequent meals throughout the day instead of large meals Drink plenty of fluids throughout the day to stay hydrated, just don't drink a lot of fluids with your meals.  This can make your stomach fill up faster making you feel sick Do not brush your teeth right after you eat Products with real ginger are good for nausea, like ginger ale and ginger hard candy Make sure it says made with real ginger! Sucking on sour candy like lemon heads is also good for nausea If your prenatal vitamins make you nauseated, take them at night so you will sleep through the nausea Sea Bands If you feel like you need medicine for the nausea & vomiting please let us know If you are unable to keep any fluids or food down please let us know   Constipation Drink plenty of fluid, preferably water, throughout the day Eat foods high in fiber such as fruits, vegetables, and grains Exercise, such as walking, is a good way to keep your bowels regular Drink warm fluids, especially warm prune juice, or decaf coffee Eat a 1/2 cup of real oatmeal (not instant), 1/2 cup applesauce, and 1/2-1 cup warm prune juice every day If needed, you may take Colace (docusate sodium) stool softener  once or twice a day to help keep the stool soft.  If you still are having problems with constipation, you may take Miralax once daily as needed to help keep your bowels regular.   Home Blood Pressure Monitoring for Patients   Your provider has recommended that you check your blood pressure (BP) at least once a week at home. If you do not have a blood pressure cuff at home, one will be provided for you. Contact your provider if you have not received your monitor within 1 week.   Helpful Tips for Accurate Home Blood Pressure Checks  Don't smoke, exercise, or drink caffeine 30 minutes before checking your BP Use the restroom before checking your BP (a full bladder can raise your pressure) Relax in a comfortable upright chair Feet on the ground Left arm resting comfortably on a flat surface at the level of your heart Legs uncrossed Back supported Sit quietly and don't talk Place the cuff on your bare arm Adjust snuggly, so that only two fingertips can fit between your skin and the top of the cuff Check 2 readings separated by at least one minute Keep a log of your BP readings For a visual, please reference this diagram: http://ccnc.care/bpdiagram  Provider Name: [facility]     Phone: [phone]  Zone 1: ALL CLEAR  Continue to monitor your symptoms:  BP reading is less than 140 (top number) or less than 90 (bottom  number)  No right upper stomach pain No headaches or seeing spots No feeling nauseated or throwing up No swelling in face and hands  Zone 2: CAUTION Call your doctor's office for any of the following:  BP reading is greater than 140 (top number) or greater than 90 (bottom number)  Stomach pain under your ribs in the middle or right side Headaches or seeing spots Feeling nauseated or throwing up Swelling in face and hands  Zone 3: EMERGENCY  Seek immediate medical care if you have any of the following:  BP reading is greater than160 (top number) or greater than  110 (bottom number) Severe headaches not improving with Tylenol Serious difficulty catching your breath Any worsening symptoms from Zone 2    First Trimester of Pregnancy The first trimester of pregnancy is from week 1 until the end of week 12 (months 1 through 3). A week after a sperm fertilizes an egg, the egg will implant on the wall of the uterus. This embryo will begin to develop into a baby. Genes from you and your partner are forming the baby. The female genes determine whether the baby is a boy or a girl. At 6-8 weeks, the eyes and face are formed, and the heartbeat can be seen on ultrasound. At the end of 12 weeks, all the baby's organs are formed.  Now that you are pregnant, you will want to do everything you can to have a healthy baby. Two of the most important things are to get good prenatal care and to follow your health care provider's instructions. Prenatal care is all the medical care you receive before the baby's birth. This care will help prevent, find, and treat any problems during the pregnancy and childbirth. BODY CHANGES Your body goes through many changes during pregnancy. The changes vary from woman to woman.  You may gain or lose a couple of pounds at first. You may feel sick to your stomach (nauseous) and throw up (vomit). If the vomiting is uncontrollable, call your health care provider. You may tire easily. You may develop headaches that can be relieved by medicines approved by your health care provider. You may urinate more often. Painful urination may mean you have a bladder infection. You may develop heartburn as a result of your pregnancy. You may develop constipation because certain hormones are causing the muscles that push waste through your intestines to slow down. You may develop hemorrhoids or swollen, bulging veins (varicose veins). Your breasts may begin to grow larger and become tender. Your nipples may stick out more, and the tissue that surrounds them  ([place]) may become darker. Your gums may bleed and may be sensitive to brushing and flossing. Dark spots or blotches (chloasma, mask of pregnancy) may develop on your face. This will likely fade after the baby is born. Your menstrual periods will stop. You may have a loss of appetite. You may develop cravings for certain kinds of food. You may have changes in your emotions from day to day, such as being excited to be pregnant or being concerned that something may go wrong with the pregnancy and baby. You may have more vivid and strange dreams. You may have changes in your hair. These can include thickening of your hair, rapid growth, and changes in texture. Some women also have hair loss during or after pregnancy, or hair that feels dry or thin. Your hair will most likely return to normal after your baby is born. WHAT TO EXPECT AT YOUR PRENATAL  VISITS During a routine prenatal visit: You will be weighed to make sure you and the baby are growing normally. Your blood pressure will be taken. Your abdomen will be measured to track your baby's growth. The fetal heartbeat will be listened to starting around week 10 or 12 of your pregnancy. Test results from any previous visits will be discussed. Your health care provider may ask you: How you are feeling. If you are feeling the baby move. If you have had any abnormal symptoms, such as leaking fluid, bleeding, severe headaches, or abdominal cramping. If you have any questions. Other tests that may be performed during your first trimester include: Blood tests to find your blood type and to check for the presence of any previous infections. They will also be used to check for low iron levels (anemia) and Rh antibodies. Later in the pregnancy, blood tests for diabetes will be done along with other tests if problems develop. Urine tests to check for infections, diabetes, or protein in the urine. An ultrasound to confirm the proper growth and development  of the baby. An amniocentesis to check for possible genetic problems. Fetal screens for spina bifida and Down syndrome. You may need other tests to make sure you and the baby are doing well. HOME CARE INSTRUCTIONS  Medicines Follow your health care provider's instructions regarding medicine use. Specific medicines may be either safe or unsafe to take during pregnancy. Take your prenatal vitamins as directed. If you develop constipation, try taking a stool softener if your health care provider approves. Diet Eat regular, well-balanced meals. Choose a variety of foods, such as meat or vegetable-based protein, fish, milk and low-fat dairy products, vegetables, fruits, and whole grain breads and cereals. Your health care provider will help you determine the amount of weight gain that is right for you. Avoid raw meat and uncooked cheese. These carry germs that can cause birth defects in the baby. Eating four or five small meals rather than three large meals a day may help relieve nausea and vomiting. If you start to feel nauseous, eating a few soda crackers can be helpful. Drinking liquids between meals instead of during meals also seems to help nausea and vomiting. If you develop constipation, eat more high-fiber foods, such as fresh vegetables or fruit and whole grains. Drink enough fluids to keep your urine clear or pale yellow. Activity and Exercise Exercise only as directed by your health care provider. Exercising will help you: Control your weight. Stay in shape. Be prepared for labor and delivery. Experiencing pain or cramping in the lower abdomen or low back is a good sign that you should stop exercising. Check with your health care provider before continuing normal exercises. Try to avoid standing for long periods of time. Move your legs often if you must stand in one place for a long time. Avoid heavy lifting. Wear low-heeled shoes, and practice good posture. You may continue to have sex  unless your health care provider directs you otherwise. Relief of Pain or Discomfort Wear a good support bra for breast tenderness.   Take warm sitz baths to soothe any pain or discomfort caused by hemorrhoids. Use hemorrhoid cream if your health care provider approves.   Rest with your legs elevated if you have leg cramps or low back pain. If you develop varicose veins in your legs, wear support hose. Elevate your feet for 15 minutes, 3-4 times a day. Limit salt in your diet. Prenatal Care Schedule your prenatal visits by the  twelfth week of pregnancy. They are usually scheduled monthly at first, then more often in the last 2 months before delivery. Write down your questions. Take them to your prenatal visits. Keep all your prenatal visits as directed by your health care provider. Safety Wear your seat belt at all times when driving. Make a list of emergency phone numbers, including numbers for family, friends, the hospital, and police and fire departments. General Tips Ask your health care provider for a referral to a local prenatal education class. Begin classes no later than at the beginning of month 6 of your pregnancy. Ask for help if you have counseling or nutritional needs during pregnancy. Your health care provider can offer advice or refer you to specialists for help with various needs. Do not use hot tubs, steam rooms, or saunas. Do not douche or use tampons or scented sanitary pads. Do not cross your legs for long periods of time. Avoid cat litter boxes and soil used by cats. These carry germs that can cause birth defects in the baby and possibly loss of the fetus by miscarriage or stillbirth. Avoid all smoking, herbs, alcohol, and medicines not prescribed by your health care provider. Chemicals in these affect the formation and growth of the baby. Schedule a dentist appointment. At home, brush your teeth with a soft toothbrush and be gentle when you floss. SEEK MEDICAL CARE IF:   You have dizziness. You have mild pelvic cramps, pelvic pressure, or nagging pain in the abdominal area. You have persistent nausea, vomiting, or diarrhea. You have a bad smelling vaginal discharge. You have pain with urination. You notice increased swelling in your face, hands, legs, or ankles. SEEK IMMEDIATE MEDICAL CARE IF:  You have a fever. You are leaking fluid from your vagina. You have spotting or bleeding from your vagina. You have severe abdominal cramping or pain. You have rapid weight gain or loss. You vomit blood or material that looks like coffee grounds. You are exposed to [place] measles and have never had them. You are exposed to fifth disease or chickenpox. You develop a severe headache. You have shortness of breath. You have any kind of trauma, such as from a fall or a car accident. Document Released: 07/08/2001 Document Revised: 11/28/2013 Document Reviewed: 05/24/2013 [facility] Patient Information 2015 [place], [place]. This information is not intended to replace advice given to you by your health care provider. Make sure you discuss any questions you have with your health care provider.

## 2022-08-20 LAB — CBC/D/PLT+RPR+RH+ABO+RUBIGG...
Antibody Screen: NEGATIVE
Basophils Absolute: 0 10*3/uL (ref 0.0–0.2)
Basos: 0 %
EOS (ABSOLUTE): 0.1 10*3/uL (ref 0.0–0.4)
Eos: 1 %
HCV Ab: NONREACTIVE
HIV Screen 4th Generation wRfx: NONREACTIVE
Hematocrit: 33.4 % — ABNORMAL LOW (ref 34.0–46.6)
Hemoglobin: 11 g/dL — ABNORMAL LOW (ref 11.1–15.9)
Hepatitis B Surface Ag: NEGATIVE
Immature Grans (Abs): 0 10*3/uL (ref 0.0–0.1)
Immature Granulocytes: 0 %
Lymphocytes Absolute: 1.6 10*3/uL (ref 0.7–3.1)
Lymphs: 23 %
MCH: 27.6 pg (ref 26.6–33.0)
MCHC: 32.9 g/dL (ref 31.5–35.7)
MCV: 84 fL (ref 79–97)
Monocytes Absolute: 0.5 10*3/uL (ref 0.1–0.9)
Monocytes: 7 %
Neutrophils Absolute: 4.8 10*3/uL (ref 1.4–7.0)
Neutrophils: 69 %
Platelets: 264 10*3/uL (ref 150–450)
RBC: 3.98 x10E6/uL (ref 3.77–5.28)
RDW: 14.8 % (ref 11.7–15.4)
RPR Ser Ql: NONREACTIVE
Rh Factor: POSITIVE
Rubella Antibodies, IGG: 1.63 index (ref >0.99)
WBC: 6.9 10*3/uL (ref 3.4–10.8)

## 2022-08-20 LAB — COMPREHENSIVE METABOLIC PANEL
ALT: 19 IU/L (ref 0–32)
AST: 15 IU/L (ref 0–40)
Albumin/Globulin Ratio: 1.5 (ref 1.2–2.2)
Albumin: 3.9 g/dL — ABNORMAL LOW (ref 4.0–5.0)
Alkaline Phosphatase: 120 IU/L (ref 44–121)
BUN/Creatinine Ratio: 13 (ref 9–23)
BUN: 7 mg/dL (ref 6–20)
Bilirubin Total: <0.2 mg/dL (ref 0.0–1.2)
CO2: 18 mmol/L — ABNORMAL LOW (ref 20–29)
Calcium: 9.1 mg/dL (ref 8.7–10.2)
Chloride: 102 mmol/L (ref 96–106)
Creatinine, Ser: 0.55 mg/dL — ABNORMAL LOW (ref 0.57–1.00)
Globulin, Total: 2.6 g/dL (ref 1.5–4.5)
Glucose: 79 mg/dL (ref 70–99)
Potassium: 3.8 mmol/L (ref 3.5–5.2)
Sodium: 137 mmol/L (ref 134–144)
Total Protein: 6.5 g/dL (ref 6.0–8.5)
eGFR: 130 mL/min/{1.73_m2} (ref >59)

## 2022-08-20 LAB — HCV INTERPRETATION

## 2022-08-20 LAB — PROTEIN / CREATININE RATIO, URINE
Creatinine, Urine: 189.6 mg/dL
Protein, Ur: 26.2 mg/dL
Protein/Creat Ratio: 138 mg/g creat (ref 0–200)

## 2022-08-21 LAB — URINE CULTURE

## 2022-08-21 LAB — GC/CHLAMYDIA PROBE AMP
Chlamydia trachomatis, NAA: NEGATIVE
Neisseria Gonorrhoeae by PCR: NEGATIVE

## 2022-09-15 ENCOUNTER — Ambulatory Visit (INDEPENDENT_AMBULATORY_CARE_PROVIDER_SITE_OTHER): Payer: Medicaid Other | Admitting: Advanced Practice Midwife

## 2022-09-15 VITALS — BP 118/77 | HR 89 | Wt 193.0 lb

## 2022-09-15 DIAGNOSIS — Z3482 Encounter for supervision of other normal pregnancy, second trimester: Secondary | ICD-10-CM

## 2022-09-15 DIAGNOSIS — Z3A15 15 weeks gestation of pregnancy: Secondary | ICD-10-CM

## 2022-09-15 MED ORDER — DOXYLAMINE-PYRIDOXINE 10-10 MG PO TBEC: DELAYED_RELEASE_TABLET | ORAL | 6 refills | Status: DC

## 2022-09-15 MED ORDER — ONDANSETRON 4 MG PO TBDP: 4.0000 mg | ORAL_TABLET | Freq: Four times a day (QID) | ORAL | 2 refills | Status: DC | PRN

## 2022-09-15 NOTE — Patient Instructions (Signed)
Carla Rose, I greatly value your feedback.  If you receive a survey following your visit with [place] today, we appreciate you taking the time to fill it out.  Thanks, Nigel Berthold, CNM     [place]!!! It is now [place] at [facility] ([place], [place] [address]) Entrance located off of [address] parking   Go to [email or web address] to register for FREE online childbirth classes    Second Trimester of Pregnancy The second trimester is from week 14 through week 27 (months 4 through 6). The second trimester is often a time when you feel your best. Your body has adjusted to being pregnant, and you begin to feel better physically. Usually, morning sickness has lessened or quit completely, you may have more energy, and you may have an increase in appetite. The second trimester is also a time when the fetus is growing rapidly. At the end of the sixth month, the fetus is about 9 inches long and weighs about 1 pounds. You will likely begin to feel the baby move (quickening) between 16 and 20 weeks of pregnancy. Body changes during your second trimester Your body continues to go through many changes during your second trimester. The changes vary from woman to woman. Your weight will continue to increase. You will notice your lower abdomen bulging out. You may begin to get stretch marks on your hips, abdomen, and breasts. You may develop headaches that can be relieved by medicines. The medicines should be approved by your health care provider. You may urinate more often because the fetus is pressing on your bladder. You may develop or continue to have heartburn as a result of your pregnancy. You may develop constipation because certain hormones are causing the muscles that push waste through your intestines to slow down. You may develop hemorrhoids or swollen, bulging veins (varicose veins). You may have back pain. This is  caused by: Weight gain. Pregnancy hormones that are relaxing the joints in your pelvis. A shift in weight and the muscles that support your balance. Your breasts will continue to grow and they will continue to become tender. Your gums may bleed and may be sensitive to brushing and flossing. Dark spots or blotches (chloasma, mask of pregnancy) may develop on your face. This will likely fade after the baby is born. A dark line from your belly button to the pubic area (linea nigra) may appear. This will likely fade after the baby is born. You may have changes in your hair. These can include thickening of your hair, rapid growth, and changes in texture. Some women also have hair loss during or after pregnancy, or hair that feels dry or thin. Your hair will most likely return to normal after your baby is born.  What to expect at prenatal visits During a routine prenatal visit: You will be weighed to make sure you and the fetus are growing normally. Your blood pressure will be taken. Your abdomen will be measured to track your baby's growth. The fetal heartbeat will be listened to. Any test results from the previous visit will be discussed.  Your health care provider may ask you: How you are feeling. If you are feeling the baby move. If you have had any abnormal symptoms, such as leaking fluid, bleeding, severe headaches, or abdominal cramping. If you are using any tobacco products, including cigarettes, chewing tobacco, and electronic cigarettes. If you have any questions.  Other tests that  may be performed during your second trimester include: Blood tests that check for: Low iron levels (anemia). High blood sugar that affects pregnant women (gestational diabetes) between 79 and 28 weeks. Rh antibodies. This is to check for a protein on red blood cells (Rh factor). Urine tests to check for infections, diabetes, or protein in the urine. An ultrasound to confirm the proper growth and  development of the baby. An amniocentesis to check for possible genetic problems. Fetal screens for spina bifida and Down syndrome. HIV (human immunodeficiency virus) testing. Routine prenatal testing includes screening for HIV, unless you choose not to have this test.  Follow these instructions at home: Medicines Follow your health care provider's instructions regarding medicine use. Specific medicines may be either safe or unsafe to take during pregnancy. Take a prenatal vitamin that contains at least 600 micrograms (mcg) of folic acid. If you develop constipation, try taking a stool softener if your health care provider approves. Eating and drinking Eat a balanced diet that includes fresh fruits and vegetables, whole grains, good sources of protein such as meat, eggs, or tofu, and low-fat dairy. Your health care provider will help you determine the amount of weight gain that is right for you. Avoid raw meat and uncooked cheese. These carry germs that can cause birth defects in the baby. If you have low calcium intake from food, talk to your health care provider about whether you should take a daily calcium supplement. Limit foods that are high in fat and processed sugars, such as fried and sweet foods. To prevent constipation: Drink enough fluid to keep your urine clear or pale yellow. Eat foods that are high in fiber, such as fresh fruits and vegetables, whole grains, and beans. Activity Exercise only as directed by your health care provider. Most women can continue their usual exercise routine during pregnancy. Try to exercise for 30 minutes at least 5 days a week. Stop exercising if you experience uterine contractions. Avoid heavy lifting, wear low heel shoes, and practice good posture. A sexual relationship may be continued unless your health care provider directs you otherwise. Relieving pain and discomfort Wear a good support bra to prevent discomfort from breast tenderness. Take  warm sitz baths to soothe any pain or discomfort caused by hemorrhoids. Use hemorrhoid cream if your health care provider approves. Rest with your legs elevated if you have leg cramps or low back pain. If you develop varicose veins, wear support hose. Elevate your feet for 15 minutes, 3-4 times a day. Limit salt in your diet. Prenatal Care Write down your questions. Take them to your prenatal visits. Keep all your prenatal visits as told by your health care provider. This is important. Safety Wear your seat belt at all times when driving. Make a list of emergency phone numbers, including numbers for family, friends, the hospital, and police and fire departments. General instructions Ask your health care provider for a referral to a local prenatal education class. Begin classes no later than the beginning of month 6 of your pregnancy. Ask for help if you have counseling or nutritional needs during pregnancy. Your health care provider can offer advice or refer you to specialists for help with various needs. Do not use hot tubs, steam rooms, or saunas. Do not douche or use tampons or scented sanitary pads. Do not cross your legs for long periods of time. Avoid cat litter boxes and soil used by cats. These carry germs that can cause birth defects in the baby and  possibly loss of the fetus by miscarriage or stillbirth. Avoid all smoking, herbs, alcohol, and unprescribed drugs. Chemicals in these products can affect the formation and growth of the baby. Do not use any products that contain nicotine or tobacco, such as cigarettes and e-cigarettes. If you need help quitting, ask your health care provider. Visit your dentist if you have not gone yet during your pregnancy. Use a soft toothbrush to brush your teeth and be gentle when you floss. Contact a health care provider if: You have dizziness. You have mild pelvic cramps, pelvic pressure, or nagging pain in the abdominal area. You have persistent  nausea, vomiting, or diarrhea. You have a bad smelling vaginal discharge. You have pain when you urinate. Get help right away if: You have a fever. You are leaking fluid from your vagina. You have spotting or bleeding from your vagina. You have severe abdominal cramping or pain. You have rapid weight gain or weight loss. You have shortness of breath with chest pain. You notice sudden or extreme swelling of your face, hands, ankles, feet, or legs. You have not felt your baby move in over an hour. You have severe headaches that do not go away when you take medicine. You have vision changes. Summary The second trimester is from week 14 through week 27 (months 4 through 6). It is also a time when the fetus is growing rapidly. Your body goes through many changes during pregnancy. The changes vary from woman to woman. Avoid all smoking, herbs, alcohol, and unprescribed drugs. These chemicals affect the formation and growth your baby. Do not use any tobacco products, such as cigarettes, chewing tobacco, and e-cigarettes. If you need help quitting, ask your health care provider. Contact your health care provider if you have any questions. Keep all prenatal visits as told by your health care provider. This is important. This information is not intended to replace advice given to you by your health care provider. Make sure you discuss any questions you have with your health care provider.

## 2022-09-15 NOTE — Progress Notes (Signed)
   LOW-RISK PREGNANCY VISIT Patient name: Carla Rose MRN [id number]  Date of birth: 01/28/1996 Chief Complaint:   Routine Prenatal Visit  History of Present Illness:   Carla Rose is a 27 y.o. [id number] female at 92w5dwith an Estimated Date of Delivery: 03/04/23 being seen today for ongoing management of a low-risk pregnancy.  Today she reports hands turn blue at work.  Some tingling. Reassured of normalcy. Contractions: Not present. Vag. Bleeding: None.  Movement: Absent. denies leaking of fluid. Review of Systems:   Pertinent items are noted in HPI Denies abnormal vaginal discharge w/ itching/odor/irritation, headaches, visual changes, shortness of breath, chest pain, abdominal pain, severe nausea/vomiting, or problems with urination or bowel movements unless otherwise stated above. Pertinent History Reviewed:  Reviewed past medical,surgical, social, obstetrical and family history.  Reviewed problem list, medications and allergies. Physical Assessment:   Vitals:   09/15/22 1337  BP: 118/77  Pulse: 89  Weight: 193 lb (87.5 kg)  Body mass index is 29.35 kg/m.        Physical Examination:   General appearance: Well appearing, and in no distress  Mental status: Alert, oriented to person, place, and time  Skin: Warm & dry  Cardiovascular: Normal heart rate noted  Respiratory: Normal respiratory effort, no distress  Abdomen: Soft, gravid, nontender  Pelvic: Cervical exam deferred         Extremities:    Fetal Status: Fetal Heart Rate (bpm): 152   Movement: Absent    Chaperone:  N/A    No results found for this or any previous visit (from the past 24 hour(s)).  Assessment & Plan:    Pregnancy: [id number] 133w5d. Encounter for supervision of other normal pregnancy in second trimester      Meds:  Meds ordered this encounter  Medications   ondansetron (ZOFRAN-ODT) 4 MG disintegrating tablet    Sig: Take 1 tablet (4 mg total) by mouth every 6 (six) hours as needed  for nausea.    Dispense:  30 tablet    Refill:  2    Order Specific Question:   Supervising Provider    Answer:   EUTania Ade [[id number]]   Doxylamine-Pyridoxine (DICLEGIS) 10-10 MG TBEC    Sig: Take 2 qhs; may also take one in am and one in afternoon prn nausea    Dispense:  120 tablet    Refill:  6    Order Specific Question:   Supervising Provider    Answer:   EUFlorian [id number]]   Labs/procedures today: none  Plan:  Continue routine obstetrical care  Next visit: prefers will be in person for anatomy scan     Reviewed: general obstetric precautions including but not limited to vaginal bleeding, contractions, leaking of fluid and fetal movement were reviewed in detail with the patient.  All questions were answered.  Follow-up: Return for As scheduled.  Future Appointments  Date Time Provider [place]3/18/2024  1:30 PM [facility] [place]-FTIMG None  10/13/2022  2:30 PM BoRoma SchanzCNM [facility]TO[facility]    No orders of the defined types were placed in this encounter.  FrChristin FudgeNP, CNM 09/15/2022 2:04 PM

## 2022-09-22 ENCOUNTER — Encounter: Payer: Self-pay | Admitting: Women's Health

## 2022-09-22 ENCOUNTER — Encounter: Payer: Self-pay | Admitting: *Deleted

## 2022-10-10 ENCOUNTER — Other Ambulatory Visit: Payer: Self-pay | Admitting: Advanced Practice Midwife

## 2022-10-10 DIAGNOSIS — Z363 Encounter for antenatal screening for malformations: Secondary | ICD-10-CM

## 2022-10-13 ENCOUNTER — Ambulatory Visit (INDEPENDENT_AMBULATORY_CARE_PROVIDER_SITE_OTHER): Payer: Medicaid Other | Admitting: Women's Health

## 2022-10-13 ENCOUNTER — Ambulatory Visit (INDEPENDENT_AMBULATORY_CARE_PROVIDER_SITE_OTHER): Payer: Medicaid Other

## 2022-10-13 ENCOUNTER — Encounter: Payer: Self-pay | Admitting: Women's Health

## 2022-10-13 VITALS — BP 126/84 | HR 84 | Wt 196.8 lb

## 2022-10-13 DIAGNOSIS — Z3482 Encounter for supervision of other normal pregnancy, second trimester: Secondary | ICD-10-CM

## 2022-10-13 DIAGNOSIS — Z3A19 19 weeks gestation of pregnancy: Secondary | ICD-10-CM

## 2022-10-13 DIAGNOSIS — Z363 Encounter for antenatal screening for malformations: Secondary | ICD-10-CM | POA: Diagnosis not present

## 2022-10-13 DIAGNOSIS — R300 Dysuria: Secondary | ICD-10-CM

## 2022-10-13 DIAGNOSIS — Z3A20 20 weeks gestation of pregnancy: Secondary | ICD-10-CM | POA: Diagnosis not present

## 2022-10-13 LAB — POCT URINALYSIS DIPSTICK OB
Glucose, UA: NEGATIVE
Ketones, UA: NEGATIVE
Nitrite, UA: NEGATIVE
POC,PROTEIN,UA: NEGATIVE

## 2022-10-13 NOTE — Progress Notes (Signed)
LOW-RISK PREGNANCY VISIT Patient name: Carla Rose MRN [id number]  Date of birth: Nov 20, 1995 Chief Complaint:   Routine Prenatal Visit  History of Present Illness:   Carla Rose is a 26 y.o. [id number] female at [redacted]w[redacted]d with an Estimated Date of Delivery: 03/04/23 being seen today for ongoing management of a low-risk pregnancy.   Today she reports  Rt round ligament pain when working . Contractions: Not present. Vag. Bleeding: None.  Movement: Present. denies leaking of fluid.     08/18/2022    4:12 PM 10/28/2021   11:09 AM 10/10/2021    2:13 PM 02/25/2018    4:11 PM  Depression screen PHQ 2/9  Decreased Interest 3 0 0 0  Down, Depressed, Hopeless 2 0 0 0  PHQ - 2 Score 5 0 0 0  Altered sleeping 2 0 0 0  Tired, decreased energy 3 0 1 0  Change in appetite 1 0 0 0  Feeling bad or failure about yourself  3 0 1 0  Trouble concentrating 0 0 0 0  Moving slowly or fidgety/restless 0 0 0 0  Suicidal thoughts 0 0 0 0  PHQ-9 Score 14 0 2 0        08/18/2022    4:12 PM 10/28/2021   11:10 AM 10/10/2021    2:13 PM  GAD 7 : Generalized Anxiety Score  Nervous, Anxious, on Edge 2 0 0  Control/stop worrying 2 0 0  Worry too much - different things 2 0 0  Trouble relaxing 2 0 0  Restless 0 0 0  Easily annoyed or irritable 0 0 0  Afraid - awful might happen 0 0 0  Total GAD 7 Score 8 0 0      Review of Systems:   Pertinent items are noted in HPI Denies abnormal vaginal discharge w/ itching/odor/irritation, headaches, visual changes, shortness of breath, chest pain, abdominal pain, severe nausea/vomiting, or problems with urination or bowel movements unless otherwise stated above. Pertinent History Reviewed:  Reviewed past medical,surgical, social, obstetrical and family history.  Reviewed problem list, medications and allergies. Physical Assessment:   Vitals:   10/13/22 1410  BP: 126/84  Pulse: 84  Weight: 196 lb 12.8 oz (89.3 kg)  Body mass index is 29.92 kg/m.         Physical Examination:   General appearance: Well appearing, and in no distress  Mental status: Alert, oriented to person, place, and time  Skin: Warm & dry  Cardiovascular: Normal heart rate noted  Respiratory: Normal respiratory effort, no distress  Abdomen: Soft, gravid, nontender  Pelvic: Cervical exam deferred         Extremities: Edema: None  Fetal Status: Fetal Heart Rate (bpm): 132 u/s   Movement: Present  [place] [id number] wks,cephalic,posterior placenta gr 0,normal ovaries,FHR 132 bpm,SVP of fluid 5.2 cm,cx 4.2 cm,EFW 347 g 78%,anatomy complete,no obvious abnormalities   Chaperone: N/A   Results for orders placed or performed in visit on 10/13/22 (from the past 24 hour(s))  POC Urinalysis Dipstick OB   Collection Time: 10/13/22  2:16 PM  Result Value Ref Range   Color, UA     Clarity, UA     Glucose, UA Negative Negative   Bilirubin, UA     Ketones, UA neg    Spec Grav, UA     Blood, UA trace    pH, UA     POC,PROTEIN,UA Negative Negative, Trace, Small (1+), Moderate (2+), Large (3+), 4+   Urobilinogen, UA  Nitrite, UA neg    Leukocytes, UA Small (1+) (A) Negative   Appearance     Odor      Assessment & Plan:  1) Low-risk pregnancy [id number] at [redacted]w[redacted]d with an Estimated Date of Delivery: 03/04/23   2) Rt round ligament pain, can try Ktape/belt to help support when at work  3) H/O GHTN & PPHTN> ASA   Meds: No orders of the defined types were placed in this encounter.  Labs/procedures today: U/S  Plan:  Continue routine obstetrical care  Next visit: prefers in person    Reviewed: Preterm labor symptoms and general obstetric precautions including but not limited to vaginal bleeding, contractions, leaking of fluid and fetal movement were reviewed in detail with the patient.  All questions were answered. Does have home bp cuff. Office bp cuff given: not applicable. Check bp weekly, let us know if consistently >140 and/or >90.  Follow-up: Return in about 4 weeks (around  11/10/2022) for [place], [place], in person.  Future Appointments  Date Time Provider [place]  11/10/2022  1:50 PM Roma Schanz, CNM [facility]    Orders Placed This Encounter  Procedures   POC Urinalysis Dipstick OB   [place], [facility] 10/13/2022 2:31 PM

## 2022-10-13 NOTE — Progress Notes (Signed)
[place] [id number] wks,cephalic,posterior placenta gr 0,normal ovaries,FHR 132 bpm,SVP of fluid 5.2 cm,cx 4.2 cm,EFW 347 g 78%,anatomy complete,no obvious abnormalities

## 2022-10-13 NOTE — Patient Instructions (Signed)
Carla Rose, thank you for choosing our office today! We appreciate the opportunity to meet your healthcare needs. You may receive a short survey by mail, e-mail, or through [employer]. If you are happy with your care we would appreciate if you could take just a few minutes to complete the survey questions. We read all of your comments and take your feedback very seriously. Thank you again for choosing our office.  [facility] [employer] Team at [place] at [facility] ([place], [place] [address]) Entrance C, located off of [address] [place] parking  Go to [email or web address] to register for FREE online childbirth classes  Call the office [phone]) or go to [facility] if: You begin to severe cramping Your water breaks.  Sometimes it is a big gush of fluid, sometimes it is just a trickle that keeps getting your panties wet or running down your legs You have vaginal bleeding.  It is normal to have a small amount of spotting if your cervix was checked.   [facility] Pediatricians/Family Doctors [facility]): [address] [place], [facility]: [address] [place], [phone]                [place] [facility]): [place], [phone] (call to ask if accepting patients) [facility]: [address], [place], [place] Pediatricians/Family Doctors [facility]): [place]. [place], [address], [facility]: [address] [place], [place] [facility] [place]: [place], [facility]): [phone] [facility]: [address], [place]: [address], [facility]: [phone], [phone]  Home Blood Pressure Monitoring for Patients   Your provider has recommended that you check your blood pressure (BP) at least once a week at home. If you do not have a blood pressure cuff at home, one will be provided for you. Contact your provider if you have not received your monitor within 1 week.   Helpful Tips for Accurate Home Blood Pressure Checks  Don't smoke, exercise, or drink caffeine 30 minutes before checking your BP Use the restroom before checking your BP (a full bladder can raise your pressure) Relax in a comfortable upright chair Feet on the ground Left arm resting comfortably on a flat surface at the level of your heart Legs uncrossed Back supported Sit quietly and don't talk Place the cuff on your bare arm Adjust snuggly, so that only two fingertips can fit between your skin and the top of the cuff Check 2 readings separated by at least one minute Keep a log of your BP readings For a visual, please reference this diagram: http://ccnc.care/bpdiagram  Provider Name: [facility]     Phone: [phone]  Zone 1: ALL CLEAR  Continue to monitor your symptoms:  BP reading is less than 140 (top number) or less than 90 (bottom number)  No right upper stomach pain No headaches or seeing spots No feeling nauseated or throwing up No swelling in face and hands  Zone 2: CAUTION Call your doctor's office for any of the following:  BP reading is greater than 140 (top number) or greater than  90 (bottom number)  Stomach pain under your ribs in the middle or right side Headaches or seeing spots Feeling nauseated or throwing up Swelling in face and hands  Zone 3: EMERGENCY  Seek immediate medical care if you have any of the following:  BP reading is greater than160 (top number) or greater than 110 (bottom number) Severe headaches not improving with Tylenol Serious difficulty catching your breath Any worsening symptoms from  Zone 2     Second Trimester of Pregnancy The second trimester is from week 14 through week 27 (months 4 through 6). The second trimester is often a time when you feel your best. Your body has adjusted to being pregnant, and you begin to feel better physically. Usually, morning sickness has lessened or quit completely, you may have more energy, and you may have an increase in appetite. The second trimester is also a time when the fetus is growing rapidly. At the end of the sixth month, the fetus is about 9 inches long and weighs about 1 pounds. You will likely begin to feel the baby move (quickening) between 16 and 20 weeks of pregnancy. Body changes during your second trimester Your body continues to go through many changes during your second trimester. The changes vary from woman to woman. Your weight will continue to increase. You will notice your lower abdomen bulging out. You may begin to get stretch marks on your hips, abdomen, and breasts. You may develop headaches that can be relieved by medicines. The medicines should be approved by your health care provider. You may urinate more often because the fetus is pressing on your bladder. You may develop or continue to have heartburn as a result of your pregnancy. You may develop constipation because certain hormones are causing the muscles that push waste through your intestines to slow down. You may develop hemorrhoids or swollen, bulging veins (varicose veins). You may have back pain. This is caused by: Weight gain. Pregnancy hormones that are relaxing the joints in your pelvis. A shift in weight and the muscles that support your balance. Your breasts will continue to grow and they will continue to become tender. Your gums may bleed and may be sensitive to brushing and flossing. Dark spots or blotches (chloasma, mask of pregnancy) may develop on your face. This will likely fade after the baby is born. A dark line from your belly button to  the pubic area (linea nigra) may appear. This will likely fade after the baby is born. You may have changes in your hair. These can include thickening of your hair, rapid growth, and changes in texture. Some women also have hair loss during or after pregnancy, or hair that feels dry or thin. Your hair will most likely return to normal after your baby is born.  What to expect at prenatal visits During a routine prenatal visit: You will be weighed to make sure you and the fetus are growing normally. Your blood pressure will be taken. Your abdomen will be measured to track your baby's growth. The fetal heartbeat will be listened to. Any test results from the previous visit will be discussed.  Your health care provider may ask you: How you are feeling. If you are feeling the baby move. If you have had any abnormal symptoms, such as leaking fluid, bleeding, severe headaches, or abdominal cramping. If you are using any tobacco products, including cigarettes, chewing tobacco, and electronic cigarettes. If you have any questions.  Other tests that may be performed during   your second trimester include: Blood tests that check for: Low iron levels (anemia). High blood sugar that affects pregnant women (gestational diabetes) between 24 and 28 weeks. Rh antibodies. This is to check for a protein on red blood cells (Rh factor). Urine tests to check for infections, diabetes, or protein in the urine. An ultrasound to confirm the proper growth and development of the baby. An amniocentesis to check for possible genetic problems. Fetal screens for spina bifida and Down syndrome. HIV (human immunodeficiency virus) testing. Routine prenatal testing includes screening for HIV, unless you choose not to have this test.  Follow these instructions at home: Medicines Follow your health care provider's instructions regarding medicine use. Specific medicines may be either safe or unsafe to take during  pregnancy. Take a prenatal vitamin that contains at least 600 micrograms (mcg) of folic acid. If you develop constipation, try taking a stool softener if your health care provider approves. Eating and drinking Eat a balanced diet that includes fresh fruits and vegetables, whole grains, good sources of protein such as meat, eggs, or tofu, and low-fat dairy. Your health care provider will help you determine the amount of weight gain that is right for you. Avoid raw meat and uncooked cheese. These carry germs that can cause birth defects in the baby. If you have low calcium intake from food, talk to your health care provider about whether you should take a daily calcium supplement. Limit foods that are high in fat and processed sugars, such as fried and sweet foods. To prevent constipation: Drink enough fluid to keep your urine clear or pale yellow. Eat foods that are high in fiber, such as fresh fruits and vegetables, whole grains, and beans. Activity Exercise only as directed by your health care provider. Most women can continue their usual exercise routine during pregnancy. Try to exercise for 30 minutes at least 5 days a week. Stop exercising if you experience uterine contractions. Avoid heavy lifting, wear low heel shoes, and practice good posture. A sexual relationship may be continued unless your health care provider directs you otherwise. Relieving pain and discomfort Wear a good support bra to prevent discomfort from breast tenderness. Take warm sitz baths to soothe any pain or discomfort caused by hemorrhoids. Use hemorrhoid cream if your health care provider approves. Rest with your legs elevated if you have leg cramps or low back pain. If you develop varicose veins, wear support hose. Elevate your feet for 15 minutes, 3-4 times a day. Limit salt in your diet. Prenatal Care Write down your questions. Take them to your prenatal visits. Keep all your prenatal visits as told by your health  care provider. This is important. Safety Wear your seat belt at all times when driving. Make a list of emergency phone numbers, including numbers for family, friends, the hospital, and police and fire departments. General instructions Ask your health care provider for a referral to a local prenatal education class. Begin classes no later than the beginning of month 6 of your pregnancy. Ask for help if you have counseling or nutritional needs during pregnancy. Your health care provider can offer advice or refer you to specialists for help with various needs. Do not use hot tubs, steam rooms, or saunas. Do not douche or use tampons or scented sanitary pads. Do not cross your legs for long periods of time. Avoid cat litter boxes and soil used by cats. These carry germs that can cause birth defects in the baby and possibly loss of the   fetus by miscarriage or stillbirth. Avoid all smoking, herbs, alcohol, and unprescribed drugs. Chemicals in these products can affect the formation and growth of the baby. Do not use any products that contain nicotine or tobacco, such as cigarettes and e-cigarettes. If you need help quitting, ask your health care provider. Visit your dentist if you have not gone yet during your pregnancy. Use a soft toothbrush to brush your teeth and be gentle when you floss. Contact a health care provider if: You have dizziness. You have mild pelvic cramps, pelvic pressure, or nagging pain in the abdominal area. You have persistent nausea, vomiting, or diarrhea. You have a bad smelling vaginal discharge. You have pain when you urinate. Get help right away if: You have a fever. You are leaking fluid from your vagina. You have spotting or bleeding from your vagina. You have severe abdominal cramping or pain. You have rapid weight gain or weight loss. You have shortness of breath with chest pain. You notice sudden or extreme swelling of your face, hands, ankles, feet, or legs. You  have not felt your baby move in over an hour. You have severe headaches that do not go away when you take medicine. You have vision changes. Summary The second trimester is from week 14 through week 27 (months 4 through 6). It is also a time when the fetus is growing rapidly. Your body goes through many changes during pregnancy. The changes vary from woman to woman. Avoid all smoking, herbs, alcohol, and unprescribed drugs. These chemicals affect the formation and growth your baby. Do not use any tobacco products, such as cigarettes, chewing tobacco, and e-cigarettes. If you need help quitting, ask your health care provider. Contact your health care provider if you have any questions. Keep all prenatal visits as told by your health care provider. This is important. This information is not intended to replace advice given to you by your health care provider. Make sure you discuss any questions you have with your health care provider. Document Released: 07/08/2001 Document Revised: 12/20/2015 Document Reviewed: 09/14/2012 Elsevier Interactive Patient Education  2017 [employer].

## 2022-11-10 ENCOUNTER — Encounter: Payer: Self-pay | Admitting: Women's Health

## 2022-11-10 ENCOUNTER — Other Ambulatory Visit (HOSPITAL_COMMUNITY)
Admission: RE | Admit: 2022-11-10 | Discharge: 2022-11-10 | Disposition: A | Discharge status: Home / Self Care | Payer: Medicaid Other | Source: Ambulatory Visit | Attending: Women's Health | Admitting: Women's Health

## 2022-11-10 ENCOUNTER — Ambulatory Visit (INDEPENDENT_AMBULATORY_CARE_PROVIDER_SITE_OTHER): Payer: Medicaid Other | Admitting: Women's Health

## 2022-11-10 VITALS — BP 127/89 | HR 108 | Wt 202.0 lb

## 2022-11-10 DIAGNOSIS — Z3A23 23 weeks gestation of pregnancy: Secondary | ICD-10-CM

## 2022-11-10 DIAGNOSIS — Z3482 Encounter for supervision of other normal pregnancy, second trimester: Secondary | ICD-10-CM

## 2022-11-10 DIAGNOSIS — N898 Other specified noninflammatory disorders of vagina: Secondary | ICD-10-CM

## 2022-11-10 MED ORDER — PANTOPRAZOLE SODIUM 20 MG PO TBEC: 20.0000 mg | DELAYED_RELEASE_TABLET | Freq: Every day | ORAL | 3 refills | Status: DC

## 2022-11-10 NOTE — Patient Instructions (Addendum)
Carla Rose, thank you for choosing our office today! We appreciate the opportunity to meet your healthcare needs. You may receive a short survey by mail, e-mail, or through [employer]. If you are happy with your care we would appreciate if you could take just a few minutes to complete the survey questions. We read all of your comments and take your feedback very seriously. Thank you again for choosing our office.  Center for [employer] Team at [facility]  [facility] at [facility] ([address] [place], [place] [address]) Entrance C, located off of [address] Free 24/7 valet parking   You will have your sugar test next visit.  Please do not eat or drink anything after midnight the night before you come, not even water.  You will be here for at least two hours.  Please make an appointment online for the bloodwork at [email or web address] for 8:00am (or as close to this as possible). Make sure you select the [facility] service center.   CLASSES: Go to [email or web address] to register for classes (childbirth, breastfeeding, waterbirth, infant CPR, daddy bootcamp, etc.)  Call the office [phone]) or go to [facility] if: You begin to have strong, frequent contractions Your water breaks.  Sometimes it is a big gush of fluid, sometimes it is just a trickle that keeps getting your panties wet or running down your legs You have vaginal bleeding.  It is normal to have a small amount of spotting if your cervix was checked.  You don't feel your baby moving like normal.  If you don't, get you something to eat and drink and lay down and focus on feeling your baby move.   If your baby is still not moving like normal, you should call the office or go to [facility].  Call the office [phone]) or go to [facility] hospital for these signs of pre-eclampsia: Severe headache that does not go away with Tylenol Visual changes- seeing spots, double, blurred vision Pain under your right breast or upper  abdomen that does not go away with Tums or heartburn medicine Nausea and/or vomiting Severe swelling in your hands, feet, and face    [place] Pediatricians/Family Doctors [facility]): [address] Colette Ribas, [phone]           [facility]: [address], [phone]                [facility]): [address], [phone]  [facility]: [address], [place], [phone]    [facility] Pediatricians/Family Doctors [facility]): [address], [phone] [facility]: [address] [place], [phone] [facility]: [address], [phone]  [facility] Doctors  [facility]): [phone] [facility]: [address], [phone]   [facility] Doctors [facility]: [address] [phone]  [facility] Doctors  [facility]: [phone], [phone]  Home Blood Pressure Monitoring for Patients   Your provider has recommended that you check your blood pressure (BP) at least once a week at home. If you do not have a blood pressure cuff at home, one will be provided for you. Contact your provider if you have not received your monitor within 1 week.   Helpful Tips for Accurate Home Blood Pressure Checks  Don't smoke, exercise, or drink caffeine 30 minutes before checking  your BP Use the restroom before checking your BP (a full bladder can raise your pressure) Relax in a comfortable upright chair Feet on the ground Left arm resting comfortably on a flat surface at the level of your heart Legs uncrossed Back supported Sit quietly and don't talk Place the cuff on your bare arm Adjust snuggly, so that only two fingertips can fit between your skin and the top of the cuff Check 2 readings separated by at least one  minute Keep a log of your BP readings For a visual, please reference this diagram: http://ccnc.care/bpdiagram  Provider Name: [facility]     Phone: [phone]  Zone 1: ALL CLEAR  Continue to monitor your symptoms:  BP reading is less than 140 (top number) or less than 90 (bottom number)  No right upper stomach pain No headaches or seeing spots No feeling nauseated or throwing up No swelling in face and hands  Zone 2: CAUTION Call your doctor's office for any of the following:  BP reading is greater than 140 (top number) or greater than 90 (bottom number)  Stomach pain under your ribs in the middle or right side Headaches or seeing spots Feeling nauseated or throwing up Swelling in face and hands  Zone 3: EMERGENCY  Seek immediate medical care if you have any of the following:  BP reading is greater than160 (top number) or greater than 110 (bottom number) Severe headaches not improving with Tylenol Serious difficulty catching your breath Any worsening symptoms from Zone 2   Second Trimester of Pregnancy The second trimester is from week 13 through week 28, months 4 through 6. The second trimester is often a time when you feel your best. Your body has also adjusted to being pregnant, and you begin to feel better physically. Usually, morning sickness has lessened or quit completely, you may have more energy, and you may have an increase in appetite. The second trimester is also a time when the fetus is growing rapidly. At the end of the sixth month, the fetus is about 9 inches long and weighs about 1 pounds. You will likely begin to feel the baby move (quickening) between 18 and 20 weeks of the pregnancy. BODY CHANGES Your body goes through many changes during pregnancy. The changes vary from woman to woman.  Your weight will continue to increase. You will notice your lower abdomen bulging out. You may begin to get stretch marks on your hips, abdomen, and breasts. You may  develop headaches that can be relieved by medicines approved by your health care provider. You may urinate more often because the fetus is pressing on your bladder. You may develop or continue to have heartburn as a result of your pregnancy. You may develop constipation because certain hormones are causing the muscles that push waste through your intestines to slow down. You may develop hemorrhoids or swollen, bulging veins (varicose veins). You may have back pain because of the weight gain and pregnancy hormones relaxing your joints between the bones in your pelvis and as a result of a shift in weight and the muscles that support your balance. Your breasts will continue to grow and be tender. Your gums may bleed and may be sensitive to brushing and flossing. Dark spots or blotches (chloasma, mask of pregnancy) may develop on your face. This will likely fade after the baby is born. A dark line from your belly button to the pubic area (linea nigra) may appear. This will likely fade after the  baby is born. You may have changes in your hair. These can include thickening of your hair, rapid growth, and changes in texture. Some women also have hair loss during or after pregnancy, or hair that feels dry or thin. Your hair will most likely return to normal after your baby is born. WHAT TO EXPECT AT YOUR PRENATAL VISITS During a routine prenatal visit: You will be weighed to make sure you and the fetus are growing normally. Your blood pressure will be taken. Your abdomen will be measured to track your baby's growth. The fetal heartbeat will be listened to. Any test results from the previous visit will be discussed. Your health care provider may ask you: How you are feeling. If you are feeling the baby move. If you have had any abnormal symptoms, such as leaking fluid, bleeding, severe headaches, or abdominal cramping. If you have any questions. Other tests that may be performed during your second  trimester include: Blood tests that check for: Low iron levels (anemia). Gestational diabetes (between 24 and 28 weeks). Rh antibodies. Urine tests to check for infections, diabetes, or protein in the urine. An ultrasound to confirm the proper growth and development of the baby. An amniocentesis to check for possible genetic problems. Fetal screens for spina bifida and Down syndrome. HOME CARE INSTRUCTIONS  Avoid all smoking, herbs, alcohol, and unprescribed drugs. These chemicals affect the formation and growth of the baby. Follow your health care provider's instructions regarding medicine use. There are medicines that are either safe or unsafe to take during pregnancy. Exercise only as directed by your health care provider. Experiencing uterine cramps is a good sign to stop exercising. Continue to eat regular, healthy meals. Wear a good support bra for breast tenderness. Do not use hot tubs, steam rooms, or saunas. Wear your seat belt at all times when driving. Avoid raw meat, uncooked cheese, cat litter boxes, and soil used by cats. These carry germs that can cause birth defects in the baby. Take your prenatal vitamins. Try taking a stool softener (if your health care provider approves) if you develop constipation. Eat more high-fiber foods, such as fresh vegetables or fruit and whole grains. Drink plenty of fluids to keep your urine clear or pale yellow. Take warm sitz baths to soothe any pain or discomfort caused by hemorrhoids. Use hemorrhoid cream if your health care provider approves. If you develop varicose veins, wear support hose. Elevate your feet for 15 minutes, 3-4 times a day. Limit salt in your diet. Avoid heavy lifting, wear low heel shoes, and practice good posture. Rest with your legs elevated if you have leg cramps or low back pain. Visit your dentist if you have not gone yet during your pregnancy. Use a soft toothbrush to brush your teeth and be gentle when you floss. A  sexual relationship may be continued unless your health care provider directs you otherwise. Continue to go to all your prenatal visits as directed by your health care provider. SEEK MEDICAL CARE IF:  You have dizziness. You have mild pelvic cramps, pelvic pressure, or nagging pain in the abdominal area. You have persistent nausea, vomiting, or diarrhea. You have a bad smelling vaginal discharge. You have pain with urination. SEEK IMMEDIATE MEDICAL CARE IF:  You have a fever. You are leaking fluid from your vagina. You have spotting or bleeding from your vagina. You have severe abdominal cramping or pain. You have rapid weight gain or loss. You have shortness of breath with chest pain. You  notice sudden or extreme swelling of your face, hands, ankles, feet, or legs. You have not felt your baby move in over an hour. You have severe headaches that do not go away with medicine. You have vision changes. Document Released: 07/08/2001 Document Revised: 07/19/2013 Document Reviewed: 09/14/2012 [employer] Patient Information 2015 [place], [place]. This information is not intended to replace advice given to you by your health care provider. Make sure you discuss any questions you have with your health care provider.  Sciatica Rehab Ask your health care provider which exercises are safe for you. Do exercises exactly as told by your health care provider and adjust them as directed. It is normal to feel mild stretching, pulling, tightness, or discomfort as you do these exercises. Stop right away if you feel sudden pain or your pain gets worse. Do not begin these exercises until told by your health care provider. Stretching and range-of-motion exercises These exercises warm up your muscles and joints and improve the movement and flexibility of your hips and back. These exercises also help to relieve pain, numbness, and tingling. Sciatic nerve glide  Sit in a chair with your head facing down toward your  chest. Place your hands behind your back. Let your shoulders slump forward. Slowly straighten one of your legs while you tilt your head back as if you are looking toward the ceiling. Only straighten your leg as far as you can without making your symptoms worse. Hold this position for __________ seconds. Slowly return your leg and head back to the starting position. Repeat with your other leg. Repeat __________ times. Complete this exercise __________ times a day. Knee to chest with hip adduction and internal rotation  Lie on your back on a firm surface with both legs straight. Bend one of your knees and move it up toward your chest until you feel a gentle stretch in your lower back and buttock. Then, move your knee toward the shoulder that is on the opposite side from your leg. This is hip adduction and internal rotation. Hold your leg in this position by holding on to the front of your knee. Hold this position for __________ seconds. Slowly return to the starting position. Repeat with your other leg. Repeat __________ times. Complete this exercise __________ times a day. Prone extension on elbows  Lie on your abdomen on a firm surface. A bed may be too soft for this exercise. Prop yourself up on your elbows. Use your arms to help lift your chest up until you feel a gentle stretch in your abdomen and your lower back. This will place some of your body weight on your elbows. If this is uncomfortable, try stacking pillows under your chest. Your hips should stay down, against the surface that you are lying on. Keep your hip and back muscles relaxed. Hold this position for __________ seconds. Slowly relax your upper body and return to the starting position. Repeat __________ times. Complete this exercise __________ times a day. Strengthening exercises These exercises build strength and endurance in your back. Endurance is the ability to use your muscles for a long time, even after they get  tired. Pelvic tilt This exercise strengthens the muscles that lie deep in the abdomen. Lie on your back on a firm surface. Bend your knees and keep your feet flat on the surface. Tense your abdominal muscles. Tip your pelvis up toward the ceiling and flatten your lower back into the firm surface. To help with this exercise, you may place a small  towel under your lower back and try to push your back into the towel. Hold this position for __________ seconds. Let your muscles relax completely before you repeat this exercise. Repeat __________ times. Complete this exercise __________ times a day. Alternating arm and leg raises  Get on your hands and knees on a firm surface. If you are on a hard floor, you may want to use padding, such as an exercise mat, to cushion your knees. Line up your arms and legs. Your hands should be directly below your shoulders, and your knees should be directly below your hips. Lift your left leg behind you. At the same time, raise your right arm and straighten it in front of you. Do not lift your leg higher than your hip. Do not lift your arm higher than your shoulder. Keep your abdominal and back muscles tight. Keep your hips facing the ground. Do not arch your back. Keep your balance carefully, and do not hold your breath. Hold this position for __________ seconds. Slowly return to the starting position. Repeat with your right leg and your left arm. Repeat __________ times. Complete this exercise __________ times a day. Posture and body mechanics Good posture and healthy body mechanics can help to relieve stress in your body's tissues and joints. Body mechanics refers to the movements and positions of your body while you do your daily activities. Posture is part of body mechanics. Good posture means: Your spine is in its natural S-curve position (neutral). Your shoulders are pulled back slightly. Your head is not tipped forward. Follow these guidelines to  improve your posture and body mechanics in your everyday activities. Standing  When standing, keep your spine neutral and your feet about hip width apart. Keep a slight bend in your knees. Your ears, shoulders, and hips should line up. When you do a task in which you stand in one place for a long time, place one foot up on a stable object that is 2-4 inches (5-10 cm) high, such as a footstool. This helps keep your spine neutral. Sitting  When sitting, keep your spine neutral and keep your feet flat on the floor. Use a footrest, if necessary, and keep your thighs parallel to the floor. Avoid rounding your shoulders, and avoid tilting your head forward. When working at a desk or a computer, keep your desk at a height where your hands are slightly lower than your elbows. Slide your chair under your desk so you are close enough to maintain good posture. When working at a computer, place your monitor at a height where you are looking straight ahead and you do not have to tilt your head forward or downward to look at the screen. Resting  When lying down and resting, avoid positions that are most painful for you. If you have pain with activities such as sitting, bending, stooping, or squatting, lie in a position in which your body does not bend very much. For example, avoid curling up on your side with your arms and knees near your chest (fetal position). If you have pain with activities such as standing for a long time or reaching with your arms, lie with your spine in a neutral position and bend your knees slightly. Try the following positions: Lying on your side with a pillow between your knees. Lying on your back with a pillow under your knees. Lifting  When lifting objects, keep your feet at least shoulder width apart and tighten your abdominal muscles. Bend your knees and  hips and keep your spine neutral. It is important to lift using the strength of your legs, not your back. Do not lock your  knees straight out. Always ask for help to lift heavy or awkward objects. This information is not intended to replace advice given to you by your health care provider. Make sure you discuss any questions you have with your health care provider. Document Revised: 10/22/2021 Document Reviewed: 10/22/2021 Elsevier Patient Education  2023 [employer].

## 2022-11-10 NOTE — Progress Notes (Signed)
LOW-RISK PREGNANCY VISIT Patient name: Carla Rose MRN [id number]  Date of birth: [birth year] Chief Complaint:   Routine Prenatal Visit  History of Present Illness:   Carla Rose is a 27 y.o. [id number] female at [redacted]w[redacted]d with an Estimated Date of Delivery: 03/04/23 being seen today for ongoing management of a low-risk pregnancy.   Today she reports  vulvovaginal itching, has tried otc cream but not really helping. Bad reflux, doesn't like chalky taste of TUMS . Occ numbness Rt leg.  Contractions: Irregular.  .  Movement: Present. denies leaking of fluid.     08/18/2022    4:12 PM 10/28/2021   11:09 AM 10/10/2021    2:13 PM 02/25/2018    4:11 PM  Depression screen PHQ 2/9  Decreased Interest 3 0 0 0  Down, Depressed, Hopeless 2 0 0 0  PHQ - 2 Score 5 0 0 0  Altered sleeping 2 0 0 0  Tired, decreased energy 3 0 1 0  Change in appetite 1 0 0 0  Feeling bad or failure about yourself  3 0 1 0  Trouble concentrating 0 0 0 0  Moving slowly or fidgety/restless 0 0 0 0  Suicidal thoughts 0 0 0 0  PHQ-9 Score 14 0 2 0        08/18/2022    4:12 PM 10/28/2021   11:10 AM 10/10/2021    2:13 PM  GAD 7 : Generalized Anxiety Score  Nervous, Anxious, on Edge 2 0 0  Control/stop worrying 2 0 0  Worry too much - different things 2 0 0  Trouble relaxing 2 0 0  Restless 0 0 0  Easily annoyed or irritable 0 0 0  Afraid - awful might happen 0 0 0  Total GAD 7 Score 8 0 0      Review of Systems:   Pertinent items are noted in HPI Denies abnormal vaginal discharge w/ itching/odor/irritation, headaches, visual changes, shortness of breath, chest pain, abdominal pain, severe nausea/vomiting, or problems with urination or bowel movements unless otherwise stated above. Pertinent History Reviewed:  Reviewed past medical,surgical, social, obstetrical and family history.  Reviewed problem list, medications and allergies. Physical Assessment:   Vitals:   11/10/22 1357  BP: 127/89  Pulse: (!) 108   Weight: 202 lb (91.6 kg)  Body mass index is 30.71 kg/m.        Physical Examination:   General appearance: Well appearing, and in no distress  Mental status: Alert, oriented to person, place, and time  Skin: Warm & dry  Cardiovascular: Normal heart rate noted  Respiratory: Normal respiratory effort, no distress  Abdomen: Soft, gravid, nontender  Pelvic:  spec exam w/ CV swab          Extremities: Edema: Trace  Fetal Status: Fetal Heart Rate (bpm): 148 Fundal Height: 25 cm Movement: Present    Chaperone: Peggy Dones   No results found for this or any previous visit (from the past 24 hour(s)).  Assessment & Plan:  1) Low-risk pregnancy [id number] at [redacted]w[redacted]d with an Estimated Date of Delivery: 03/04/23   2) Vulvovaginal itching, CV swab  3) Reflux> rx protonix  4) Rt sciatica> gave printed exercises  5) H/O GHTN & PPHTN   Meds:  Meds ordered this encounter  Medications   pantoprazole (PROTONIX) 20 MG tablet    Sig: Take 1 tablet (20 mg total) by mouth daily.    Dispense:  30 tablet    Refill:  3  Labs/procedures today: spec exam and CV swab  Plan:  Continue routine obstetrical care  Next visit: prefers will be in person for pn2     Reviewed: Preterm labor symptoms and general obstetric precautions including but not limited to vaginal bleeding, contractions, leaking of fluid and fetal movement were reviewed in detail with the patient.  All questions were answered. Does have home bp cuff. Office bp cuff given: not applicable. Check bp weekly, let us know if consistently >140 and/or >90.  Follow-up: Return in about 4 weeks (around 12/08/2022) for LROB, PN2, CNM, in person.  No future appointments.  No orders of the defined types were placed in this encounter.  Cheral Marker CNM, [facility] 11/10/2022 2:33 PM

## 2022-11-12 LAB — CERVICOVAGINAL ANCILLARY ONLY
Bacterial Vaginitis (gardnerella): POSITIVE — AB
Candida Glabrata: NEGATIVE
Candida Vaginitis: POSITIVE — AB
Chlamydia: NEGATIVE
Comment: NEGATIVE
Comment: NEGATIVE
Comment: NEGATIVE
Comment: NEGATIVE
Comment: NEGATIVE
Comment: NORMAL
Neisseria Gonorrhea: NEGATIVE
Trichomonas: NEGATIVE

## 2022-11-17 MED ORDER — METRONIDAZOLE 500 MG PO TABS
500.0000 mg | ORAL_TABLET | Freq: Two times a day (BID) | ORAL | 0 refills | Status: DC
Start: 2022-11-17 — End: 2023-01-20

## 2022-11-17 MED ORDER — TERCONAZOLE 0.4 % VA CREA: 1.0000 | TOPICAL_CREAM | Freq: Every day | VAGINAL | 0 refills | Status: DC

## 2022-11-17 NOTE — Addendum Note (Signed)
Addended by: Cheral Marker on: 11/17/2022 11:44 AM   Modules accepted: Orders

## 2022-12-08 ENCOUNTER — Encounter: Payer: Medicaid Other | Admitting: Advanced Practice Midwife

## 2022-12-08 ENCOUNTER — Other Ambulatory Visit: Payer: Medicaid Other

## 2022-12-08 DIAGNOSIS — Z3482 Encounter for supervision of other normal pregnancy, second trimester: Secondary | ICD-10-CM

## 2022-12-08 DIAGNOSIS — Z3A27 27 weeks gestation of pregnancy: Secondary | ICD-10-CM

## 2022-12-08 DIAGNOSIS — Z131 Encounter for screening for diabetes mellitus: Secondary | ICD-10-CM

## 2022-12-30 ENCOUNTER — Encounter: Payer: Self-pay | Admitting: Obstetrics and Gynecology

## 2022-12-30 ENCOUNTER — Ambulatory Visit (INDEPENDENT_AMBULATORY_CARE_PROVIDER_SITE_OTHER): Payer: Medicaid Other | Admitting: Obstetrics and Gynecology

## 2022-12-30 VITALS — BP 125/82 | HR 103 | Wt 212.0 lb

## 2022-12-30 DIAGNOSIS — Z3403 Encounter for supervision of normal first pregnancy, third trimester: Secondary | ICD-10-CM

## 2022-12-30 DIAGNOSIS — Z8759 Personal history of other complications of pregnancy, childbirth and the puerperium: Secondary | ICD-10-CM

## 2022-12-30 DIAGNOSIS — Z3A30 30 weeks gestation of pregnancy: Secondary | ICD-10-CM

## 2022-12-30 NOTE — Progress Notes (Signed)
   PRENATAL VISIT NOTE  Subjective:  Carla Rose is a 27 y.o. [phone] at [redacted]w[redacted]d being seen today for ongoing prenatal care.  She is currently monitored for the following issues for this low-risk pregnancy and has History of gestational hypertension & PPHTN; Abnormal Pap smear of cervix; Supervision of normal pregnancy; and History of ectopic pregnancy on their problem list.  Patient reports no complaints.Denies leaking of fluid vaginal bleeding, contractions. Movement: present  The following portions of the patient's history were reviewed and updated as appropriate: allergies, current medications, past family history, past medical history, past social history, past surgical history and problem list.   Objective:   Vitals:   12/30/22 1108 12/30/22 1110  BP: (!) 140/95 125/82  Pulse: (!) 120 (!) 103  Weight: 212 lb (96.2 kg)     Fetal Status: Fetal Heart Rate (bpm): 132 Fundal Height: 31 cm Movement: Present     General:  Alert, oriented and cooperative. Patient is in no acute distress.  Skin: Skin is warm and dry. No rash noted.   Cardiovascular: Normal heart rate noted  Respiratory: Normal respiratory effort, no problems with respiration noted  Abdomen: Soft, gravid, appropriate for gestational age.  Pain/Pressure: Absent     Pelvic: Cervical exam deferred        Extremities: Normal range of motion.  Edema: None  Mental Status: Normal mood and affect. Normal behavior. Normal judgment and thought content.   Assessment and Plan:  Pregnancy: [id number] at [redacted]w[redacted]d  1. Encounter for supervision of low-risk first pregnancy in third trimester BP and FHR normal FH appropriate Feeling well overall, vigorous fetal movement Has not completed glucose test and 28 week labs yet, [facility]  2. History of gestational hypertension & PPHTN Normotensive today after recheck. Continue to monitor   Preterm labor symptoms and general obstetric precautions including but not  limited to vaginal bleeding, contractions, leaking of fluid and fetal movement were reviewed in detail with the patient. Please refer to After Visit Summary for other counseling recommendations.   Return in 2 weeks for routine prenatal care Future Appointments  Date Time Provider Department Center  01/13/2023  3:50 PM Cheral Marker, CNM [facility]     Albertine Grates, FNP

## 2023-01-13 ENCOUNTER — Ambulatory Visit (INDEPENDENT_AMBULATORY_CARE_PROVIDER_SITE_OTHER): Payer: BC Managed Care – PPO | Admitting: Women's Health

## 2023-01-13 ENCOUNTER — Encounter: Payer: Self-pay | Admitting: Women's Health

## 2023-01-13 VITALS — BP 129/79 | HR 128 | Wt 215.8 lb

## 2023-01-13 DIAGNOSIS — K0889 Other specified disorders of teeth and supporting structures: Secondary | ICD-10-CM

## 2023-01-13 DIAGNOSIS — Z3482 Encounter for supervision of other normal pregnancy, second trimester: Secondary | ICD-10-CM

## 2023-01-13 DIAGNOSIS — Z3A32 32 weeks gestation of pregnancy: Secondary | ICD-10-CM

## 2023-01-13 DIAGNOSIS — Z23 Encounter for immunization: Secondary | ICD-10-CM

## 2023-01-13 MED ORDER — PENICILLIN V POTASSIUM 500 MG PO TABS
500.0000 mg | ORAL_TABLET | Freq: Four times a day (QID) | ORAL | 0 refills | Status: DC
Start: 2023-01-13 — End: 2023-01-22

## 2023-01-13 NOTE — Progress Notes (Signed)
LOW-RISK PREGNANCY VISIT Patient name: Carla Rose MRN [id number]  Date of birth: [birth year] Chief Complaint:   Routine Prenatal Visit  History of Present Illness:   Carla Rose is a 27 y.o. [id number] female at [redacted]w[redacted]d with an Estimated Date of Delivery: 03/04/23 being seen today for ongoing management of a low-risk pregnancy.   Today she reports  hasn't done pn2 yet-has to do at [facility] and hasn't made appt, promises to call tomorrow AM. Dental pain/gums swelling, went to dentist at beginning of pregnancy, has 4 teeth that need to be pulled, requests antibiotics today. . Contractions: Not present.  .  Movement: Present. denies leaking of fluid.     08/18/2022    4:12 PM 10/28/2021   11:09 AM 10/10/2021    2:13 PM 02/25/2018    4:11 PM  Depression screen PHQ 2/9  Decreased Interest 3 0 0 0  Down, Depressed, Hopeless 2 0 0 0  PHQ - 2 Score 5 0 0 0  Altered sleeping 2 0 0 0  Tired, decreased energy 3 0 1 0  Change in appetite 1 0 0 0  Feeling bad or failure about yourself  3 0 1 0  Trouble concentrating 0 0 0 0  Moving slowly or fidgety/restless 0 0 0 0  Suicidal thoughts 0 0 0 0  PHQ-9 Score 14 0 2 0        08/18/2022    4:12 PM 10/28/2021   11:10 AM 10/10/2021    2:13 PM  GAD 7 : Generalized Anxiety Score  Nervous, Anxious, on Edge 2 0 0  Control/stop worrying 2 0 0  Worry too much - different things 2 0 0  Trouble relaxing 2 0 0  Restless 0 0 0  Easily annoyed or irritable 0 0 0  Afraid - awful might happen 0 0 0  Total GAD 7 Score 8 0 0      Review of Systems:   Pertinent items are noted in HPI Denies abnormal vaginal discharge w/ itching/odor/irritation, headaches, visual changes, shortness of breath, chest pain, abdominal pain, severe nausea/vomiting, or problems with urination or bowel movements unless otherwise stated above. Pertinent History Reviewed:  Reviewed past medical,surgical, social, obstetrical and family history.  Reviewed problem list, medications and  allergies. Physical Assessment:   Vitals:   01/13/23 1542  BP: 129/79  Pulse: (!) 128  Weight: 215 lb 12.8 oz (97.9 kg)  Body mass index is 32.81 kg/m.        Physical Examination:   General appearance: Well appearing, and in no distress  Mental status: Alert, oriented to person, place, and time  Skin: Warm & dry  Cardiovascular: Normal heart rate noted  Respiratory: Normal respiratory effort, no distress  Abdomen: Soft, gravid, nontender  Pelvic: Cervical exam deferred         Extremities: Edema: Trace  Fetal Status: Fetal Heart Rate (bpm): 145 Fundal Height: 33 cm Movement: Present    Chaperone: N/A   No results found for this or any previous visit (from the past 24 hour(s)).  Assessment & Plan:  1) Low-risk pregnancy [id number] at [redacted]w[redacted]d with an Estimated Date of Delivery: 03/04/23   2) Dental pain/inflamed gums, rx PCN, pt to call dentist for appt asap  3) H/O GHTN & PPHTN> ASA  4) Needs PN2> doing w/ [facility] b/c of [facility] balance, already closed today, promises to call in AM to schedule appt for this week.   Meds:  Meds ordered this encounter  Medications  penicillin v potassium (VEETID) 500 MG tablet    Sig: Take 1 tablet (500 mg total) by mouth 4 (four) times daily. X 7 days    Dispense:  28 tablet    Refill:  0   Labs/procedures today: tdap  Plan:  Continue routine obstetrical care  Next visit: prefers in person    Reviewed: Preterm labor symptoms and general obstetric precautions including but not limited to vaginal bleeding, contractions, leaking of fluid and fetal movement were reviewed in detail with the patient.  All questions were answered. Does have home bp cuff. Office bp cuff given: not applicable. Check bp weekly, let us know if consistently >140 and/or >90.  Follow-up: Return for ASAP pn2 (no visit) at [facility]; then 2wks for LROB w/ CNM in person.  No future appointments.  No orders of the defined types were placed in this encounter.  Cheral Marker CNM, [facility] 01/13/2023 4:32 PM

## 2023-01-13 NOTE — Patient Instructions (Signed)
Carla Rose, thank you for choosing our office today! We appreciate the opportunity to meet your healthcare needs. You may receive a short survey by mail, e-mail, or through [employer]. If you are happy with your care we would appreciate if you could take just a few minutes to complete the survey questions. We read all of your comments and take your feedback very seriously. Thank you again for choosing our office.  Center for [employer] Team at [facility]  [facility] at [facility] ([address] [place], [place] [address]) Entrance C, located off of [address] Free 24/7 valet parking   You will have your sugar test next visit.  Please do not eat or drink anything after midnight the night before you come, not even water.  You will be here for at least two hours.  Please make an appointment online for the bloodwork at [email or web address] for 8:30am (or as close to this as possible). Make sure you select the [facility] service center. The day of the appointment, check in with our office first, then you will go to [facility] to start the sugar test.    CLASSES: Go to [email or web address] to register for classes (childbirth, breastfeeding, waterbirth, infant CPR, daddy bootcamp, etc.)  Call the office [phone]) or go to [facility] if: You begin to have strong, frequent contractions Your water breaks.  Sometimes it is a big gush of fluid, sometimes it is just a trickle that keeps getting your panties wet or running down your legs You have vaginal bleeding.  It is normal to have a small amount of spotting if your cervix was checked.  You don't feel your baby moving like normal.  If you don't, get you something to eat and drink and lay down and focus on feeling your baby move.   If your baby is still not moving like normal, you should call the office or go to [facility].  Call the office [phone]) or go to [facility] hospital for these signs of pre-eclampsia: Severe headache that does not  go away with Tylenol Visual changes- seeing spots, double, blurred vision Pain under your right breast or upper abdomen that does not go away with Tums or heartburn medicine Nausea and/or vomiting Severe swelling in your hands, feet, and face   Tdap Vaccine It is recommended that you get the Tdap vaccine during the third trimester of EACH pregnancy to help protect your baby from getting pertussis (whooping cough) 27-36 weeks is the BEST time to do this so that you can pass the protection on to your baby. During pregnancy is better than after pregnancy, but if you are unable to get it during pregnancy it will be offered at the hospital.  You can get this vaccine with [place], at the health department, your family doctor, or some local pharmacies Everyone who will be around your baby should also be up-to-date on their vaccines before the baby comes. Adults (who are not pregnant) only need 1 dose of Tdap during adulthood.   [facility] Pediatricians/Family Doctors  Pediatrics [facility]): [address] Colette Ribas, [phone]           [facility]: [address], [phone]                [facility]): [address], [phone] (call to ask if accepting patients) [facility]: [address], [place], [phone]    [facility] Pediatricians/Family [facility] (  [facility]): [address], [phone] [facility]: [address] [place], [phone] [facility]: [address], [phone]  [facility] Doctors  [facility]): [phone] [facility]: [address] [phone]   [facility] [address] [facility] [address] Doctors [facility]: [address] [phone]  [facility] Doctors  [facility]: [phone], [phone]  Home Blood Pressure Monitoring for Patients   Your  provider has recommended that you check your blood pressure (BP) at least once a week at home. If you do not have a blood pressure cuff at home, one will be provided for you. Contact your provider if you have not received your monitor within 1 week.   Helpful Tips for Accurate Home Blood Pressure Checks  Don't smoke, exercise, or drink caffeine 30 minutes before checking your BP Use the restroom before checking your BP (a full bladder can raise your pressure) Relax in a comfortable upright chair Feet on the ground Left arm resting comfortably on a flat surface at the level of your heart Legs uncrossed Back supported Sit quietly and don't talk Place the cuff on your bare arm Adjust snuggly, so that only two fingertips can fit between your skin and the top of the cuff Check 2 readings separated by at least one minute Keep a log of your BP readings For a visual, please reference this diagram: http://ccnc.care/bpdiagram  Provider Name: [facility]     Phone: [phone]  Zone 1: ALL CLEAR  Continue to monitor your symptoms:  BP reading is less than 140 (top number) or less than 90 (bottom number)  No right upper stomach pain No headaches or seeing spots No feeling nauseated or throwing up No swelling in face and hands  Zone 2: CAUTION Call your doctor's office for any of the following:  BP reading is greater than 140 (top number) or greater than 90 (bottom number)  Stomach pain under your ribs in the middle or right side Headaches or seeing spots Feeling nauseated or throwing up Swelling in face and hands  Zone 3: EMERGENCY  Seek immediate medical care if you have any of the following:  BP reading is greater than160 (top number) or greater than 110 (bottom number) Severe headaches not improving with Tylenol Serious difficulty catching your breath Any worsening symptoms from Zone 2  Preterm Labor and Birth Information  The normal length of a pregnancy is 39-41 weeks.  Preterm labor is when labor starts before 37 completed weeks of pregnancy. What are the risk factors for preterm labor? Preterm labor is more likely to occur in women who: Have certain infections during pregnancy such as a bladder infection, sexually transmitted infection, or infection inside the uterus (chorioamnionitis). Have a shorter-than-normal cervix. Have gone into preterm labor before. Have had surgery on their cervix. Are younger than age 61 or older than age 51. Are African American. Are pregnant with twins or multiple babies (multiple gestation). Take street drugs or smoke while pregnant. Do not gain enough weight while pregnant. Became pregnant shortly after having been pregnant. What are the symptoms of preterm labor? Symptoms of preterm labor include: Cramps similar to those that can happen during a menstrual period. The cramps may happen with diarrhea. Pain in the abdomen or lower back. Regular uterine contractions that may feel like tightening of the abdomen. A feeling of increased pressure in the pelvis. Increased watery or bloody mucus discharge from the  vagina. Water breaking (ruptured amniotic sac). Why is it important to recognize signs of preterm labor? It is important to recognize signs of preterm labor because babies who are born prematurely may not be fully developed. This can put them at an increased risk for: Long-term (chronic) heart and lung problems. Difficulty immediately after birth with regulating body systems, including blood sugar, body temperature, heart rate, and breathing rate. Bleeding in the brain. Cerebral palsy. Learning difficulties. Death. These risks are highest for babies who are born before 34 weeks of pregnancy. How is preterm labor treated? Treatment depends on the length of your pregnancy, your condition, and the health of your baby. It may involve: Having a stitch (suture) placed in your cervix to prevent your cervix from opening too  early (cerclage). Taking or being given medicines, such as: Hormone medicines. These may be given early in pregnancy to help support the pregnancy. Medicine to stop contractions. Medicines to help mature the baby's lungs. These may be prescribed if the risk of delivery is high. Medicines to prevent your baby from developing cerebral palsy. If the labor happens before 34 weeks of pregnancy, you may need to stay in the hospital. What should I do if I think I am in preterm labor? If you think that you are going into preterm labor, call your health care provider right away. How can I prevent preterm labor in future pregnancies? To increase your chance of having a full-term pregnancy: Do not use any tobacco products, such as cigarettes, chewing tobacco, and e-cigarettes. If you need help quitting, ask your health care provider. Do not use street drugs or medicines that have not been prescribed to you during your pregnancy. Talk with your health care provider before taking any herbal supplements, even if you have been taking them regularly. Make sure you gain a healthy amount of weight during your pregnancy. Watch for infection. If you think that you might have an infection, get it checked right away. Make sure to tell your health care provider if you have gone into preterm labor before. This information is not intended to replace advice given to you by your health care provider. Make sure you discuss any questions you have with your health care provider. Document Revised: 11/05/2018 Document Reviewed: 12/05/2015 Elsevier Patient Education  2020 [employer].

## 2023-01-20 ENCOUNTER — Inpatient Hospital Stay (HOSPITAL_COMMUNITY): Payer: BC Managed Care – PPO

## 2023-01-20 ENCOUNTER — Observation Stay (HOSPITAL_COMMUNITY)
Admission: AD | Admit: 2023-01-20 | Discharge: 2023-01-22 | DRG: 833 | Disposition: A | Discharge status: Home / Self Care | Payer: BC Managed Care – PPO | Attending: Obstetrics and Gynecology | Admitting: Obstetrics and Gynecology

## 2023-01-20 ENCOUNTER — Inpatient Hospital Stay (HOSPITAL_BASED_OUTPATIENT_CLINIC_OR_DEPARTMENT_OTHER): Payer: BC Managed Care – PPO

## 2023-01-20 ENCOUNTER — Encounter (HOSPITAL_COMMUNITY): Payer: Self-pay | Admitting: Obstetrics & Gynecology

## 2023-01-20 DIAGNOSIS — R1013 Epigastric pain: Secondary | ICD-10-CM | POA: Diagnosis present

## 2023-01-20 DIAGNOSIS — O368390 Maternal care for abnormalities of the fetal heart rate or rhythm, unspecified trimester, not applicable or unspecified: Secondary | ICD-10-CM | POA: Diagnosis present

## 2023-01-20 DIAGNOSIS — R102 Pelvic and perineal pain: Secondary | ICD-10-CM | POA: Diagnosis not present

## 2023-01-20 DIAGNOSIS — Z3A33 33 weeks gestation of pregnancy: Secondary | ICD-10-CM | POA: Diagnosis not present

## 2023-01-20 DIAGNOSIS — O99891 Other specified diseases and conditions complicating pregnancy: Secondary | ICD-10-CM

## 2023-01-20 DIAGNOSIS — Z5986 Financial insecurity: Secondary | ICD-10-CM | POA: Diagnosis not present

## 2023-01-20 DIAGNOSIS — O368330 Maternal care for abnormalities of the fetal heart rate or rhythm, third trimester, not applicable or unspecified: Secondary | ICD-10-CM | POA: Diagnosis not present

## 2023-01-20 DIAGNOSIS — R000 Tachycardia, unspecified: Secondary | ICD-10-CM | POA: Diagnosis not present

## 2023-01-20 DIAGNOSIS — Z87891 Personal history of nicotine dependence: Secondary | ICD-10-CM

## 2023-01-20 DIAGNOSIS — Z7982 Long term (current) use of aspirin: Secondary | ICD-10-CM | POA: Diagnosis not present

## 2023-01-20 DIAGNOSIS — O288 Other abnormal findings on antenatal screening of mother: Principal | ICD-10-CM

## 2023-01-20 LAB — URINALYSIS, ROUTINE W REFLEX MICROSCOPIC
Bilirubin Urine: NEGATIVE
Glucose, UA: NEGATIVE mg/dL
Hgb urine dipstick: NEGATIVE
Ketones, ur: NEGATIVE mg/dL
Leukocytes,Ua: NEGATIVE
Nitrite: NEGATIVE
Protein, ur: NEGATIVE mg/dL
Specific Gravity, Urine: 1.01 (ref 1.005–1.030)
pH: 7 (ref 5.0–8.0)

## 2023-01-20 LAB — COMPREHENSIVE METABOLIC PANEL
ALT: 22 U/L (ref 0–44)
AST: 20 U/L (ref 15–41)
Albumin: 2.8 g/dL — ABNORMAL LOW (ref 3.5–5.0)
Alkaline Phosphatase: 191 U/L — ABNORMAL HIGH (ref 38–126)
Anion gap: 10 (ref 5–15)
BUN: 6 mg/dL (ref 6–20)
CO2: 22 mmol/L (ref 22–32)
Calcium: 8.7 mg/dL — ABNORMAL LOW (ref 8.9–10.3)
Chloride: 102 mmol/L (ref 98–111)
Creatinine, Ser: 0.48 mg/dL (ref 0.44–1.00)
GFR, Estimated: >60 mL/min (ref >60)
Glucose, Bld: 76 mg/dL (ref 70–99)
Potassium: 3.7 mmol/L (ref 3.5–5.1)
Sodium: 134 mmol/L — ABNORMAL LOW (ref 135–145)
Total Bilirubin: 0.5 mg/dL (ref 0.3–1.2)
Total Protein: 6.8 g/dL (ref 6.5–8.1)

## 2023-01-20 LAB — CBC
HCT: 34.7 % — ABNORMAL LOW (ref 36.0–46.0)
Hemoglobin: 11.4 g/dL — ABNORMAL LOW (ref 12.0–15.0)
MCH: 29.1 pg (ref 26.0–34.0)
MCHC: 32.9 g/dL (ref 30.0–36.0)
MCV: 88.5 fL (ref 80.0–100.0)
Platelets: 171 10*3/uL (ref 150–400)
RBC: 3.92 MIL/uL (ref 3.87–5.11)
RDW: 15.2 % (ref 11.5–15.5)
WBC: 10.9 10*3/uL — ABNORMAL HIGH (ref 4.0–10.5)
nRBC: 0 % (ref 0.0–0.2)

## 2023-01-20 LAB — PROTEIN / CREATININE RATIO, URINE
Creatinine, Urine: 50 mg/dL
Protein Creatinine Ratio: 0.16 mg/mg{Cre} — ABNORMAL HIGH (ref 0.00–0.15)
Total Protein, Urine: 8 mg/dL

## 2023-01-20 LAB — WET PREP, GENITAL
Clue Cells Wet Prep HPF POC: NONE SEEN
Sperm: NONE SEEN
Trich, Wet Prep: NONE SEEN
WBC, Wet Prep HPF POC: <10 (ref <10)
Yeast Wet Prep HPF POC: NONE SEEN

## 2023-01-20 LAB — TYPE AND SCREEN

## 2023-01-20 LAB — TROPONIN I (HIGH SENSITIVITY): Troponin I (High Sensitivity): 7 ng/L (ref <18)

## 2023-01-20 MED ORDER — ALUM & MAG HYDROXIDE-SIMETH 200-200-20 MG/5ML PO SUSP
30.0000 mL | Freq: Once | ORAL | Status: AC
  Administered 2023-01-20: 30 mL via ORAL
  Filled 2023-01-20: qty 30

## 2023-01-20 MED ORDER — ACETAMINOPHEN 325 MG PO TABS
650.0000 mg | ORAL_TABLET | ORAL | Status: DC | PRN
  Administered 2023-01-21 – 2023-01-22 (×2): 650 mg via ORAL
  Filled 2023-01-20 (×2): qty 2

## 2023-01-20 MED ORDER — LIDOCAINE VISCOUS HCL 2 % MT SOLN
15.0000 mL | Freq: Once | OROMUCOSAL | Status: AC
  Administered 2023-01-20: 15 mL via ORAL
  Filled 2023-01-20: qty 15

## 2023-01-20 MED ORDER — PRENATAL MULTIVITAMIN CH
1.0000 | ORAL_TABLET | Freq: Every day | ORAL | Status: DC
  Administered 2023-01-21: 1 via ORAL
  Filled 2023-01-20: qty 1

## 2023-01-20 MED ORDER — ASPIRIN 81 MG PO TBEC
162.0000 mg | DELAYED_RELEASE_TABLET | Freq: Every day | ORAL | Status: DC
  Administered 2023-01-21 – 2023-01-22 (×2): 162 mg via ORAL
  Filled 2023-01-20 (×2): qty 2

## 2023-01-20 MED ORDER — LACTATED RINGERS IV BOLUS
1000.0000 mL | Freq: Once | INTRAVENOUS | Status: AC
  Administered 2023-01-21: 1000 mL via INTRAVENOUS

## 2023-01-20 MED ORDER — PANTOPRAZOLE SODIUM 20 MG PO TBEC
20.0000 mg | DELAYED_RELEASE_TABLET | Freq: Every day | ORAL | Status: DC
  Administered 2023-01-21 – 2023-01-22 (×2): 20 mg via ORAL
  Filled 2023-01-20 (×2): qty 1

## 2023-01-20 MED ORDER — LACTATED RINGERS IV BOLUS
1000.0000 mL | Freq: Once | INTRAVENOUS | Status: AC
  Administered 2023-01-20: 1000 mL via INTRAVENOUS

## 2023-01-20 MED ORDER — ALUM & MAG HYDROXIDE-SIMETH 200-200-20 MG/5ML PO SUSP: 30.0000 mL | Freq: Once | ORAL | Status: DC

## 2023-01-20 MED ORDER — OXYCODONE HCL 5 MG PO TABS
5.0000 mg | ORAL_TABLET | Freq: Once | ORAL | Status: AC
  Administered 2023-01-20: 5 mg via ORAL
  Filled 2023-01-20: qty 1

## 2023-01-20 MED ORDER — LACTATED RINGERS IV SOLN: 125.0000 mL/h | INTRAVENOUS | Status: AC

## 2023-01-20 MED ORDER — DOCUSATE SODIUM 100 MG PO CAPS
100.0000 mg | ORAL_CAPSULE | Freq: Every day | ORAL | Status: DC
  Administered 2023-01-21 – 2023-01-22 (×2): 100 mg via ORAL
  Filled 2023-01-20 (×2): qty 1

## 2023-01-20 MED ORDER — CALCIUM CARBONATE ANTACID 500 MG PO CHEW: 2.0000 | CHEWABLE_TABLET | ORAL | Status: DC | PRN

## 2023-01-20 MED ORDER — LIDOCAINE VISCOUS HCL 2 % MT SOLN: 15.0000 mL | Freq: Once | OROMUCOSAL | Status: DC

## 2023-01-20 MED ORDER — ALUM & MAG HYDROXIDE-SIMETH 200-200-20 MG/5ML PO SUSP: 15.0000 mL | Freq: Four times a day (QID) | ORAL | Status: DC | PRN

## 2023-01-20 MED ORDER — BETAMETHASONE SOD PHOS & ACET 6 (3-3) MG/ML IJ SUSP
12.0000 mg | Freq: Once | INTRAMUSCULAR | Status: AC
  Administered 2023-01-20: 12 mg via INTRAMUSCULAR
  Filled 2023-01-20: qty 5

## 2023-01-20 MED ORDER — ONDANSETRON 4 MG PO TBDP: 4.0000 mg | ORAL_TABLET | Freq: Four times a day (QID) | ORAL | Status: DC | PRN

## 2023-01-20 NOTE — MAU Provider Note (Cosign Needed)
History     CSN: [id number]  Arrival date and time: 01/20/23 1719   Event Date/Time   First Provider Initiated Contact with Patient 01/20/23 1826      Chief Complaint  Patient presents with   Abdominal Pain   Ms. Carla Rose is a 27 y.o. year old [id number] female at [redacted]w[redacted]d weeks gestation who presents to MAU reporting upper abdominal pain mostly on the RT side all day. She describes it as constant ans under her RT rib. She also complains of contractions all day. She also reports a yellow mucousy vaginal d/c this morning. She reports (+) FM.    OB History     Gravida  5   Para  3   Term  3   Preterm      AB  1   Living  3      SAB      IAB      Ectopic  1   Multiple  0   Live Births  3           Past Medical History:  Diagnosis Date   Anemia    Chlamydia    Gonorrhea    Hypertension    Pregnancy induced hypertension     Past Surgical History:  Procedure Laterality Date   LAPAROSCOPIC UNILATERAL SALPINGECTOMY Left 01/30/2022   Procedure: LEFT LAPAROSCOPIC SALPINGECTOMY WITH REMOVAL OF ECTOPIC PREGNANCY;  Surgeon: Adam Phenix, MD;  Location: [facility] OR;  Service: Gynecology;  Laterality: Left;   NO PAST SURGERIES      Family History  Problem Relation Age of Onset   Crohn's disease Father    Crohn's disease Sister    Heart disease Paternal Grandmother     Social History   Tobacco Use   Smoking status: Former    Packs/day: 5    Types: Cigars, Cigarettes   Smokeless tobacco: Never  Vaping Use   Vaping Use: Never used  Substance Use Topics   Alcohol use: No   Drug use: No    Allergies: No Known Allergies  Medications Prior to Admission  Medication Sig Dispense Refill Last Dose   aspirin EC 81 MG tablet Take 2 tablets (162 mg total) by mouth daily. Swallow whole. 180 tablet 2 01/20/2023 at 1000   flintstones complete (FLINTSTONES) 60 MG chewable tablet Chew 2 tablets by mouth daily.   01/20/2023 at 1000   Blood Pressure Monitor MISC For  regular home bp monitoring during pregnancy 1 each 0    Doxylamine-Pyridoxine (DICLEGIS) 10-10 MG TBEC Take 2 qhs; may also take one in am and one in afternoon prn nausea 120 tablet 6 Unknown   metroNIDAZOLE (FLAGYL) 500 MG tablet Take 1 tablet (500 mg total) by mouth 2 (two) times daily. (Patient not taking: Reported on 01/13/2023) 14 tablet 0    ondansetron (ZOFRAN-ODT) 4 MG disintegrating tablet Take 1 tablet (4 mg total) by mouth every 6 (six) hours as needed for nausea. 30 tablet 2 Unknown   pantoprazole (PROTONIX) 20 MG tablet Take 1 tablet (20 mg total) by mouth daily. 30 tablet 3 Unknown   penicillin v potassium (VEETID) 500 MG tablet Take 1 tablet (500 mg total) by mouth 4 (four) times daily. X 7 days 28 tablet 0 Unknown   terconazole (TERAZOL 7) 0.4 % vaginal cream Place 1 applicator vaginally at bedtime. (Patient not taking: Reported on 01/13/2023) 45 g 0     Review of Systems  Constitutional: Negative.   HENT: Negative.  Eyes: Negative.   Respiratory: Negative.    Cardiovascular: Negative.   Gastrointestinal:  Positive for abdominal pain (upper RT, under rib pain).  Endocrine: Negative.   Genitourinary:  Positive for vaginal discharge (yellow mucous).  Musculoskeletal: Negative.   Skin: Negative.   Allergic/Immunologic: Negative.   Neurological: Negative.   Hematological: Negative.   Psychiatric/Behavioral: Negative.     Physical Exam  Patient Vitals for the past 24 hrs:  BP Temp Pulse Resp SpO2 Height Weight  01/20/23 2019 133/73 -- (!) 124 (!) 25 -- -- --  01/20/23 2001 131/89 -- (!) 114 -- -- -- --  01/20/23 1957 135/80 -- (!) 122 -- -- -- --  01/20/23 1910 -- -- -- -- 98 % -- --  01/20/23 1747 126/82 -- (!) 124 18 -- -- --  01/20/23 1729 (!) 130/91 (!) 97.5 F (36.4 C) (!) 125 18 -- 5\' 8"  (1.727 m) 98 kg     Physical Exam Vitals and nursing note reviewed.  Constitutional:      Appearance: Normal appearance. She is obese.  Cardiovascular:     Rate and Rhythm:  Tachycardia present.  Pulmonary:     Effort: Pulmonary effort is normal.  Abdominal:     Comments: Upper abdomen firm to palpation  Genitourinary:    Comments: Swabs collected by RN using blind swab technique  Musculoskeletal:        General: Normal range of motion.  Skin:    General: Skin is warm and dry.  Neurological:     Mental Status: She is alert and oriented to person, place, and time.  Psychiatric:        Mood and Affect: Mood normal.        Behavior: Behavior normal.        Thought Content: Thought content normal.        Judgment: Judgment normal.    REACTIVE NST - FHR: 150 bpm / moderate variability / accels present / prolonged decel x 6 mins with gradual recovery to baseline after position change to LT lateral position  IV started and PEC labs drawn / TOCO: regular every 3-6 mins  @ 2020:Prolonged decel #2 x 6 mins, IV clotted off, restarted, position change to LT lateral position   MAU Course  Procedures  MDM Wet Prep GC/CT -- Results pending  OB MFM Limited U/S Betamethasone 12 mg IM  Results for orders placed or performed during the hospital encounter of 01/20/23 (from the past 24 hour(s))  Urinalysis, Routine w reflex microscopic -Urine, Clean Catch     Status: None   Collection Time: 01/20/23  6:11 PM  Result Value Ref Range   Color, Urine YELLOW YELLOW   APPearance CLEAR CLEAR   Specific Gravity, Urine 1.010 1.005 - 1.030   pH 7.0 5.0 - 8.0   Glucose, UA NEGATIVE NEGATIVE mg/dL   Hgb urine dipstick NEGATIVE NEGATIVE   Bilirubin Urine NEGATIVE NEGATIVE   Ketones, ur NEGATIVE NEGATIVE mg/dL   Protein, ur NEGATIVE NEGATIVE mg/dL   Nitrite NEGATIVE NEGATIVE   Leukocytes,Ua NEGATIVE NEGATIVE  CBC     Status: Abnormal   Collection Time: 01/20/23  6:42 PM  Result Value Ref Range   WBC 10.9 (H) 4.0 - 10.5 K/uL   RBC 3.92 3.87 - 5.11 MIL/uL   Hemoglobin 11.4 (L) 12.0 - 15.0 g/dL   HCT 16.1 (L) 09.6 - 04.5 %   MCV 88.5 80.0 - 100.0 fL   MCH 29.1 26.0 -  34.0 pg   MCHC  32.9 30.0 - 36.0 g/dL   RDW 16.1 09.6 - 04.5 %   Platelets 171 150 - 400 K/uL   nRBC 0.0 0.0 - 0.2 %  Comprehensive metabolic panel     Status: Abnormal   Collection Time: 01/20/23  6:42 PM  Result Value Ref Range   Sodium 134 (L) 135 - 145 mmol/L   Potassium 3.7 3.5 - 5.1 mmol/L   Chloride 102 98 - 111 mmol/L   CO2 22 22 - 32 mmol/L   Glucose, Bld 76 70 - 99 mg/dL   BUN 6 6 - 20 mg/dL   Creatinine, Ser 4.09 0.44 - 1.00 mg/dL   Calcium 8.7 (L) 8.9 - 10.3 mg/dL   Total Protein 6.8 6.5 - 8.1 g/dL   Albumin 2.8 (L) 3.5 - 5.0 g/dL   AST 20 15 - 41 U/L   ALT 22 0 - 44 U/L   Alkaline Phosphatase 191 (H) 38 - 126 U/L   Total Bilirubin 0.5 0.3 - 1.2 mg/dL   GFR, Estimated >81 >19 mL/min   Anion gap 10 5 - 15  Wet prep, genital     Status: None   Collection Time: 01/20/23  6:42 PM   Specimen: Vaginal  Result Value Ref Range   Yeast Wet Prep HPF POC NONE SEEN NONE SEEN   Trich, Wet Prep NONE SEEN NONE SEEN   Clue Cells Wet Prep HPF POC NONE SEEN NONE SEEN   WBC, Wet Prep HPF POC <10 <10   Sperm NONE SEEN      Media Information     Report given to and care assumed by Gwenyth Allegra, MD @ [address], CNM 01/20/2023, 6:26 PM  Assessment and Plan

## 2023-01-20 NOTE — MAU Note (Signed)
.  Carla Rose is a 27 y.o. at [redacted]w[redacted]d here in MAU reporting: she has had upper abd pain mostly on the right all day long. Pain is constant sharp pain under her ribs. Also stating she is having ctx as well. Good fetal movement felt. Stated she has had yellow mucusy discharge this morning as well.   Onset of complaint: this morning Pain score: 7 Vitals:   01/20/23 1729  BP: (!) 130/91  Pulse: (!) 125  Resp: 18  Temp: (!) 97.5 F (36.4 C)     FHT:137 Lab orders placed from triage:  u/a

## 2023-01-20 NOTE — H&P (Addendum)
FACULTY PRACTICE ANTEPARTUM ADMISSION HISTORY AND PHYSICAL NOTE   History of Present Illness: Carla Rose is a 27 y.o. [phone] at [redacted]w[redacted]d admitted for observation for fetal heart rate decelerations and epigastric pain.   Pt initially presented with upper abdominal pain and contractions. Reports pain localized to her right side, then worsened to her left side during her time in MAU. Rates pain as 7-8 out of 10. Worse with movement, sitting up and taking deep breaths. Denies nausea, vomiting, fevers or chills. Notes that other family members had similar symptoms this week that resolved after 24 hours. Denies VB, LOF. +FM.  In MAU, she is in no acute distress. Her abdomen is gravid, mildly tender in the epigastrum and over her lower ribs bilaterally. Cervix is closed. She has been tachycardic in the 110-120s and had a mild range BP of 130/91 on arrival but has been normotensive since. Her work up has included the following: Labs: CBC/CMP unremarkable. Mild elevated in alk phos (191). No significant leukocytosis (WBC 10.6). Trop negative. P/C 0.16. UA neg LE/nitrite/blood EKG sinus tachycardia Limited OB US cephalic, no placental abnormalities per verbal report. Normal RUQ ultrasound.  She received oxycodone 5mg  and GI cocktail with minimal improvement in her pain.  She had two prolonged/late decelerations x 6 minutes that resolved with position changes & resolution of contraction. Betamethasone #1 was given at that time. Had additional late decel around 10:30pm. In between these decelerations she has had FHR 150bpm, moderate variability with accelerations. Fetal status is overall reassuring but given these decelerations, will admit for prolonged external fetal monitoring.   Patient Active Problem List   Diagnosis Date Noted   Fetal heart rate decelerations affecting management of mother 01/20/2023   Supervision of normal pregnancy 08/18/2022   History of ectopic pregnancy 08/18/2022   Abnormal  Pap smear of cervix 11/05/2021   History of gestational hypertension & PPHTN 02/25/2018    Past Medical History:  Diagnosis Date   Anemia    Chlamydia    Gonorrhea    Hypertension    Pregnancy induced hypertension     Past Surgical History:  Procedure Laterality Date   LAPAROSCOPIC UNILATERAL SALPINGECTOMY Left 01/30/2022   Procedure: LEFT LAPAROSCOPIC SALPINGECTOMY WITH REMOVAL OF ECTOPIC PREGNANCY;  Surgeon: Adam Phenix, MD;  Location: [facility] OR;  Service: Gynecology;  Laterality: Left;   NO PAST SURGERIES      OB History  Gravida Para Term Preterm AB Living  5 3 3   1 3   SAB IAB Ectopic Multiple Live Births      1 0 3    # Outcome Date GA Lbr Len/2nd Weight Sex Delivery Anes PTL Lv  5 Current           4 Ectopic 01/30/22             Birth Comments: Lt w/ Lt salpingectomy  3 Term 07/19/18 [redacted]w[redacted]d 07:01 / 00:09 3815 g M Vag-Spont EPI  LIV     Birth Comments: PP HTN  2 Term 01/09/17 [redacted]w[redacted]d  3289 g F Vag-Spont EPI N LIV     Complications: Gestational hypertension  1 Term 01/17/14 [redacted]w[redacted]d  3260 g M Vag-Spont EPI N LIV    Social History   Socioeconomic History   Marital status: Single    Spouse name: Not on file   Number of children: 3   Years of education: Not on file   Highest education level: Not on file  Occupational History   Not on file  Tobacco Use   Smoking status: Former    Packs/day: 5    Types: Cigars, Cigarettes   Smokeless tobacco: Never  Vaping Use   Vaping Use: Never used  Substance and Sexual Activity   Alcohol use: No   Drug use: No   Sexual activity: Yes    Birth control/protection: None  Other Topics Concern   Not on file  Social History Narrative   Not on file   Social Determinants of Health   Financial Resource Strain: Medium Risk (08/18/2022)   Overall Financial Resource Strain (CARDIA)    Difficulty of Paying Living Expenses: Somewhat hard  Food Insecurity: No Food Insecurity (08/18/2022)   Hunger Vital Sign    Worried About Running  Out of Food in the Last Year: Never true    Ran Out of Food in the Last Year: Never true  Transportation Needs: No Transportation Needs (08/18/2022)   PRAPARE - Administrator, Civil Service (Medical): No    Lack of Transportation (Non-Medical): No  Physical Activity: Inactive (08/18/2022)   Exercise Vital Sign    Days of Exercise per Week: 0 days    Minutes of Exercise per Session: 0 min  Stress: Stress Concern Present (08/18/2022)   [employer] of Occupational Health - Occupational Stress Questionnaire    Feeling of Stress : Rather much  Social Connections: Socially Isolated (08/18/2022)   Social Connection and Isolation Panel [NHANES]    Frequency of Communication with Friends and Family: Never    Frequency of Social Gatherings with Friends and Family: Never    Attends Religious Services: Never    Database administrator or Organizations: No    Attends Engineer, structural: Never    Marital Status: Living with partner    Family History  Problem Relation Age of Onset   Crohn's disease Father    Crohn's disease Sister    Heart disease Paternal Grandmother     No Known Allergies  Medications Prior to Admission  Medication Sig Dispense Refill Last Dose   aspirin EC 81 MG tablet Take 2 tablets (162 mg total) by mouth daily. Swallow whole. 180 tablet 2 01/20/2023 at 1000   flintstones complete (FLINTSTONES) 60 MG chewable tablet Chew 2 tablets by mouth daily.   01/20/2023 at 1000   ondansetron (ZOFRAN-ODT) 4 MG disintegrating tablet Take 1 tablet (4 mg total) by mouth every 6 (six) hours as needed for nausea. 30 tablet 2    pantoprazole (PROTONIX) 20 MG tablet Take 1 tablet (20 mg total) by mouth daily. 30 tablet 3    Blood Pressure Monitor MISC For regular home bp monitoring during pregnancy 1 each 0    Doxylamine-Pyridoxine (DICLEGIS) 10-10 MG TBEC Take 2 qhs; may also take one in am and one in afternoon prn nausea 120 tablet 6 Unknown   penicillin v  potassium (VEETID) 500 MG tablet Take 1 tablet (500 mg total) by mouth 4 (four) times daily. X 7 days 28 tablet 0 Unknown    Review of Systems -  See HPI  Vitals:  BP 133/73   Pulse (!) 124   Temp 98 F (36.7 C) (Oral)   Resp (!) 25   Ht 5\' 8"  (1.727 m)   Wt 98 kg   LMP 05/28/2022 (Approximate)   SpO2 100%   BMI 32.84 kg/m  Physical Examination: CONSTITUTIONAL: Well-developed, well-nourished female in no acute distress. Marland Kitchen CARDIOVASCULAR: Sinus tachycardia RESPIRATORY: Effort and breath sounds normal, no problems with respiration noted  ABDOMEN: Soft, nontender, nondistended, gravid. MUSCULOSKELETAL: Normal range of motion. No edema and no tenderness. 2+ distal pulses.  Cervix: closed per prior examiner. fetal presentation is cephalic. Membranes:intact Fetal Monitoring:Baseline: 150 bpm, Variability: Good {> 6 bpm), Accelerations: Reactive, and Decelerations: 2 prolonged x 6 min, then 2 additional lates (<50% of contractions) Tocometer: q6-85min, mild  Labs:  Results for orders placed or performed during the hospital encounter of 01/20/23 (from the past 24 hour(s))  Urinalysis, Routine w reflex microscopic -Urine, Clean Catch   Collection Time: 01/20/23  6:11 PM  Result Value Ref Range   Color, Urine YELLOW YELLOW   APPearance CLEAR CLEAR   Specific Gravity, Urine 1.010 1.005 - 1.030   pH 7.0 5.0 - 8.0   Glucose, UA NEGATIVE NEGATIVE mg/dL   Hgb urine dipstick NEGATIVE NEGATIVE   Bilirubin Urine NEGATIVE NEGATIVE   Ketones, ur NEGATIVE NEGATIVE mg/dL   Protein, ur NEGATIVE NEGATIVE mg/dL   Nitrite NEGATIVE NEGATIVE   Leukocytes,Ua NEGATIVE NEGATIVE  Protein / creatinine ratio, urine   Collection Time: 01/20/23  6:11 PM  Result Value Ref Range   Creatinine, Urine 50 mg/dL   Total Protein, Urine 8 mg/dL   Protein Creatinine Ratio 0.16 (H) 0.00 - 0.15 mg/mg[Cre]  Wet prep, genital   Collection Time: 01/20/23  6:42 PM   Specimen: Vaginal  Result Value Ref Range   Yeast  Wet Prep HPF POC NONE SEEN NONE SEEN   Trich, Wet Prep NONE SEEN NONE SEEN   Clue Cells Wet Prep HPF POC NONE SEEN NONE SEEN   WBC, Wet Prep HPF POC <10 <10   Sperm NONE SEEN   CBC   Collection Time: 01/20/23  6:42 PM  Result Value Ref Range   WBC 10.9 (H) 4.0 - 10.5 K/uL   RBC 3.92 3.87 - 5.11 MIL/uL   Hemoglobin 11.4 (L) 12.0 - 15.0 g/dL   HCT 21.3 (L) 08.6 - 57.8 %   MCV 88.5 80.0 - 100.0 fL   MCH 29.1 26.0 - 34.0 pg   MCHC 32.9 30.0 - 36.0 g/dL   RDW 46.9 62.9 - 52.8 %   Platelets 171 150 - 400 K/uL   nRBC 0.0 0.0 - 0.2 %  Comprehensive metabolic panel   Collection Time: 01/20/23  6:42 PM  Result Value Ref Range   Sodium 134 (L) 135 - 145 mmol/L   Potassium 3.7 3.5 - 5.1 mmol/L   Chloride 102 98 - 111 mmol/L   CO2 22 22 - 32 mmol/L   Glucose, Bld 76 70 - 99 mg/dL   BUN 6 6 - 20 mg/dL   Creatinine, Ser 4.13 0.44 - 1.00 mg/dL   Calcium 8.7 (L) 8.9 - 10.3 mg/dL   Total Protein 6.8 6.5 - 8.1 g/dL   Albumin 2.8 (L) 3.5 - 5.0 g/dL   AST 20 15 - 41 U/L   ALT 22 0 - 44 U/L   Alkaline Phosphatase 191 (H) 38 - 126 U/L   Total Bilirubin 0.5 0.3 - 1.2 mg/dL   GFR, Estimated >24 >40 mL/min   Anion gap 10 5 - 15  Troponin I (High Sensitivity)   Collection Time: 01/20/23  6:42 PM  Result Value Ref Range   Troponin I (High Sensitivity) 7 <18 ng/L    Imaging Studies: US Abdomen Limited RUQ (LIVER/GB)  Result Date: 01/20/2023 CLINICAL DATA:  Upper abdominal pain. EXAM: ULTRASOUND ABDOMEN LIMITED RIGHT UPPER QUADRANT COMPARISON:  CT abdomen pelvis dated 02/21/2019. FINDINGS: Gallbladder: No gallstones or wall  thickening visualized. No sonographic Murphy sign noted by sonographer. Common bile duct: Diameter: 2 mm Liver: No focal lesion identified. Within normal limits in parenchymal echogenicity. Portal vein is patent on color Doppler imaging with normal direction of blood flow towards the liver. Other: None. IMPRESSION: Unremarkable right upper quadrant ultrasound. Electronically  Signed   By: Elgie Collard M.D.   On: 01/20/2023 21:44     Assessment: Patient Active Problem List   Diagnosis Date Noted   Fetal heart rate decelerations affecting management of mother 01/20/2023   Supervision of normal pregnancy 08/18/2022   History of ectopic pregnancy 08/18/2022   Abnormal Pap smear of cervix 11/05/2021   History of gestational hypertension & PPHTN 02/25/2018   Unclear etiology of patient's symptoms and FHR decelerations. Suspect possible MSK vs GI component of pain. Pt is tachycardic, but on review of her prenatal appointments her HR has consistently been 110-120s since April. No evidence of cholelithiasis/cholecystitis, appendicitis, PUD, nephrolithiasis, PTL, pancreatitis, ACS at this time. Overall low concern for abruption given localization of pain, lack of bleeding and infrequent contractions, but given FHR decelerations will obtain coags and admit patient to [facility] for continuous external fetal monitoring overnight.   Plan: - Admit to antenatal for observation - Continuous EFM overnight - S/p BMZ#1 6/25, 2nd dose ordered - Tylenol prn pain   Harvie Bridge, MD Obstetrician & Gynecologist, Faculty Practice Faculty Practice, [facility]

## 2023-01-20 NOTE — H&P (Incomplete)
FACULTY PRACTICE ANTEPARTUM ADMISSION HISTORY AND PHYSICAL NOTE   History of Present Illness: Carla Rose is a 27 y.o. [phone] at [redacted]w[redacted]d admitted for observation for fetal heart rate decelerations and epigastric pain.   Pt initially presented with upper abdominal pain and contractions. Reports pain localized to her right side, then worsened to her left side during her time in MAU. Rates pain as 7-8 out of 10. Worse with movement, sitting up and taking deep breaths. Denies nausea, vomiting, fevers or chills. Notes that other family members had similar symptoms this week that resolved after 24 hours. Denies VB, LOF. +FM.  In MAU, she is in no acute distress. Her abdomen is gravid, mildly tender in the epigastrum and over her lower ribs bilaterally. Cervix is closed. She has been tachycardic in the 110-120s and had a mild range BP of 130/91 on arrival but has been normotensive since. Her work up has included the following: Labs: CBC/CMP unremarkable. Mild elevated in alk phos (191). No significant leukocytosis (WBC 10.6). Trop negative. P/C 0.16. UA neg LE/nitrite/blood EKG sinus tachycardia Limited OB US cephalic, no placental abnormalities per verbal report. Normal RUQ ultrasound.  She received oxycodone 5mg  and GI cocktail with minimal improvement in her pain.  She had two prolonged/late decelerations x 6 minutes that resolved with position changes & resolution of contraction. Betamethasone #1 was given at that time. Had additional late decel around 10:30pm. In between these decelerations she has had FHR 150bpm, moderate variability with accelerations. Fetal status is overall reassuring but given these decelerations, will admit for prolonged external fetal monitoring.   Patient Active Problem List   Diagnosis Date Noted  . Fetal heart rate decelerations affecting management of mother 01/20/2023  . Supervision of normal pregnancy 08/18/2022  . History of ectopic pregnancy 08/18/2022  .  Abnormal Pap smear of cervix 11/05/2021  . History of gestational hypertension & PPHTN 02/25/2018    Past Medical History:  Diagnosis Date  . Anemia   . Chlamydia   . Gonorrhea   . Hypertension   . Pregnancy induced hypertension     Past Surgical History:  Procedure Laterality Date  . LAPAROSCOPIC UNILATERAL SALPINGECTOMY Left 01/30/2022   Procedure: LEFT LAPAROSCOPIC SALPINGECTOMY WITH REMOVAL OF ECTOPIC PREGNANCY;  Surgeon: Adam Phenix, MD;  Location: [facility] OR;  Service: Gynecology;  Laterality: Left;  . NO PAST SURGERIES      OB History  Gravida Para Term Preterm AB Living  5 3 3   1 3   SAB IAB Ectopic Multiple Live Births      1 0 3    # Outcome Date GA Lbr Len/2nd Weight Sex Delivery Anes PTL Lv  5 Current           4 Ectopic 01/30/22             Birth Comments: Lt w/ Lt salpingectomy  3 Term 07/19/18 [redacted]w[redacted]d 07:01 / 00:09 3815 g M Vag-Spont EPI  LIV     Birth Comments: PP HTN  2 Term 01/09/17 [redacted]w[redacted]d  3289 g F Vag-Spont EPI N LIV     Complications: Gestational hypertension  1 Term 01/17/14 [redacted]w[redacted]d  3260 g M Vag-Spont EPI N LIV    Social History   Socioeconomic History  . Marital status: Single    Spouse name: Not on file  . Number of children: 3  . Years of education: Not on file  . Highest education level: Not on file  Occupational History  . Not on file  Tobacco Use  . Smoking status: Former    Packs/day: 5    Types: Cigars, Cigarettes  . Smokeless tobacco: Never  Vaping Use  . Vaping Use: Never used  Substance and Sexual Activity  . Alcohol use: No  . Drug use: No  . Sexual activity: Yes    Birth control/protection: None  Other Topics Concern  . Not on file  Social History Narrative  . Not on file   Social Determinants of Health   Financial Resource Strain: Medium Risk (08/18/2022)   Overall Financial Resource Strain (CARDIA)   . Difficulty of Paying Living Expenses: Somewhat hard  Food Insecurity: No Food Insecurity (08/18/2022)   Hunger Vital  Sign   . Worried About Programme researcher, broadcasting/film/video in the Last Year: Never true   . Ran Out of Food in the Last Year: Never true  Transportation Needs: No Transportation Needs (08/18/2022)   PRAPARE - Transportation   . Lack of Transportation (Medical): No   . Lack of Transportation (Non-Medical): No  Physical Activity: Inactive (08/18/2022)   Exercise Vital Sign   . Days of Exercise per Week: 0 days   . Minutes of Exercise per Session: 0 min  Stress: Stress Concern Present (08/18/2022)   [employer] of Occupational Health - Occupational Stress Questionnaire   . Feeling of Stress : Rather much  Social Connections: Socially Isolated (08/18/2022)   Social Connection and Isolation Panel [NHANES]   . Frequency of Communication with Friends and Family: Never   . Frequency of Social Gatherings with Friends and Family: Never   . Attends Religious Services: Never   . Active Member of Clubs or Organizations: No   . Attends Banker Meetings: Never   . Marital Status: Living with partner    Family History  Problem Relation Age of Onset  . Crohn's disease Father   . Crohn's disease Sister   . Heart disease Paternal Grandmother     No Known Allergies  Medications Prior to Admission  Medication Sig Dispense Refill Last Dose  . aspirin EC 81 MG tablet Take 2 tablets (162 mg total) by mouth daily. Swallow whole. 180 tablet 2 01/20/2023 at 1000  . flintstones complete (FLINTSTONES) 60 MG chewable tablet Chew 2 tablets by mouth daily.   01/20/2023 at 1000  . ondansetron (ZOFRAN-ODT) 4 MG disintegrating tablet Take 1 tablet (4 mg total) by mouth every 6 (six) hours as needed for nausea. 30 tablet 2   . pantoprazole (PROTONIX) 20 MG tablet Take 1 tablet (20 mg total) by mouth daily. 30 tablet 3   . Blood Pressure Monitor MISC For regular home bp monitoring during pregnancy 1 each 0   . Doxylamine-Pyridoxine (DICLEGIS) 10-10 MG TBEC Take 2 qhs; may also take one in am and one in afternoon prn  nausea 120 tablet 6 Unknown  . penicillin v potassium (VEETID) 500 MG tablet Take 1 tablet (500 mg total) by mouth 4 (four) times daily. X 7 days 28 tablet 0 Unknown    Review of Systems -  See HPI  Vitals:  BP 133/73   Pulse (!) 124   Temp 98 F (36.7 C) (Oral)   Resp (!) 25   Ht 5\' 8"  (1.727 m)   Wt 98 kg   LMP 05/28/2022 (Approximate)   SpO2 100%   BMI 32.84 kg/m  Physical Examination: CONSTITUTIONAL: Well-developed, well-nourished female in no acute distress. Marland Kitchen CARDIOVASCULAR: Sinus tachycardia RESPIRATORY: Effort and breath sounds normal, no problems with respiration noted  ABDOMEN: Soft, nontender, nondistended, gravid. MUSCULOSKELETAL: Normal range of motion. No edema and no tenderness. 2+ distal pulses.  Cervix: closed per prior examiner. fetal presentation is cephalic. Membranes:intact Fetal Monitoring:Baseline: 150 bpm, Variability: Good {> 6 bpm), Accelerations: Reactive, and Decelerations: 2 prolonged x 6 min, then 2 additional lates (<50% of contractions) Tocometer: q6-67min, mild  Labs:  Results for orders placed or performed during the hospital encounter of 01/20/23 (from the past 24 hour(s))  Urinalysis, Routine w reflex microscopic -Urine, Clean Catch   Collection Time: 01/20/23  6:11 PM  Result Value Ref Range   Color, Urine YELLOW YELLOW   APPearance CLEAR CLEAR   Specific Gravity, Urine 1.010 1.005 - 1.030   pH 7.0 5.0 - 8.0   Glucose, UA NEGATIVE NEGATIVE mg/dL   Hgb urine dipstick NEGATIVE NEGATIVE   Bilirubin Urine NEGATIVE NEGATIVE   Ketones, ur NEGATIVE NEGATIVE mg/dL   Protein, ur NEGATIVE NEGATIVE mg/dL   Nitrite NEGATIVE NEGATIVE   Leukocytes,Ua NEGATIVE NEGATIVE  Protein / creatinine ratio, urine   Collection Time: 01/20/23  6:11 PM  Result Value Ref Range   Creatinine, Urine 50 mg/dL   Total Protein, Urine 8 mg/dL   Protein Creatinine Ratio 0.16 (H) 0.00 - 0.15 mg/mg[Cre]  Wet prep, genital   Collection Time: 01/20/23  6:42 PM    Specimen: Vaginal  Result Value Ref Range   Yeast Wet Prep HPF POC NONE SEEN NONE SEEN   Trich, Wet Prep NONE SEEN NONE SEEN   Clue Cells Wet Prep HPF POC NONE SEEN NONE SEEN   WBC, Wet Prep HPF POC <10 <10   Sperm NONE SEEN   CBC   Collection Time: 01/20/23  6:42 PM  Result Value Ref Range   WBC 10.9 (H) 4.0 - 10.5 K/uL   RBC 3.92 3.87 - 5.11 MIL/uL   Hemoglobin 11.4 (L) 12.0 - 15.0 g/dL   HCT 16.1 (L) 09.6 - 04.5 %   MCV 88.5 80.0 - 100.0 fL   MCH 29.1 26.0 - 34.0 pg   MCHC 32.9 30.0 - 36.0 g/dL   RDW 40.9 81.1 - 91.4 %   Platelets 171 150 - 400 K/uL   nRBC 0.0 0.0 - 0.2 %  Comprehensive metabolic panel   Collection Time: 01/20/23  6:42 PM  Result Value Ref Range   Sodium 134 (L) 135 - 145 mmol/L   Potassium 3.7 3.5 - 5.1 mmol/L   Chloride 102 98 - 111 mmol/L   CO2 22 22 - 32 mmol/L   Glucose, Bld 76 70 - 99 mg/dL   BUN 6 6 - 20 mg/dL   Creatinine, Ser 7.82 0.44 - 1.00 mg/dL   Calcium 8.7 (L) 8.9 - 10.3 mg/dL   Total Protein 6.8 6.5 - 8.1 g/dL   Albumin 2.8 (L) 3.5 - 5.0 g/dL   AST 20 15 - 41 U/L   ALT 22 0 - 44 U/L   Alkaline Phosphatase 191 (H) 38 - 126 U/L   Total Bilirubin 0.5 0.3 - 1.2 mg/dL   GFR, Estimated >95 >62 mL/min   Anion gap 10 5 - 15  Troponin I (High Sensitivity)   Collection Time: 01/20/23  6:42 PM  Result Value Ref Range   Troponin I (High Sensitivity) 7 <18 ng/L    Imaging Studies: US Abdomen Limited RUQ (LIVER/GB)  Result Date: 01/20/2023 CLINICAL DATA:  Upper abdominal pain. EXAM: ULTRASOUND ABDOMEN LIMITED RIGHT UPPER QUADRANT COMPARISON:  CT abdomen pelvis dated 02/21/2019. FINDINGS: Gallbladder: No gallstones or wall  thickening visualized. No sonographic Murphy sign noted by sonographer. Common bile duct: Diameter: 2 mm Liver: No focal lesion identified. Within normal limits in parenchymal echogenicity. Portal vein is patent on color Doppler imaging with normal direction of blood flow towards the liver. Other: None. IMPRESSION: Unremarkable  right upper quadrant ultrasound. Electronically Signed   By: Elgie Collard M.D.   On: 01/20/2023 21:44     Assessment: Patient Active Problem List   Diagnosis Date Noted  . Fetal heart rate decelerations affecting management of mother 01/20/2023  . Supervision of normal pregnancy 08/18/2022  . History of ectopic pregnancy 08/18/2022  . Abnormal Pap smear of cervix 11/05/2021  . History of gestational hypertension & PPHTN 02/25/2018   Unclear etiology of patient's symptoms and FHR decelerations. Suspect possible MSK vs GI component of pain. No evidence of cholelithiasis/cholecystitis, appendicitis, PUD, nephrolithiasis, PTL, pancreatitis, ACS at this time. Overall low concern for abruption given localization of pain, but given FHR decelerations will obtain coags and admit patient to [facility] for continuous external fetal monitoring overnight.   Plan: - Admit to antenatal - Continuous EFM overnight - S/p BMZ#1 6/25, 2nd dose ordered  Betamethasone x 2 doses Magnesium sulfate for CP prophylaxis Will recheck presentation if she does progress in preterm labor to determine route of delivery Routine antenatal care  Harvie Bridge, MD Obstetrician & Gynecologist, Faculty Practice Faculty Practice, [facility] - [facility]

## 2023-01-21 ENCOUNTER — Observation Stay (HOSPITAL_COMMUNITY): Payer: BC Managed Care – PPO

## 2023-01-21 ENCOUNTER — Observation Stay (HOSPITAL_BASED_OUTPATIENT_CLINIC_OR_DEPARTMENT_OTHER): Payer: BC Managed Care – PPO

## 2023-01-21 ENCOUNTER — Other Ambulatory Visit: Payer: Self-pay

## 2023-01-21 DIAGNOSIS — Z3A33 33 weeks gestation of pregnancy: Secondary | ICD-10-CM

## 2023-01-21 DIAGNOSIS — O99891 Other specified diseases and conditions complicating pregnancy: Secondary | ICD-10-CM

## 2023-01-21 DIAGNOSIS — O368330 Maternal care for abnormalities of the fetal heart rate or rhythm, third trimester, not applicable or unspecified: Secondary | ICD-10-CM | POA: Diagnosis not present

## 2023-01-21 DIAGNOSIS — R109 Unspecified abdominal pain: Secondary | ICD-10-CM | POA: Diagnosis not present

## 2023-01-21 DIAGNOSIS — Z7982 Long term (current) use of aspirin: Secondary | ICD-10-CM | POA: Diagnosis not present

## 2023-01-21 DIAGNOSIS — R1013 Epigastric pain: Secondary | ICD-10-CM | POA: Diagnosis present

## 2023-01-21 DIAGNOSIS — O09293 Supervision of pregnancy with other poor reproductive or obstetric history, third trimester: Secondary | ICD-10-CM

## 2023-01-21 DIAGNOSIS — R000 Tachycardia, unspecified: Secondary | ICD-10-CM | POA: Diagnosis not present

## 2023-01-21 DIAGNOSIS — Z3A34 34 weeks gestation of pregnancy: Secondary | ICD-10-CM | POA: Diagnosis not present

## 2023-01-21 DIAGNOSIS — Z87891 Personal history of nicotine dependence: Secondary | ICD-10-CM | POA: Diagnosis not present

## 2023-01-21 DIAGNOSIS — Z5986 Financial insecurity: Secondary | ICD-10-CM | POA: Diagnosis not present

## 2023-01-21 DIAGNOSIS — E669 Obesity, unspecified: Secondary | ICD-10-CM

## 2023-01-21 DIAGNOSIS — O99213 Obesity complicating pregnancy, third trimester: Secondary | ICD-10-CM

## 2023-01-21 LAB — TROPONIN I (HIGH SENSITIVITY): Troponin I (High Sensitivity): <2 ng/L (ref <18)

## 2023-01-21 LAB — BRAIN NATRIURETIC PEPTIDE: B Natriuretic Peptide: 79 pg/mL (ref 0.0–100.0)

## 2023-01-21 LAB — PROTIME-INR
INR: 1.1 (ref 0.8–1.2)
Prothrombin Time: 14 seconds (ref 11.4–15.2)

## 2023-01-21 LAB — GC/CHLAMYDIA PROBE AMP (~~LOC~~) NOT AT ARMC
Chlamydia: NEGATIVE
Comment: NEGATIVE
Comment: NORMAL
Neisseria Gonorrhea: NEGATIVE

## 2023-01-21 LAB — TYPE AND SCREEN
ABO/RH(D): O POS
Antibody Screen: NEGATIVE

## 2023-01-21 LAB — FIBRINOGEN: Fibrinogen: 706 mg/dL — ABNORMAL HIGH (ref 210–475)

## 2023-01-21 LAB — APTT: aPTT: 31 seconds (ref 24–36)

## 2023-01-21 MED ORDER — IOHEXOL 350 MG/ML SOLN
75.0000 mL | Freq: Once | INTRAVENOUS | Status: AC | PRN
  Administered 2023-01-21: 75 mL via INTRAVENOUS

## 2023-01-21 MED ORDER — BETAMETHASONE SOD PHOS & ACET 6 (3-3) MG/ML IJ SUSP
12.0000 mg | Freq: Once | INTRAMUSCULAR | Status: AC
  Administered 2023-01-21: 12 mg via INTRAMUSCULAR
  Filled 2023-01-21: qty 5

## 2023-01-21 NOTE — Progress Notes (Signed)
Patient ID: Carla Rose, female   DOB: 1996-01-30, 27 y.o.   MRN: [id number] FACULTY PRACTICE ANTEPARTUM(COMPREHENSIVE) NOTE  Carla Rose is a 27 y.o. G5P3013 at [redacted]w[redacted]d by LMP who is admitted for fetal heart rate decelerations and epigastric pain.    Fetal presentation is cephalic. Length of Stay:  0  Days  Subjective: Increased upper quadrant pain and SOB when she reclines Patient reports the fetal movement as active. Patient reports uterine contraction  activity as rare. Patient reports  vaginal bleeding as none. Patient describes fluid per vagina as None.  Vitals:  Blood pressure 128/78, pulse (!) 122, temperature 98.1 F (36.7 C), temperature source Oral, resp. rate 20, height 5\' 8"  (1.727 m), weight 98 kg, last menstrual period 05/28/2022, SpO2 96 %. Physical Examination:  General appearance - alert, well appearing, and in no distress Heart - normal rate and regular rhythm Abdomen - soft, nontender, nondistended Fundal Height:  size equals dates Cervical Exam: Not evaluated.. Extremities: extremities normal, atraumatic, no cyanosis or edema and Homans sign is negative, no sign of DVT  Membranes:intact  Fetal Monitoring:   Fetal Heart Rate A   Mode External filed at 01/21/2023 1200  Baseline Rate (A) 120 bpm filed at 01/21/2023 1200  Variability 6-25 BPM filed at 01/21/2023 1200  Accelerations 15 x 15 filed at 01/21/2023 1200  Decelerations None filed at 01/21/2023 1200    Labs:  Results for orders placed or performed during the hospital encounter of 01/20/23 (from the past 24 hour(s))  Urinalysis, Routine w reflex microscopic -Urine, Clean Catch   Collection Time: 01/20/23  6:11 PM  Result Value Ref Range   Color, Urine YELLOW YELLOW   APPearance CLEAR CLEAR   Specific Gravity, Urine 1.010 1.005 - 1.030   pH 7.0 5.0 - 8.0   Glucose, UA NEGATIVE NEGATIVE mg/dL   Hgb urine dipstick NEGATIVE NEGATIVE   Bilirubin Urine NEGATIVE NEGATIVE   Ketones, ur NEGATIVE  NEGATIVE mg/dL   Protein, ur NEGATIVE NEGATIVE mg/dL   Nitrite NEGATIVE NEGATIVE   Leukocytes,Ua NEGATIVE NEGATIVE  Protein / creatinine ratio, urine   Collection Time: 01/20/23  6:11 PM  Result Value Ref Range   Creatinine, Urine 50 mg/dL   Total Protein, Urine 8 mg/dL   Protein Creatinine Ratio 0.16 (H) 0.00 - 0.15 mg/mg[Cre]  Type and screen [facility]   Collection Time: 01/20/23  6:41 PM  Result Value Ref Range   ABO/RH(D) O POS    Antibody Screen NEG    Sample Expiration      01/23/2023,2359 Performed at [facility], [address], [place], [place] [address]   Wet prep, genital   Collection Time: 01/20/23  6:42 PM   Specimen: Vaginal  Result Value Ref Range   Yeast Wet Prep HPF POC NONE SEEN NONE SEEN   Trich, Wet Prep NONE SEEN NONE SEEN   Clue Cells Wet Prep HPF POC NONE SEEN NONE SEEN   WBC, Wet Prep HPF POC <10 <10   Sperm NONE SEEN   CBC   Collection Time: 01/20/23  6:42 PM  Result Value Ref Range   WBC 10.9 (H) 4.0 - 10.5 K/uL   RBC 3.92 3.87 - 5.11 MIL/uL   Hemoglobin 11.4 (L) 12.0 - 15.0 g/dL   HCT 95.2 (L) 84.1 - 32.4 %   MCV 88.5 80.0 - 100.0 fL   MCH 29.1 26.0 - 34.0 pg   MCHC 32.9 30.0 - 36.0 g/dL   RDW 40.1 02.7 -  15.5 %   Platelets 171 150 - 400 K/uL   nRBC 0.0 0.0 - 0.2 %  Comprehensive metabolic panel   Collection Time: 01/20/23  6:42 PM  Result Value Ref Range   Sodium 134 (L) 135 - 145 mmol/L   Potassium 3.7 3.5 - 5.1 mmol/L   Chloride 102 98 - 111 mmol/L   CO2 22 22 - 32 mmol/L   Glucose, Bld 76 70 - 99 mg/dL   BUN 6 6 - 20 mg/dL   Creatinine, Ser 0.98 0.44 - 1.00 mg/dL   Calcium 8.7 (L) 8.9 - 10.3 mg/dL   Total Protein 6.8 6.5 - 8.1 g/dL   Albumin 2.8 (L) 3.5 - 5.0 g/dL   AST 20 15 - 41 U/L   ALT 22 0 - 44 U/L   Alkaline Phosphatase 191 (H) 38 - 126 U/L   Total Bilirubin 0.5 0.3 - 1.2 mg/dL   GFR, Estimated >11 >91 mL/min   Anion gap 10 5 - 15  Troponin I (High Sensitivity)   Collection Time: 01/20/23   6:42 PM  Result Value Ref Range   Troponin I (High Sensitivity) 7 <18 ng/L  Troponin I (High Sensitivity)   Collection Time: 01/20/23 11:42 PM  Result Value Ref Range   Troponin I (High Sensitivity) <2 <18 ng/L  Protime-INR   Collection Time: 01/21/23  3:12 AM  Result Value Ref Range   Prothrombin Time 14.0 11.4 - 15.2 seconds   INR 1.1 0.8 - 1.2  APTT   Collection Time: 01/21/23  3:12 AM  Result Value Ref Range   aPTT 31 24 - 36 seconds  Fibrinogen   Collection Time: 01/21/23  3:12 AM  Result Value Ref Range   Fibrinogen 706 (H) 210 - 475 mg/dL   Narrative & Impression  CLINICAL DATA:  27 year old female with history of tachycardia, shortness of breath and left upper quadrant abdominal pain.   EXAM: CT ANGIOGRAPHY CHEST WITH CONTRAST   TECHNIQUE: Multidetector CT imaging of the chest was performed using the standard protocol during bolus administration of intravenous contrast. Multiplanar CT image reconstructions and MIPs were obtained to evaluate the vascular anatomy.   RADIATION DOSE REDUCTION: This exam was performed according to the departmental dose-optimization program which includes automated exposure control, adjustment of the mA and/or kV according to patient size and/or use of iterative reconstruction technique.   CONTRAST:  75mL OMNIPAQUE IOHEXOL 350 MG/ML SOLN   COMPARISON:  No priors.   FINDINGS: Cardiovascular: No filling defects are noted within the pulmonary arterial tree to suggest pulmonary embolism. Heart size is normal. There is no significant pericardial fluid, thickening or pericardial calcification. Dilatation of the pulmonic trunk (3.7 cm in diameter), could suggest pulmonary arterial hypertension. No atherosclerotic calcifications are noted in the thoracic aorta or the coronary arteries.   Mediastinum/Nodes: No pathologically enlarged mediastinal or hilar lymph nodes. Esophagus is unremarkable in appearance. No  axillary lymphadenopathy.   Lungs/Pleura: Minimal subsegmental atelectasis noted in the lung bases (right-greater-than-left). No acute consolidative airspace disease. No pleural effusions. No suspicious appearing pulmonary nodules or masses are noted.   Upper Abdomen: Unremarkable.   Musculoskeletal: There are no aggressive appearing lytic or blastic lesions noted in the visualized portions of the skeleton.   Review of the MIP images confirms the above findings.   IMPRESSION: 1. No acute findings to account for the patient's symptoms. Specifically, no evidence of pulmonary embolism. 2. Mild subsegmental atelectasis in the lung bases bilaterally. 3. Mild dilatation of the pulmonic trunk (  3.7 cm in diameter, which could suggest pulmonary arterial hypertension.     Electronically Signed   By: Trudie Reed M.D.   On: 01/21/2023 11:51    Medications:  Scheduled  aspirin EC  162 mg Oral Daily   betamethasone acetate-betamethasone sodium phosphate  12 mg Intramuscular Once   docusate sodium  100 mg Oral Daily   pantoprazole  20 mg Oral Daily   prenatal multivitamin  1 tablet Oral Q1200   I have reviewed the patient's current medications.  ASSESSMENT: Patient Active Problem List   Diagnosis Date Noted   Fetal heart rate decelerations affecting management of mother 01/20/2023   Supervision of normal pregnancy 08/18/2022   History of ectopic pregnancy 08/18/2022   Abnormal Pap smear of cervix 11/05/2021   History of gestational hypertension & PPHTN 02/25/2018    PLAN: BNP to evaluate for risk of heart failure although EKG and imaging were nl.  Scheryl Darter 01/21/2023,1:51 PM

## 2023-01-22 DIAGNOSIS — R1013 Epigastric pain: Secondary | ICD-10-CM | POA: Diagnosis not present

## 2023-01-22 DIAGNOSIS — Z7982 Long term (current) use of aspirin: Secondary | ICD-10-CM | POA: Diagnosis not present

## 2023-01-22 DIAGNOSIS — O368330 Maternal care for abnormalities of the fetal heart rate or rhythm, third trimester, not applicable or unspecified: Secondary | ICD-10-CM | POA: Diagnosis not present

## 2023-01-22 DIAGNOSIS — Z3A34 34 weeks gestation of pregnancy: Secondary | ICD-10-CM

## 2023-01-22 DIAGNOSIS — Z87891 Personal history of nicotine dependence: Secondary | ICD-10-CM | POA: Diagnosis not present

## 2023-01-22 DIAGNOSIS — Z5986 Financial insecurity: Secondary | ICD-10-CM | POA: Diagnosis not present

## 2023-01-22 DIAGNOSIS — O99891 Other specified diseases and conditions complicating pregnancy: Secondary | ICD-10-CM | POA: Diagnosis not present

## 2023-01-22 DIAGNOSIS — Z3A33 33 weeks gestation of pregnancy: Secondary | ICD-10-CM | POA: Diagnosis not present

## 2023-01-22 DIAGNOSIS — R000 Tachycardia, unspecified: Secondary | ICD-10-CM | POA: Diagnosis not present

## 2023-01-22 NOTE — Discharge Summary (Signed)
Antenatal Physician Discharge Summary  Patient ID: Fonda Rochon MRN: [id number] DOB/AGE: 1996/01/22 27 y.o.  Admit date: 01/20/2023 Discharge date: 01/22/2023  Admission Diagnoses: Principal Problem:   Fetal heart rate decelerations affecting management of mother Active Problems:   Epigastric pain    Discharge Diagnoses: Same  Prenatal Procedures: NST and ultrasound  Consults: None  Hospital Course:  Zerline Melchior is a 27 y.o. [id number] with IUP at [redacted]w[redacted]d admitted for decels in setting of epigastric pain and SOB. She got a work-up which included CTa, RUQ u/s, OB u/s and multiple labs including troponins, coags, BNP, CMP and cultures, all of which were negative. She was noted to have a cervical exam of closed. Baby was appropriately grown and she had no leaking of fluid and no bleeding. She was observed, fetal heart rate monitoring remained reassuring, and she had no other maternal-fetal concerns. Her epigastric pain improved and she was deemed stable for discharge to home with outpatient follow up.  Discharge Exam: Temp:  [97.6 F (36.4 C)-98.1 F (36.7 C)] 98.1 F (36.7 C) (06/27 0801) Pulse Rate:  [103-123] 103 (06/27 0801) Resp:  [16-20] 18 (06/27 0801) BP: (111-128)/(66-78) 112/67 (06/27 0801) SpO2:  [96 %-98 %] 97 % (06/27 0801) Physical Examination: CONSTITUTIONAL: Well-developed, well-nourished female in no acute distress.  HENT:  Normocephalic, atraumatic, External right and left ear normal.  EYES: Conjunctivae and EOM are normal.  No scleral icterus.  NECK: Normal range of motion, supple, no masses SKIN: Skin is warm and dry. No rash noted. Not diaphoretic. No erythema. No pallor. NEUROLOGIC: Alert and oriented to person, place, and time.  CARDIOVASCULAR: Normal heart rate noted, regular rhythm RESPIRATORY: Effort and breath sounds normal, no problems with respiration noted MUSCULOSKELETAL: Normal range of motion. No edema and no tenderness. ABDOMEN: Soft,  nontender, nondistended, gravid. CERVIX: Dilation: Closed Exam by:: Dr. Ladon Applebaum  Fetal monitoring: FHR: 120 bpm, Variability: moderate, Accelerations: Present, Decelerations: Absent  Uterine activity: quiet  Significant Diagnostic Studies:  Results for orders placed or performed during the hospital encounter of 01/20/23 (from the past 168 hour(s))  Urinalysis, Routine w reflex microscopic -Urine, Clean Catch   Collection Time: 01/20/23  6:11 PM  Result Value Ref Range   Color, Urine YELLOW YELLOW   APPearance CLEAR CLEAR   Specific Gravity, Urine 1.010 1.005 - 1.030   pH 7.0 5.0 - 8.0   Glucose, UA NEGATIVE NEGATIVE mg/dL   Hgb urine dipstick NEGATIVE NEGATIVE   Bilirubin Urine NEGATIVE NEGATIVE   Ketones, ur NEGATIVE NEGATIVE mg/dL   Protein, ur NEGATIVE NEGATIVE mg/dL   Nitrite NEGATIVE NEGATIVE   Leukocytes,Ua NEGATIVE NEGATIVE  Protein / creatinine ratio, urine   Collection Time: 01/20/23  6:11 PM  Result Value Ref Range   Creatinine, Urine 50 mg/dL   Total Protein, Urine 8 mg/dL   Protein Creatinine Ratio 0.16 (H) 0.00 - 0.15 mg/mg[Cre]  GC/Chlamydia probe amp ([facility])not at [facility]   Collection Time: 01/20/23  6:33 PM  Result Value Ref Range   Neisseria Gonorrhea Negative    Chlamydia Negative    Comment Normal Reference Ranger Chlamydia - Negative    Comment      Normal Reference Range Neisseria Gonorrhea - Negative  Type and screen [facility]   Collection Time: 01/20/23  6:41 PM  Result Value Ref Range   ABO/RH(D) O POS    Antibody Screen NEG    Sample Expiration      01/23/2023,2359 Performed at [facility], [address], [place], [place] [address]   Wet prep, genital   Collection Time: 01/20/23  6:42 PM   Specimen: Vaginal  Result Value Ref Range   Yeast Wet Prep HPF POC NONE SEEN NONE SEEN   Trich, Wet Prep NONE SEEN NONE SEEN   Clue Cells Wet Prep HPF POC NONE SEEN NONE SEEN   WBC, Wet Prep HPF POC <10 <10   Sperm NONE SEEN    CBC   Collection Time: 01/20/23  6:42 PM  Result Value Ref Range   WBC 10.9 (H) 4.0 - 10.5 K/uL   RBC 3.92 3.87 - 5.11 MIL/uL   Hemoglobin 11.4 (L) 12.0 - 15.0 g/dL   HCT 19.1 (L) 47.8 - 29.5 %   MCV 88.5 80.0 - 100.0 fL   MCH 29.1 26.0 - 34.0 pg   MCHC 32.9 30.0 - 36.0 g/dL   RDW 62.1 30.8 - 65.7 %   Platelets 171 150 - 400 K/uL   nRBC 0.0 0.0 - 0.2 %  Comprehensive metabolic panel   Collection Time: 01/20/23  6:42 PM  Result Value Ref Range   Sodium 134 (L) 135 - 145 mmol/L   Potassium 3.7 3.5 - 5.1 mmol/L   Chloride 102 98 - 111 mmol/L   CO2 22 22 - 32 mmol/L   Glucose, Bld 76 70 - 99 mg/dL   BUN 6 6 - 20 mg/dL   Creatinine, Ser 8.46 0.44 - 1.00 mg/dL   Calcium 8.7 (L) 8.9 - 10.3 mg/dL   Total Protein 6.8 6.5 - 8.1 g/dL   Albumin 2.8 (L) 3.5 - 5.0 g/dL   AST 20 15 - 41 U/L   ALT 22 0 - 44 U/L   Alkaline Phosphatase 191 (H) 38 - 126 U/L   Total Bilirubin 0.5 0.3 - 1.2 mg/dL   GFR, Estimated >96 >29 mL/min   Anion gap 10 5 - 15  Troponin I (High Sensitivity)   Collection Time: 01/20/23  6:42 PM  Result Value Ref Range   Troponin I (High Sensitivity) 7 <18 ng/L  Troponin I (High Sensitivity)   Collection Time: 01/20/23 11:42 PM  Result Value Ref Range   Troponin I (High Sensitivity) <2 <18 ng/L  Protime-INR   Collection Time: 01/21/23  3:12 AM  Result Value Ref Range   Prothrombin Time 14.0 11.4 - 15.2 seconds   INR 1.1 0.8 - 1.2  APTT   Collection Time: 01/21/23  3:12 AM  Result Value Ref Range   aPTT 31 24 - 36 seconds  Fibrinogen   Collection Time: 01/21/23  3:12 AM  Result Value Ref Range   Fibrinogen 706 (H) 210 - 475 mg/dL  Brain natriuretic peptide   Collection Time: 01/21/23  1:59 PM  Result Value Ref Range   B Natriuretic Peptide 79.0 0.0 - 100.0 pg/mL   [place] MFM OB COMP + 14 WK  Result Date: 01/21/2023 ----------------------------------------------------------------------  OBSTETRICS REPORT                       (Signed Final 01/21/2023 12:12 pm)  ---------------------------------------------------------------------- Patient Info  ID #:       [id number]                          D.O.B.:  10/22/1995 (27 yrs)  Name:       Revonda Humphrey Tussey                Visit Date: 01/21/2023  07:36 am ---------------------------------------------------------------------- Performed By  Attending:        Lin Landsman      Ref. Address:     [address]                    [place]                                                             [place], [place]                                                             [address]  Performed By:     Percell Boston          Secondary Phy.:   [facility] MAU/Triage                    RDMS  Referred By:      Dia Sitter                [facility] ---------------------------------------------------------------------- Orders  #  Description                           Code        Ordered By  1  [place] MFM OB COMP + 14 WK                76805.01    KYMBERLY FORSYTH ----------------------------------------------------------------------  #  Order #                     Accession #                Episode #  1  [id number]                   [id number]                 [id number] ---------------------------------------------------------------------- Indications  Obesity complicating pregnancy, third          O99.213  trimester  Poor obstetric history: Previous               O09.299  preeclampsia / eclampsia/gestational HTN  Fetal heart rate decelerations affecting       O36.8390  management of mother  Abdominal pain in pregnancy                    O99.89  [redacted] weeks gestation of pregnancy                Z3A.34 ---------------------------------------------------------------------- Fetal Evaluation  Num Of Fetuses:         1  Fetal Heart Rate(bpm):  126  Cardiac Activity:  Observed  Presentation:           Cephalic  Placenta:               Posterior  P. Cord Insertion:      Not well  visualized  Amniotic Fluid  AFI FV:      Within normal limits  AFI Sum(cm)     %Tile       Largest Pocket(cm)  14.5            51          5.4  RUQ(cm)       RLQ(cm)       LUQ(cm)        LLQ(cm)  3.5           5.4           1.4            4.2 ---------------------------------------------------------------------- Biometry  BPD:      88.2  mm     G. Age:  35w 5d         88  %    CI:        78.36   %    70 - 86                                                          FL/HC:      19.9   %    19.4 - 21.8  HC:      315.2  mm     G. Age:  35w 3d         49  %    HC/AC:      0.93        0.96 - 1.11  AC:      338.6  mm     G. Age:  37w 5d       > 99  %    FL/BPD:     71.1   %    71 - 87  FL:       62.7  mm     G. Age:  32w 3d          9  %    FL/AC:      18.5   %    20 - 24  Est. FW:    2823  gm      6 lb 4 oz     93  % ---------------------------------------------------------------------- Gestational Age  LMP:           34w 0d        Date:  05/28/22                  EDD:   03/04/23  U/S Today:     35w 2d                                        EDD:   02/23/23  Best:          34w 0d     Det. By:  LMP  (05/28/22)          EDD:   03/04/23 ---------------------------------------------------------------------- Anatomy  Cranium:  Appears normal         LVOT:                   Appears normal  Cavum:                 Appears normal         Aortic Arch:            Not well visualized  Ventricles:            Previously seen        Ductal Arch:            Not well visualized  Choroid Plexus:        Appears normal         Diaphragm:              Appears normal  Cerebellum:            Not well visualized    Stomach:                Appears normal, left                                                                        sided  Posterior Fossa:       Not well visualized    Abdomen:                Appears normal  Nuchal Fold:           Not applicable (>20    Abdominal Wall:         Not well visualized                         wks GA)   Face:                  Not well visualized    Cord Vessels:           Appears normal (3                                                                        vessel cord)  Lips:                  Not well visualized    Kidneys:                Appear normal  Palate:                Not well visualized    Bladder:                Appears normal  Thoracic:              Appears normal         Spine:                  Ltd views no  intracranial signs of                                                                        NTD  Heart:                 Not well visualized    Upper Extremities:      Not well visualized  RVOT:                  Appears normal         Lower Extremities:      Visualized  Other:  Technically difficult due to advanced GA and fetal position. ---------------------------------------------------------------------- Cervix Uterus Adnexa  Cervix  Not visualized (advanced GA >24wks)  Uterus  No abnormality visualized.  Right Ovary  No adnexal mass visualized.  Left Ovary  No adnexal mass visualized.  Cul De Sac  No free fluid seen.  Adnexa  No abnormality visualized ---------------------------------------------------------------------- Impression  Single intrauterine pregnancy here for a complete anatomy.  Normal anatomy with measurements consistent with dates  There is good fetal movement and amniotic fluid volume  Suboptimal views of the fetal anatomy were obtained  secondary to fetal position. ---------------------------------------------------------------------- Recommendations  Follow up as clinically indicated. ----------------------------------------------------------------------              Lin Landsman, MD Electronically Signed Final Report   01/21/2023 12:12 pm ----------------------------------------------------------------------  [place] MFM OB LIMITED  Result Date:  01/21/2023 ----------------------------------------------------------------------  OBSTETRICS REPORT                       (Signed Final 01/21/2023 12:04 pm) ---------------------------------------------------------------------- Patient Info  ID #:       [id number]                          D.O.B.:  Aug 17, 1995 (27 yrs)  Name:       Claudean Volpi                Visit Date: 01/20/2023 07:31 pm ---------------------------------------------------------------------- Performed By  Attending:        Lin Landsman      Ref. Address:     [address]                    [place]                                                             [place], [place]                                                             [address]  Performed By:     Marcellina Millin          Secondary Phy.:   [facility] MAU/Triage  RDMS  Referred By:      Dia Sitter                [facility] ---------------------------------------------------------------------- Orders  #  Description                           Code        Ordered By  1  [place] MFM OB LIMITED                     [phone]    ROLITTA [facility] ----------------------------------------------------------------------  #  Order #                     Accession #                Episode #  1  [id number]                   [id number]                 [id number] ---------------------------------------------------------------------- Indications  Pelvic pain affecting pregnancy in third       O26.893  trimester  [redacted] weeks gestation of pregnancy                Z3A.33 ---------------------------------------------------------------------- Fetal Evaluation  Num Of Fetuses:         1  Fetal Heart Rate(bpm):  161  Cardiac Activity:       Observed  Presentation:           Cephalic  Placenta:               Posterior  P. Cord Insertion:      Not well visualized  Amniotic Fluid  AFI FV:      Within normal limits  AFI Sum(cm)      %Tile       Largest Pocket(cm)  12.5            37          5.2  RUQ(cm)       RLQ(cm)       LUQ(cm)        LLQ(cm)  5.2           2.4           2.8            2.1  Comment:    No placental abruption or previa identified. ---------------------------------------------------------------------- Gestational Age  LMP:           33w 6d        Date:  05/28/22                  EDD:   03/04/23  Best:          33w 6d     Det. By:  LMP  (05/28/22)          EDD:   03/04/23 ---------------------------------------------------------------------- Anatomy  Cranium:               Appears normal  Stomach:                Appears normal, left                                                                        sided  Ventricles:            Appears normal         Kidneys:                Appear normal  Diaphragm:             Appears normal         Bladder:                Appears normal ---------------------------------------------------------------------- Cervix Uterus Adnexa  Cervix  Not visualized (advanced GA >24wks)  Uterus  No abnormality visualized.  Right Ovary  Within normal limits.  Left Ovary  Within normal limits.  Adnexa  No abnormality visualized ---------------------------------------------------------------------- Impression  Limited exam due to pelvic pain  Good fetal movement and amniotic fluid volume  No evidence of placental abruption ---------------------------------------------------------------------- Recommendations  Clinical correlation recommended. ----------------------------------------------------------------------              Lin Landsman, MD Electronically Signed Final Report   01/21/2023 12:04 pm ----------------------------------------------------------------------  CT Angio Chest Pulmonary Embolism (PE) W or WO Contrast  Result Date: 01/21/2023 CLINICAL DATA:  27 year old female with history of tachycardia, shortness of breath and left upper quadrant abdominal pain. EXAM: CT ANGIOGRAPHY CHEST  WITH CONTRAST TECHNIQUE: Multidetector CT imaging of the chest was performed using the standard protocol during bolus administration of intravenous contrast. Multiplanar CT image reconstructions and MIPs were obtained to evaluate the vascular anatomy. RADIATION DOSE REDUCTION: This exam was performed according to the departmental dose-optimization program which includes automated exposure control, adjustment of the mA and/or kV according to patient size and/or use of iterative reconstruction technique. CONTRAST:  75mL OMNIPAQUE IOHEXOL 350 MG/ML SOLN COMPARISON:  No priors. FINDINGS: Cardiovascular: No filling defects are noted within the pulmonary arterial tree to suggest pulmonary embolism. Heart size is normal. There is no significant pericardial fluid, thickening or pericardial calcification. Dilatation of the pulmonic trunk (3.7 cm in diameter), could suggest pulmonary arterial hypertension. No atherosclerotic calcifications are noted in the thoracic aorta or the coronary arteries. Mediastinum/Nodes: No pathologically enlarged mediastinal or hilar lymph nodes. Esophagus is unremarkable in appearance. No axillary lymphadenopathy. Lungs/Pleura: Minimal subsegmental atelectasis noted in the lung bases (right-greater-than-left). No acute consolidative airspace disease. No pleural effusions. No suspicious appearing pulmonary nodules or masses are noted. Upper Abdomen: Unremarkable. Musculoskeletal: There are no aggressive appearing lytic or blastic lesions noted in the visualized portions of the skeleton. Review of the MIP images confirms the above findings. IMPRESSION: 1. No acute findings to account for the patient's symptoms. Specifically, no evidence of pulmonary embolism. 2. Mild subsegmental atelectasis in the lung bases bilaterally. 3. Mild dilatation of the pulmonic trunk (3.7 cm in diameter, which could suggest pulmonary arterial hypertension. Electronically Signed   By: Trudie Reed M.D.   On:  01/21/2023 11:51   US Abdomen Limited RUQ (LIVER/GB)  Result Date: 01/20/2023 CLINICAL DATA:  Upper abdominal pain. EXAM: ULTRASOUND ABDOMEN LIMITED RIGHT UPPER QUADRANT COMPARISON:  CT abdomen pelvis dated 02/21/2019. FINDINGS: Gallbladder: No gallstones or wall thickening visualized. No sonographic Murphy sign noted by sonographer. Common bile duct: Diameter: 2 mm Liver: No focal lesion identified. Within normal limits in parenchymal echogenicity. Portal vein is patent on color Doppler imaging with normal direction of blood flow towards the liver. Other: None. IMPRESSION: Unremarkable right upper quadrant ultrasound. Electronically Signed   By: Elgie Collard M.D.   On: 01/20/2023 21:44    Future Appointments  Date Time Provider Department Center  01/28/2023  2:10 PM Arabella Merles, CNM [facility]    Discharge Condition: Stable  Discharge disposition: 01-Home or Self Care       Discharge Instructions     Activity as tolerated - No restrictions   Complete by: As directed    Call MD for:   Complete by: As directed    Contractions, vaginal bleeding, leakage of fluid like your water broke or if you aren't feeling your baby move normally   Call MD for:  difficulty breathing, headache or visual disturbances   Complete by: As directed    Call MD for:  persistant nausea and vomiting   Complete by: As directed    Call MD for:  severe uncontrolled pain   Complete by: As directed    Call MD for:  temperature >100.4   Complete by: As directed    Diet - low sodium heart healthy   Complete by: As directed    Discharge patient   Complete by: As directed    Discharge disposition: 01-Home or Self Care   Discharge patient date: 01/22/2023   May shower / Bathe   Complete by: As directed       Allergies as of 01/22/2023   No Known Allergies      Medication List     STOP taking these medications    penicillin v potassium 500 MG tablet Commonly known as: VEETID        TAKE these medications    aspirin EC 81 MG tablet Take 2 tablets (162 mg total) by mouth daily. Swallow whole.   Blood Pressure Monitor Misc For regular home bp monitoring during pregnancy   Doxylamine-Pyridoxine 10-10 MG Tbec Commonly known as: Diclegis Take 2 qhs; may also take one in am and one in afternoon prn nausea   flintstones complete 60 MG chewable tablet Chew 2 tablets by mouth daily.   ondansetron 4 MG disintegrating tablet Commonly known as: ZOFRAN-ODT Take 1 tablet (4 mg total) by mouth every 6 (six) hours as needed for nausea.   pantoprazole 20 MG tablet Commonly known as: Protonix Take 1 tablet (20 mg total) by mouth daily.        Follow-up Information     [facility] Follow up on 01/28/2023.   Specialty: Obstetrics and Gynecology Why: Please keep your scheduled appointment with Joellyn Haff, CNM Contact information: [address] [place] [address] [phone]                Total discharge time: 30 minutes   Signed: Reva Bores M.D. 01/22/2023, 9:45 AM

## 2023-01-22 NOTE — Progress Notes (Signed)
FACULTY PRACTICE ANTEPARTUM COMPREHENSIVE PROGRESS NOTE  Carla Rose is a 27 y.o. [id number] at [redacted]w[redacted]d who is admitted for fetal heart rate decelerations and epigastric pain/SOB.  Estimated Date of Delivery: 03/04/23  Length of Stay:  1 Days. Admitted 01/20/2023  Subjective: Feeling a little better this morning. Epigastric pain improving, SOB stable. Patient reports good fetal movement.  She reports no uterine contractions, no bleeding and no loss of fluid per vagina.  Vitals:  Blood pressure 112/67, pulse (!) 103, temperature 98.1 F (36.7 C), temperature source Oral, resp. rate 18, height 5\' 8"  (1.727 m), weight 98 kg, last menstrual period 05/28/2022, SpO2 97 %. Physical Examination: CONSTITUTIONAL: Well-developed, well-nourished female in no acute distress.  CARDIOVASCULAR: Tachycardic RESPIRATORY: Effort normal, no problems with respiration noted ABDOMEN: Soft, nontender, nondistended, gravid. CERVIX: Dilation: Closed Exam by:: Dr. Ladon Applebaum  Fetal monitoring: pt currently on monitor, 120bpm, moderate variability, 1 accel, no decels  Results for orders placed or performed during the hospital encounter of 01/20/23 (from the past 48 hour(s))  Urinalysis, Routine w reflex microscopic -Urine, Clean Catch     Status: None   Collection Time: 01/20/23  6:11 PM  Result Value Ref Range   Color, Urine YELLOW YELLOW   APPearance CLEAR CLEAR   Specific Gravity, Urine 1.010 1.005 - 1.030   pH 7.0 5.0 - 8.0   Glucose, UA NEGATIVE NEGATIVE mg/dL   Hgb urine dipstick NEGATIVE NEGATIVE   Bilirubin Urine NEGATIVE NEGATIVE   Ketones, ur NEGATIVE NEGATIVE mg/dL   Protein, ur NEGATIVE NEGATIVE mg/dL   Nitrite NEGATIVE NEGATIVE   Leukocytes,Ua NEGATIVE NEGATIVE    Comment: Performed at [facility], [address], [place], [place] [address]  Protein / creatinine ratio, urine     Status: Abnormal   Collection Time: 01/20/23  6:11 PM  Result Value Ref Range   Creatinine, Urine 50 mg/dL    Total Protein, Urine 8 mg/dL    Comment: NO NORMAL RANGE ESTABLISHED FOR THIS TEST   Protein Creatinine Ratio 0.16 (H) 0.00 - 0.15 mg/mg[Cre]    Comment: Performed at [facility], [address], [place], [place] [address]  GC/Chlamydia probe amp ([place])not at [facility]     Status: None   Collection Time: 01/20/23  6:33 PM  Result Value Ref Range   Neisseria Gonorrhea Negative    Chlamydia Negative    Comment Normal Reference Ranger Chlamydia - Negative    Comment      Normal Reference Range Neisseria Gonorrhea - Negative  Type and screen [facility]     Status: None   Collection Time: 01/20/23  6:41 PM  Result Value Ref Range   ABO/RH(D) O POS    Antibody Screen NEG    Sample Expiration      01/23/2023,2359 Performed at [facility], [address], [place], [place] [address]   CBC     Status: Abnormal   Collection Time: 01/20/23  6:42 PM  Result Value Ref Range   WBC 10.9 (H) 4.0 - 10.5 K/uL   RBC 3.92 3.87 - 5.11 MIL/uL   Hemoglobin 11.4 (L) 12.0 - 15.0 g/dL   HCT 85.4 (L) 62.7 - 03.5 %   MCV 88.5 80.0 - 100.0 fL   MCH 29.1 26.0 - 34.0 pg   MCHC 32.9 30.0 - 36.0 g/dL   RDW 00.9 38.1 - 82.9 %   Platelets 171 150 - 400 K/uL   nRBC 0.0 0.0 - 0.2 %  Comment: Performed at [facility], [address], [place], [place] [address]  Comprehensive metabolic panel     Status: Abnormal   Collection Time: 01/20/23  6:42 PM  Result Value Ref Range   Sodium 134 (L) 135 - 145 mmol/L   Potassium 3.7 3.5 - 5.1 mmol/L   Chloride 102 98 - 111 mmol/L   CO2 22 22 - 32 mmol/L   Glucose, Bld 76 70 - 99 mg/dL    Comment: Glucose reference range applies only to samples taken after fasting for at least 8 hours.   BUN 6 6 - 20 mg/dL   Creatinine, Ser 1.91 0.44 - 1.00 mg/dL   Calcium 8.7 (L) 8.9 - 10.3 mg/dL   Total Protein 6.8 6.5 - 8.1 g/dL   Albumin 2.8 (L) 3.5 - 5.0 g/dL   AST 20 15 - 41 U/L   ALT 22 0 - 44 U/L   Alkaline Phosphatase 191 (H) 38 -  126 U/L   Total Bilirubin 0.5 0.3 - 1.2 mg/dL   GFR, Estimated >47 >82 mL/min    Comment: (NOTE) Calculated using the CKD-EPI Creatinine Equation (2021)    Anion gap 10 5 - 15    Comment: Performed at [facility], [address], [place], [place] [address]  Wet prep, genital     Status: None   Collection Time: 01/20/23  6:42 PM   Specimen: Vaginal  Result Value Ref Range   Yeast Wet Prep HPF POC NONE SEEN NONE SEEN   Trich, Wet Prep NONE SEEN NONE SEEN   Clue Cells Wet Prep HPF POC NONE SEEN NONE SEEN   WBC, Wet Prep HPF POC <10 <10   Sperm NONE SEEN     Comment: Performed at [facility], [address], [place], [place] [address]  Troponin I (High Sensitivity)     Status: None   Collection Time: 01/20/23  6:42 PM  Result Value Ref Range   Troponin I (High Sensitivity) 7 <18 ng/L    Comment: (NOTE) Elevated high sensitivity troponin I (hsTnI) values and significant  changes across serial measurements may suggest ACS but many other  chronic and acute conditions are known to elevate hsTnI results.  Refer to the "Links" section for chest pain algorithms and additional  guidance. Performed at [facility], [address], [place], [place] [address]   Troponin I (High Sensitivity)     Status: None   Collection Time: 01/20/23 11:42 PM  Result Value Ref Range   Troponin I (High Sensitivity) <2 <18 ng/L    Comment: (NOTE) Elevated high sensitivity troponin I (hsTnI) values and significant  changes across serial measurements may suggest ACS but many other  chronic and acute conditions are known to elevate hsTnI results.  Refer to the "Links" section for chest pain algorithms and additional  guidance. Performed at [facility], [address] [place], [place] [address]   Protime-INR     Status: None   Collection Time: 01/21/23  3:12 AM  Result Value Ref Range   Prothrombin Time 14.0 11.4 - 15.2 seconds   INR 1.1 0.8 - 1.2    Comment: (NOTE) INR goal  varies based on device and disease states. Performed at [facility], [address] [place], [place] [address]   APTT     Status: None   Collection Time: 01/21/23  3:12 AM  Result Value Ref Range   aPTT 31 24 -  36 seconds    Comment: Performed at [facility], [address] [place], [place] [address]  Fibrinogen     Status: Abnormal   Collection Time: 01/21/23  3:12 AM  Result Value Ref Range   Fibrinogen 706 (H) 210 - 475 mg/dL    Comment: (NOTE) Fibrinogen results may be underestimated in patients receiving thrombolytic therapy. Performed at [facility], [address] [place], [place] [address]   Brain natriuretic peptide     Status: None   Collection Time: 01/21/23  1:59 PM  Result Value Ref Range   B Natriuretic Peptide 79.0 0.0 - 100.0 pg/mL    Comment: Performed at [facility], [address] [place], [place] [address]    [place] MFM OB COMP + 14 WK  Result Date: 01/21/2023 ----------------------------------------------------------------------  OBSTETRICS REPORT                       (Signed Final 01/21/2023 12:12 pm) ---------------------------------------------------------------------- Patient Info  ID #:       [id number]                          D.O.B.:  11-20-1995 (27 yrs)  Name:       Carla Rose                Visit Date: 01/21/2023 07:36 am ---------------------------------------------------------------------- Performed By  Attending:        Lin Landsman      Ref. Address:     [address]                    [place]                                                             [place], [place]                                                             [address]  Performed By:     Percell Boston          Secondary Phy.:   [facility] MAU/Triage                    RDMS  Referred By:      Dia Sitter                [facility]  ---------------------------------------------------------------------- Orders  #  Description                           Code        Ordered By  1  [place] MFM OB COMP +  14 WK                [address].01    Jymir Dunaj ----------------------------------------------------------------------  #  Order #                     Accession #                Episode #  1  [id number]                   [id number]                 [id number] ---------------------------------------------------------------------- Indications  Obesity complicating pregnancy, third          O99.213  trimester  Poor obstetric history: Previous               O09.299  preeclampsia / eclampsia/gestational HTN  Fetal heart rate decelerations affecting       O36.8390  management of mother  Abdominal pain in pregnancy                    O99.89  [redacted] weeks gestation of pregnancy                Z3A.34 ---------------------------------------------------------------------- Fetal Evaluation  Num Of Fetuses:         1  Fetal Heart Rate(bpm):  126  Cardiac Activity:       Observed  Presentation:           Cephalic  Placenta:               Posterior  P. Cord Insertion:      Not well visualized  Amniotic Fluid  AFI FV:      Within normal limits  AFI Sum(cm)     %Tile       Largest Pocket(cm)  14.5            51          5.4  RUQ(cm)       RLQ(cm)       LUQ(cm)        LLQ(cm)  3.5           5.4           1.4            4.2 ---------------------------------------------------------------------- Biometry  BPD:      88.2  mm     G. Age:  35w 5d         88  %    CI:        78.36   %    70 - 86                                                          FL/HC:      19.9   %    19.4 - 21.8  HC:      315.2  mm     G. Age:  35w 3d         49  %    HC/AC:      0.93        0.96 - 1.11  AC:      338.6  mm     G. Age:  37w 5d       >  99  %    FL/BPD:     71.1   %    71 - 87  FL:       62.7  mm     G. Age:  32w 3d          9  %    FL/AC:      18.5   %    20 - 24  Est. FW:    2823  gm      6 lb  4 oz     93  % ---------------------------------------------------------------------- Gestational Age  LMP:           34w 0d        Date:  05/28/22                  EDD:   03/04/23  U/S Today:     35w 2d                                        EDD:   02/23/23  Best:          34w 0d     Det. By:  LMP  (05/28/22)          EDD:   03/04/23 ---------------------------------------------------------------------- Anatomy  Cranium:               Appears normal         LVOT:                   Appears normal  Cavum:                 Appears normal         Aortic Arch:            Not well visualized  Ventricles:            Previously seen        Ductal Arch:            Not well visualized  Choroid Plexus:        Appears normal         Diaphragm:              Appears normal  Cerebellum:            Not well visualized    Stomach:                Appears normal, left                                                                        sided  Posterior Fossa:       Not well visualized    Abdomen:                Appears normal  Nuchal Fold:           Not applicable (>20    Abdominal Wall:         Not well visualized  wks GA)  Face:                  Not well visualized    Cord Vessels:           Appears normal (3                                                                        vessel cord)  Lips:                  Not well visualized    Kidneys:                Appear normal  Palate:                Not well visualized    Bladder:                Appears normal  Thoracic:              Appears normal         Spine:                  Ltd views no                                                                        intracranial signs of                                                                        NTD  Heart:                 Not well visualized    Upper Extremities:      Not well visualized  RVOT:                  Appears normal         Lower Extremities:      Visualized  Other:  Technically difficult due to  advanced GA and fetal position. ---------------------------------------------------------------------- Cervix Uterus Adnexa  Cervix  Not visualized (advanced GA >24wks)  Uterus  No abnormality visualized.  Right Ovary  No adnexal mass visualized.  Left Ovary  No adnexal mass visualized.  Cul De Sac  No free fluid seen.  Adnexa  No abnormality visualized ---------------------------------------------------------------------- Impression  Single intrauterine pregnancy here for a complete anatomy.  Normal anatomy with measurements consistent with dates  There is good fetal movement and amniotic fluid volume  Suboptimal views of the fetal anatomy were obtained  secondary to fetal position. ---------------------------------------------------------------------- Recommendations  Follow up as clinically indicated. ----------------------------------------------------------------------              Lin Landsman, MD Electronically Signed Final Report   01/21/2023 12:12  pm ----------------------------------------------------------------------  [place] MFM OB LIMITED  Result Date: 01/21/2023 ----------------------------------------------------------------------  OBSTETRICS REPORT                       (Signed Final 01/21/2023 12:04 pm) ---------------------------------------------------------------------- Patient Info  ID #:       [id number]                          D.O.B.:  1996-04-17 (27 yrs)  Name:       Carla Rose                Visit Date: 01/20/2023 07:31 pm ---------------------------------------------------------------------- Performed By  Attending:        Lin Landsman      Ref. Address:     [address]                    [place]                                                             [place], [place]                                                             [address]  Performed By:     Marcellina Millin          Secondary Phy.:   [facility] MAU/Triage                    RDMS  Referred By:      Dia Sitter                [facility] ---------------------------------------------------------------------- Orders  #  Description                           Code        Ordered By  1  [place] MFM OB LIMITED                     19147.82    NFAOZHY [facility] ----------------------------------------------------------------------  #  Order #                     Accession #                Episode #  1  [id number]                   [id number]                 [id number] ---------------------------------------------------------------------- Indications  Pelvic pain affecting pregnancy in third       O26.893  trimester  [redacted]  weeks gestation of pregnancy                Z3A.33 ---------------------------------------------------------------------- Fetal Evaluation  Num Of Fetuses:         1  Fetal Heart Rate(bpm):  161  Cardiac Activity:       Observed  Presentation:           Cephalic  Placenta:               Posterior  P. Cord Insertion:      Not well visualized  Amniotic Fluid  AFI FV:      Within normal limits  AFI Sum(cm)     %Tile       Largest Pocket(cm)  12.5            37          5.2  RUQ(cm)       RLQ(cm)       LUQ(cm)        LLQ(cm)  5.2           2.4           2.8            2.1  Comment:    No placental abruption or previa identified. ---------------------------------------------------------------------- Gestational Age  LMP:           33w 6d        Date:  05/28/22                  EDD:   03/04/23  Best:          33w 6d     Det. By:  LMP  (05/28/22)          EDD:   03/04/23 ---------------------------------------------------------------------- Anatomy  Cranium:               Appears normal         Stomach:                Appears normal, left                                                                        sided  Ventricles:            Appears normal         Kidneys:                Appear normal  Diaphragm:             Appears normal         Bladder:                 Appears normal ---------------------------------------------------------------------- Cervix Uterus Adnexa  Cervix  Not visualized (advanced GA >24wks)  Uterus  No abnormality visualized.  Right Ovary  Within normal limits.  Left Ovary  Within normal limits.  Adnexa  No abnormality visualized ---------------------------------------------------------------------- Impression  Limited exam due to pelvic pain  Good fetal movement and amniotic fluid volume  No evidence of placental abruption ---------------------------------------------------------------------- Recommendations  Clinical correlation recommended. ----------------------------------------------------------------------              Lin Landsman, MD Electronically Signed Final Report   01/21/2023 12:04 pm ----------------------------------------------------------------------  CT Angio Chest Pulmonary Embolism (PE) W or WO Contrast  Result Date: 01/21/2023 CLINICAL DATA:  27 year old female with history of tachycardia, shortness of breath and left upper quadrant abdominal pain. EXAM: CT ANGIOGRAPHY CHEST WITH CONTRAST TECHNIQUE: Multidetector CT imaging of the chest was performed using the standard protocol during bolus administration of intravenous contrast. Multiplanar CT image reconstructions and MIPs were obtained to evaluate the vascular anatomy. RADIATION DOSE REDUCTION: This exam was performed according to the departmental dose-optimization program which includes automated exposure control, adjustment of the mA and/or kV according to patient size and/or use of iterative reconstruction technique. CONTRAST:  75mL OMNIPAQUE IOHEXOL 350 MG/ML SOLN COMPARISON:  No priors. FINDINGS: Cardiovascular: No filling defects are noted within the pulmonary arterial tree to suggest pulmonary embolism. Heart size is normal. There is no significant pericardial fluid, thickening or pericardial calcification. Dilatation of the pulmonic trunk (3.7 cm in diameter),  could suggest pulmonary arterial hypertension. No atherosclerotic calcifications are noted in the thoracic aorta or the coronary arteries. Mediastinum/Nodes: No pathologically enlarged mediastinal or hilar lymph nodes. Esophagus is unremarkable in appearance. No axillary lymphadenopathy. Lungs/Pleura: Minimal subsegmental atelectasis noted in the lung bases (right-greater-than-left). No acute consolidative airspace disease. No pleural effusions. No suspicious appearing pulmonary nodules or masses are noted. Upper Abdomen: Unremarkable. Musculoskeletal: There are no aggressive appearing lytic or blastic lesions noted in the visualized portions of the skeleton. Review of the MIP images confirms the above findings. IMPRESSION: 1. No acute findings to account for the patient's symptoms. Specifically, no evidence of pulmonary embolism. 2. Mild subsegmental atelectasis in the lung bases bilaterally. 3. Mild dilatation of the pulmonic trunk (3.7 cm in diameter, which could suggest pulmonary arterial hypertension. Electronically Signed   By: Trudie Reed M.D.   On: 01/21/2023 11:51   US Abdomen Limited RUQ (LIVER/GB)  Result Date: 01/20/2023 CLINICAL DATA:  Upper abdominal pain. EXAM: ULTRASOUND ABDOMEN LIMITED RIGHT UPPER QUADRANT COMPARISON:  CT abdomen pelvis dated 02/21/2019. FINDINGS: Gallbladder: No gallstones or wall thickening visualized. No sonographic Murphy sign noted by sonographer. Common bile duct: Diameter: 2 mm Liver: No focal lesion identified. Within normal limits in parenchymal echogenicity. Portal vein is patent on color Doppler imaging with normal direction of blood flow towards the liver. Other: None. IMPRESSION: Unremarkable right upper quadrant ultrasound. Electronically Signed   By: Elgie Collard M.D.   On: 01/20/2023 21:44    Current scheduled medications  aspirin EC  162 mg Oral Daily   docusate sodium  100 mg Oral Daily   pantoprazole  20 mg Oral Daily   prenatal multivitamin  1  tablet Oral Q1200    I have reviewed the patient's current medications.  ASSESSMENT: Principal Problem:   Fetal heart rate decelerations affecting management of mother Active Problems:   Epigastric pain Improving. Extensive work up completed with no e/o cardiopulmonary etiology of patient's symptoms. No e/o gallbladder/biliary disease. Stable from maternal standpoint for discharge home.   PLAN: NST this AM. If reactive & reassuring, will discharge home.  Harvie Bridge, MD Obstetrician & Gynecologist, [facility] for [employer], [facility]

## 2023-01-24 ENCOUNTER — Inpatient Hospital Stay (HOSPITAL_COMMUNITY): Payer: BC Managed Care – PPO

## 2023-01-24 ENCOUNTER — Inpatient Hospital Stay (HOSPITAL_COMMUNITY)
Admission: AD | Admit: 2023-01-24 | Discharge: 2023-01-24 | Disposition: A | Discharge status: Home / Self Care | Payer: BC Managed Care – PPO | Attending: Obstetrics & Gynecology | Admitting: Obstetrics & Gynecology

## 2023-01-24 ENCOUNTER — Encounter (HOSPITAL_COMMUNITY): Payer: Self-pay | Admitting: Obstetrics & Gynecology

## 2023-01-24 DIAGNOSIS — M549 Dorsalgia, unspecified: Secondary | ICD-10-CM | POA: Diagnosis not present

## 2023-01-24 DIAGNOSIS — Z3689 Encounter for other specified antenatal screening: Secondary | ICD-10-CM

## 2023-01-24 DIAGNOSIS — Z3A34 34 weeks gestation of pregnancy: Secondary | ICD-10-CM | POA: Insufficient documentation

## 2023-01-24 DIAGNOSIS — O99891 Other specified diseases and conditions complicating pregnancy: Secondary | ICD-10-CM | POA: Insufficient documentation

## 2023-01-24 DIAGNOSIS — O26893 Other specified pregnancy related conditions, third trimester: Secondary | ICD-10-CM | POA: Insufficient documentation

## 2023-01-24 DIAGNOSIS — M62830 Muscle spasm of back: Secondary | ICD-10-CM | POA: Diagnosis not present

## 2023-01-24 LAB — URINALYSIS, ROUTINE W REFLEX MICROSCOPIC
Bilirubin Urine: NEGATIVE
Glucose, UA: NEGATIVE mg/dL
Hgb urine dipstick: NEGATIVE
Ketones, ur: NEGATIVE mg/dL
Nitrite: NEGATIVE
Protein, ur: NEGATIVE mg/dL
Specific Gravity, Urine: 1.01 (ref 1.005–1.030)
pH: 6 (ref 5.0–8.0)

## 2023-01-24 LAB — CBC
HCT: 33.6 % — ABNORMAL LOW (ref 36.0–46.0)
Hemoglobin: 11 g/dL — ABNORMAL LOW (ref 12.0–15.0)
MCH: 29.5 pg (ref 26.0–34.0)
MCHC: 32.7 g/dL (ref 30.0–36.0)
MCV: 90.1 fL (ref 80.0–100.0)
Platelets: 202 10*3/uL (ref 150–400)
RBC: 3.73 MIL/uL — ABNORMAL LOW (ref 3.87–5.11)
RDW: 15.3 % (ref 11.5–15.5)
WBC: 12.4 10*3/uL — ABNORMAL HIGH (ref 4.0–10.5)
nRBC: 0.2 % (ref 0.0–0.2)

## 2023-01-24 LAB — COMPREHENSIVE METABOLIC PANEL
ALT: 59 U/L — ABNORMAL HIGH (ref 0–44)
AST: 41 U/L (ref 15–41)
Albumin: 2.8 g/dL — ABNORMAL LOW (ref 3.5–5.0)
Alkaline Phosphatase: 178 U/L — ABNORMAL HIGH (ref 38–126)
Anion gap: 17 — ABNORMAL HIGH (ref 5–15)
BUN: 8 mg/dL (ref 6–20)
CO2: 20 mmol/L — ABNORMAL LOW (ref 22–32)
Calcium: 9.2 mg/dL (ref 8.9–10.3)
Chloride: 98 mmol/L (ref 98–111)
Creatinine, Ser: 0.53 mg/dL (ref 0.44–1.00)
GFR, Estimated: >60 mL/min (ref >60)
Glucose, Bld: 94 mg/dL (ref 70–99)
Potassium: 3.9 mmol/L (ref 3.5–5.1)
Sodium: 135 mmol/L (ref 135–145)
Total Bilirubin: 0.3 mg/dL (ref 0.3–1.2)
Total Protein: 7.5 g/dL (ref 6.5–8.1)

## 2023-01-24 LAB — FIBRINOGEN: Fibrinogen: 595 mg/dL — ABNORMAL HIGH (ref 210–475)

## 2023-01-24 LAB — PROTEIN / CREATININE RATIO, URINE
Creatinine, Urine: 41 mg/dL
Total Protein, Urine: <6 mg/dL

## 2023-01-24 LAB — TROPONIN I (HIGH SENSITIVITY)
Troponin I (High Sensitivity): 2 ng/L (ref <18)
Troponin I (High Sensitivity): <2 ng/L (ref <18)

## 2023-01-24 LAB — PROTIME-INR
INR: 1 (ref 0.8–1.2)
Prothrombin Time: 13.5 seconds (ref 11.4–15.2)

## 2023-01-24 LAB — APTT: aPTT: 27 seconds (ref 24–36)

## 2023-01-24 LAB — BRAIN NATRIURETIC PEPTIDE: B Natriuretic Peptide: 34.3 pg/mL (ref 0.0–100.0)

## 2023-01-24 MED ORDER — CYCLOBENZAPRINE HCL 5 MG PO TABS
10.0000 mg | ORAL_TABLET | Freq: Once | ORAL | Status: AC
  Administered 2023-01-24: 10 mg via ORAL
  Filled 2023-01-24: qty 2

## 2023-01-24 MED ORDER — CYCLOBENZAPRINE HCL 10 MG PO TABS: 10.0000 mg | ORAL_TABLET | Freq: Three times a day (TID) | ORAL | 1 refills | Status: DC | PRN

## 2023-01-24 MED ORDER — IOHEXOL 350 MG/ML SOLN
75.0000 mL | Freq: Once | INTRAVENOUS | Status: AC | PRN
  Administered 2023-01-24: 75 mL via INTRAVENOUS

## 2023-01-24 MED ORDER — OXYCODONE-ACETAMINOPHEN 5-325 MG PO TABS
2.0000 | ORAL_TABLET | Freq: Once | ORAL | Status: AC
  Administered 2023-01-24: 2 via ORAL
  Filled 2023-01-24: qty 2

## 2023-01-24 MED ORDER — DIAZEPAM 2 MG PO TABS: 2.0000 mg | ORAL_TABLET | Freq: Four times a day (QID) | ORAL | 0 refills | Status: DC | PRN

## 2023-01-24 MED ORDER — HYDROMORPHONE HCL 1 MG/ML IJ SOLN
1.0000 mg | Freq: Once | INTRAMUSCULAR | Status: AC
  Administered 2023-01-24: 1 mg via INTRAVENOUS
  Filled 2023-01-24: qty 1

## 2023-01-24 NOTE — Discharge Instructions (Signed)
-   Take flexeril every 8 hours, take valium if needed at alternate times. - Stretch your back out frequently, on all 4s, rounding the back as much as possible. - Rest in the fetal position with a heating pad on your back for intervals

## 2023-01-24 NOTE — MAU Provider Note (Cosign Needed Addendum)
History     CSN: [id number]  Arrival date and time: 01/24/23 9562   Event Date/Time   First Provider Initiated Contact with Patient 01/24/23 0531     Chief Complaint  Patient presents with   Back Pain   Abdominal Pain  Carla Rose is a 27 y.o. [id number] at [redacted]w[redacted]d who receives care at [facility].  She presents today for chest and back pain.  She states she was evaluated and admitted, 3 days ago, for the same issue.  She states she was discharged on Wednesday with some improvement in pain but not completely resolved. She states the pain got worse yesterday around 6 or 7pm.  She reports she took tylenol at 11am with no relief.  She reports her pain is worse with laying down.  She states the pain is constant and located in the rib cage and upper back. She states the pain causes her chest to hurt and difficulty with breathing.  She endorses fetal movement and denies vaginal concerns. No issues with urination, diarrhea, or constipation. She denies recent sexual activity.   OB History     Gravida  5   Para  3   Term  3   Preterm      AB  1   Living  3      SAB      IAB      Ectopic  1   Multiple  0   Live Births  3          Past Medical History:  Diagnosis Date   Anemia    Chlamydia    Gonorrhea    Hypertension    Pregnancy induced hypertension    Past Surgical History:  Procedure Laterality Date   LAPAROSCOPIC UNILATERAL SALPINGECTOMY Left 01/30/2022   Procedure: LEFT LAPAROSCOPIC SALPINGECTOMY WITH REMOVAL OF ECTOPIC PREGNANCY;  Surgeon: Adam Phenix, MD;  Location: [facility] OR;  Service: Gynecology;  Laterality: Left;   NO PAST SURGERIES     Family History  Problem Relation Age of Onset   Crohn's disease Father    Crohn's disease Sister    Heart disease Paternal Grandmother    Social History   Tobacco Use   Smoking status: Former    Packs/day: 5    Types: Cigars, Cigarettes   Smokeless tobacco: Never  Vaping Use   Vaping Use: Never used  Substance Use  Topics   Alcohol use: No   Drug use: No   Allergies: No Known Allergies  No medications prior to admission.   Review of Systems  Respiratory:  Positive for shortness of breath.   Cardiovascular:  Positive for chest pain.  Gastrointestinal:  Negative for abdominal pain, nausea and vomiting.  Genitourinary:  Negative for difficulty urinating, dysuria, vaginal bleeding and vaginal discharge.  Musculoskeletal:  Positive for back pain.  Neurological:  Negative for dizziness, light-headedness and headaches.   Physical Exam   Blood pressure 114/67, pulse 94, temperature 98.4 F (36.9 C), temperature source Oral, resp. rate (!) 24, height 5\' 8"  (1.727 m), weight 220 lb 4.8 oz (99.9 kg), last menstrual period 05/28/2022, SpO2 98 %. Vitals:   01/24/23 0900 01/24/23 0915 01/24/23 0930 01/24/23 0945  BP: 109/69 115/69 117/75 114/67  Pulse: 90 96 (!) 105 94  Resp:      Temp:      TempSrc:      SpO2:      Weight:      Height:       Physical Exam Constitutional:  General: She is in acute distress.     Appearance: Normal appearance. She is obese.  HENT:     Head: Normocephalic and atraumatic.  Eyes:     Conjunctiva/sclera: Conjunctivae normal.  Cardiovascular:     Rate and Rhythm: Tachycardia present.     Heart sounds: Normal heart sounds.  Pulmonary:     Effort: Tachypnea present.     Breath sounds: Examination of the right-lower field reveals decreased breath sounds. Examination of the left-lower field reveals decreased breath sounds. Decreased breath sounds present.  Abdominal:     Palpations: Abdomen is soft.     Tenderness: There is no abdominal tenderness.     Comments: Gravid--fundal height appears LGA, Soft, NT   Genitourinary:    Comments: Dilation: Closed Station: Ballotable Exam by:: Sabas Sous CNM  Musculoskeletal:        General: Normal range of motion.     Cervical back: Normal range of motion.       Back:     Comments: Tenderness  Skin:    General: Skin is  warm and dry.  Neurological:     Mental Status: She is alert and oriented to person, place, and time.  Psychiatric:        Mood and Affect: Mood normal.        Behavior: Behavior normal.    Fetal Assessment 150 bpm, Mod Var, -Decels, +15x15Accels Toco: No ctx graphed  MAU Course   Results for orders placed or performed during the hospital encounter of 01/24/23 (from the past 24 hour(s))  CBC     Status: Abnormal   Collection Time: 01/24/23  5:45 AM  Result Value Ref Range   WBC 12.4 (H) 4.0 - 10.5 K/uL   RBC 3.73 (L) 3.87 - 5.11 MIL/uL   Hemoglobin 11.0 (L) 12.0 - 15.0 g/dL   HCT 16.1 (L) 09.6 - 04.5 %   MCV 90.1 80.0 - 100.0 fL   MCH 29.5 26.0 - 34.0 pg   MCHC 32.7 30.0 - 36.0 g/dL   RDW 40.9 81.1 - 91.4 %   Platelets 202 150 - 400 K/uL   nRBC 0.2 0.0 - 0.2 %  Comprehensive metabolic panel     Status: Abnormal   Collection Time: 01/24/23  5:45 AM  Result Value Ref Range   Sodium 135 135 - 145 mmol/L   Potassium 3.9 3.5 - 5.1 mmol/L   Chloride 98 98 - 111 mmol/L   CO2 20 (L) 22 - 32 mmol/L   Glucose, Bld 94 70 - 99 mg/dL   BUN 8 6 - 20 mg/dL   Creatinine, Ser 7.82 0.44 - 1.00 mg/dL   Calcium 9.2 8.9 - 95.6 mg/dL   Total Protein 7.5 6.5 - 8.1 g/dL   Albumin 2.8 (L) 3.5 - 5.0 g/dL   AST 41 15 - 41 U/L   ALT 59 (H) 0 - 44 U/L   Alkaline Phosphatase 178 (H) 38 - 126 U/L   Total Bilirubin 0.3 0.3 - 1.2 mg/dL   GFR, Estimated >21 >30 mL/min   Anion gap 17 (H) 5 - 15  Urinalysis, Routine w reflex microscopic -Urine, Clean Catch     Status: Abnormal   Collection Time: 01/24/23  5:46 AM  Result Value Ref Range   Color, Urine STRAW (A) YELLOW   APPearance CLEAR CLEAR   Specific Gravity, Urine 1.010 1.005 - 1.030   pH 6.0 5.0 - 8.0   Glucose, UA NEGATIVE NEGATIVE mg/dL   Hgb urine dipstick  NEGATIVE NEGATIVE   Bilirubin Urine NEGATIVE NEGATIVE   Ketones, ur NEGATIVE NEGATIVE mg/dL   Protein, ur NEGATIVE NEGATIVE mg/dL   Nitrite NEGATIVE NEGATIVE   Leukocytes,Ua SMALL  (A) NEGATIVE   RBC / HPF 0-5 0 - 5 RBC/hpf   WBC, UA 0-5 0 - 5 WBC/hpf   Bacteria, UA RARE (A) NONE SEEN   Squamous Epithelial / HPF 6-10 0 - 5 /HPF   Mucus PRESENT   Protein / creatinine ratio, urine     Status: None   Collection Time: 01/24/23  5:46 AM  Result Value Ref Range   Creatinine, Urine 41 mg/dL   Total Protein, Urine <6 mg/dL   Protein Creatinine Ratio        0.00 - 0.15 mg/mg[Cre]  Brain natriuretic peptide     Status: None   Collection Time: 01/24/23  6:31 AM  Result Value Ref Range   B Natriuretic Peptide 34.3 0.0 - 100.0 pg/mL  Troponin I (High Sensitivity)     Status: None   Collection Time: 01/24/23  6:31 AM  Result Value Ref Range   Troponin I (High Sensitivity) 2 <18 ng/L  Protime-INR     Status: None   Collection Time: 01/24/23  6:31 AM  Result Value Ref Range   Prothrombin Time 13.5 11.4 - 15.2 seconds   INR 1.0 0.8 - 1.2  APTT     Status: None   Collection Time: 01/24/23  6:31 AM  Result Value Ref Range   aPTT 27 24 - 36 seconds  Fibrinogen     Status: Abnormal   Collection Time: 01/24/23  6:31 AM  Result Value Ref Range   Fibrinogen 595 (H) 210 - 475 mg/dL  Troponin I (High Sensitivity)     Status: None   Collection Time: 01/24/23  8:13 AM  Result Value Ref Range   Troponin I (High Sensitivity) <2 <18 ng/L   CT Angio Chest PE W and/or Wo Contrast  Result Date: 01/24/2023 CLINICAL DATA:  27 year old pregnant female presenting with labored breathing and back pain. EXAM: CT ANGIOGRAPHY CHEST WITH CONTRAST TECHNIQUE: Multidetector CT imaging of the chest was performed using the standard protocol during bolus administration of intravenous contrast. Multiplanar CT image reconstructions and MIPs were obtained to evaluate the vascular anatomy. RADIATION DOSE REDUCTION: This exam was performed according to the departmental dose-optimization program which includes automated exposure control, adjustment of the mA and/or kV according to patient size and/or use of  iterative reconstruction technique. CONTRAST:  75mL OMNIPAQUE IOHEXOL 350 MG/ML SOLN COMPARISON:  No priors. FINDINGS: Cardiovascular: No filling defects are noted within the pulmonary arterial tree to suggest pulmonary embolism. Heart size is mildly enlarged. There is no significant pericardial fluid, thickening or pericardial calcification. No atherosclerotic calcifications are noted in the thoracic aorta or the coronary arteries. Dilatation of the pulmonic trunk (4.1 cm in diameter), suggesting pulmonary arterial hypertension. Mediastinum/Nodes: No pathologically enlarged mediastinal or hilar lymph nodes. Esophagus is unremarkable in appearance. No axillary lymphadenopathy. Lungs/Pleura: Patchy areas of subsegmental atelectasis are noted in the lung bases bilaterally. No confluent consolidative airspace disease. No pleural effusions. No suspicious appearing pulmonary nodules or masses are noted. Upper Abdomen: Unremarkable. Musculoskeletal: There are no aggressive appearing lytic or blastic lesions noted in the visualized portions of the skeleton. Review of the MIP images confirms the above findings. IMPRESSION: 1. No acute findings to account for the patient's symptoms. Specifically, no evidence of pulmonary embolism. 2. Mild cardiomegaly. 3. Dilatation of the pulmonic trunk (4.1 cm  in diameter), concerning for pulmonary arterial hypertension. Electronically Signed   By: Trudie Reed M.D.   On: 01/24/2023 08:11    MDM PE Labs: CBC, CMP, BNP, APTT, INR, Fibrinogen EFM Pain Medication CT Scan Assessment and Plan  27 year old G5P3013  SIUP at 34.3 weeks Cat I FT Back Pain Chest Pain  -POC Reviewed -Exam performed and findings discussed. -Labs ordered -Pain medication offered and accepted. -Flexeril and tylenol ordered. -Discussed obtaining XRay for c/o SOB, but will consult with MD regarding best practice. -Patient agreeable. -NST reactive thus far.  -Monitor and reassess.   Gerrit Heck, MSN, CNM  Reassessment  -Dr. Marquis Lunch. Anyanwu consulted and informed of patient status, evaluation, interventions, and results. Advised: *Considering worsening of symptoms would repeat all previous imaging and labs. -Give .5mg  Ativan if needed for anxiety.  -Provider back to bedside to discuss. -Reviewed risks of CT scan in pregnancy.  Patient verbalizes understanding. Consents signed. -Start IV and obtain additional labs. -Monitor and reassess.   Reassessment   -Dr. Macon Large calls and states labs reviewed. *Notes no significant findings and improvements noted.  *Advised EKG for continued tachycardia. -Order placed. -Report given to oncoming provider: Tyler Aas, CNM for assumption of care.   Gerrit Heck, MSN, CNM Advanced Practice Provider, [facility]  Assumed care of patient at [phone]. On review of labs and repeat CT, all appeared WNL - abnormal things reviewed with Dr. Despina Hidden who came to see patient and diagnosed her with paraspinous back spasms due to pinpoint reactivity on bilateral paraspinous spaces from cervical to lumbar. Advised patient on stretches, heat therapy and use of Biofreeze. Also advised alternating flexeril and valium for the next two days to allow her spine to relax. Pt expressed understanding, stable for discharge home.   Spasm of thoracic back muscle - Plan: Discharge patient  [redacted] weeks gestation of pregnancy  NST (non-stress test) reactive   Allergies as of 01/24/2023   No Known Allergies      Medication List     TAKE these medications    aspirin EC 81 MG tablet Take 2 tablets (162 mg total) by mouth daily. Swallow whole.   Blood Pressure Monitor Misc For regular home bp monitoring during pregnancy   cyclobenzaprine 10 MG tablet Commonly known as: FLEXERIL Take 1 tablet (10 mg total) by mouth every 8 (eight) hours as needed for muscle spasms.   diazepam 2 MG tablet Commonly known as: Valium Take 1 tablet (2 mg total) by mouth  every 6 (six) hours as needed for anxiety.   Doxylamine-Pyridoxine 10-10 MG Tbec Commonly known as: Diclegis Take 2 qhs; may also take one in am and one in afternoon prn nausea   flintstones complete 60 MG chewable tablet Chew 2 tablets by mouth daily.   ondansetron 4 MG disintegrating tablet Commonly known as: ZOFRAN-ODT Take 1 tablet (4 mg total) by mouth every 6 (six) hours as needed for nausea.   pantoprazole 20 MG tablet Commonly known as: Protonix Take 1 tablet (20 mg total) by mouth daily.       Edd Arbour, CNM, MSN, IBCLC Certified Nurse Midwife, [facility]

## 2023-01-24 NOTE — MAU Note (Signed)
Pt says she was admitted x3 days for pain in chest . D/C on Wed -All pain was still there. Worse yesterday at 6pm- took XS Tyl 2 tabs at 2300- no relief.  Feels baby move Pain 10/10- constant in upper back - hurts to breathe

## 2023-01-24 NOTE — Progress Notes (Signed)
Patient transported to CT via wheelchair by transport.

## 2023-01-28 ENCOUNTER — Encounter: Payer: Medicaid Other | Admitting: Advanced Practice Midwife

## 2023-02-23 ENCOUNTER — Ambulatory Visit (INDEPENDENT_AMBULATORY_CARE_PROVIDER_SITE_OTHER): Payer: BC Managed Care – PPO | Admitting: Family Medicine

## 2023-02-23 ENCOUNTER — Encounter (HOSPITAL_COMMUNITY): Payer: Self-pay | Admitting: Obstetrics and Gynecology

## 2023-02-23 ENCOUNTER — Encounter: Payer: Self-pay | Admitting: Family Medicine

## 2023-02-23 ENCOUNTER — Other Ambulatory Visit (HOSPITAL_COMMUNITY)
Admission: RE | Admit: 2023-02-23 | Discharge: 2023-02-23 | Disposition: A | Discharge status: Home / Self Care | Payer: BC Managed Care – PPO | Source: Ambulatory Visit | Attending: Obstetrics & Gynecology | Admitting: Obstetrics & Gynecology

## 2023-02-23 ENCOUNTER — Inpatient Hospital Stay (HOSPITAL_COMMUNITY)
Admission: AD | Admit: 2023-02-23 | Discharge: 2023-02-25 | DRG: 807 | Disposition: A | Discharge status: Home / Self Care | Payer: BC Managed Care – PPO | Attending: Obstetrics and Gynecology | Admitting: Obstetrics and Gynecology

## 2023-02-23 ENCOUNTER — Other Ambulatory Visit (HOSPITAL_COMMUNITY): Admit: 2023-02-23 | Payer: BC Managed Care – PPO

## 2023-02-23 VITALS — BP 131/89 | HR 119 | Wt 223.2 lb

## 2023-02-23 DIAGNOSIS — Z3490 Encounter for supervision of normal pregnancy, unspecified, unspecified trimester: Secondary | ICD-10-CM

## 2023-02-23 DIAGNOSIS — Z8759 Personal history of other complications of pregnancy, childbirth and the puerperium: Secondary | ICD-10-CM

## 2023-02-23 DIAGNOSIS — Z5986 Financial insecurity: Secondary | ICD-10-CM

## 2023-02-23 DIAGNOSIS — Z3A38 38 weeks gestation of pregnancy: Secondary | ICD-10-CM

## 2023-02-23 DIAGNOSIS — O99214 Obesity complicating childbirth: Secondary | ICD-10-CM | POA: Diagnosis present

## 2023-02-23 DIAGNOSIS — Z87891 Personal history of nicotine dependence: Secondary | ICD-10-CM

## 2023-02-23 DIAGNOSIS — Z3483 Encounter for supervision of other normal pregnancy, third trimester: Secondary | ICD-10-CM | POA: Diagnosis not present

## 2023-02-23 DIAGNOSIS — O3663X0 Maternal care for excessive fetal growth, third trimester, not applicable or unspecified: Secondary | ICD-10-CM | POA: Diagnosis present

## 2023-02-23 DIAGNOSIS — O134 Gestational [pregnancy-induced] hypertension without significant proteinuria, complicating childbirth: Principal | ICD-10-CM | POA: Diagnosis present

## 2023-02-23 DIAGNOSIS — O139 Gestational [pregnancy-induced] hypertension without significant proteinuria, unspecified trimester: Secondary | ICD-10-CM | POA: Diagnosis present

## 2023-02-23 LAB — POCT URINALYSIS DIPSTICK OB
Blood, UA: NEGATIVE
Glucose, UA: NEGATIVE
Ketones, UA: NEGATIVE
Nitrite, UA: NEGATIVE
POC,PROTEIN,UA: NEGATIVE

## 2023-02-23 LAB — COMPREHENSIVE METABOLIC PANEL
ALT: 23 U/L (ref 0–44)
AST: 19 U/L (ref 15–41)
Albumin: 2.7 g/dL — ABNORMAL LOW (ref 3.5–5.0)
Alkaline Phosphatase: 218 U/L — ABNORMAL HIGH (ref 38–126)
Anion gap: 12 (ref 5–15)
BUN: 11 mg/dL (ref 6–20)
CO2: 20 mmol/L — ABNORMAL LOW (ref 22–32)
Calcium: 8.9 mg/dL (ref 8.9–10.3)
Chloride: 102 mmol/L (ref 98–111)
Creatinine, Ser: 0.65 mg/dL (ref 0.44–1.00)
GFR, Estimated: >60 mL/min (ref >60)
Glucose, Bld: 83 mg/dL (ref 70–99)
Potassium: 3.7 mmol/L (ref 3.5–5.1)
Sodium: 134 mmol/L — ABNORMAL LOW (ref 135–145)
Total Bilirubin: 0.3 mg/dL (ref 0.3–1.2)
Total Protein: 6.6 g/dL (ref 6.5–8.1)

## 2023-02-23 LAB — CBC
HCT: 33.9 % — ABNORMAL LOW (ref 36.0–46.0)
Hemoglobin: 11.5 g/dL — ABNORMAL LOW (ref 12.0–15.0)
MCH: 29.3 pg (ref 26.0–34.0)
MCHC: 33.9 g/dL (ref 30.0–36.0)
MCV: 86.5 fL (ref 80.0–100.0)
Platelets: 176 10*3/uL (ref 150–400)
RBC: 3.92 MIL/uL (ref 3.87–5.11)
RDW: 14.9 % (ref 11.5–15.5)
WBC: 10.7 10*3/uL — ABNORMAL HIGH (ref 4.0–10.5)
nRBC: 0 % (ref 0.0–0.2)

## 2023-02-23 LAB — TYPE AND SCREEN
ABO/RH(D): O POS
Antibody Screen: NEGATIVE

## 2023-02-23 LAB — PROTEIN / CREATININE RATIO, URINE
Creatinine, Urine: 153 mg/dL
Protein Creatinine Ratio: 0.12 mg/mg{Cre} (ref 0.00–0.15)
Total Protein, Urine: 18 mg/dL

## 2023-02-23 MED ORDER — OXYTOCIN BOLUS FROM INFUSION
333.0000 mL | Freq: Once | INTRAVENOUS | Status: AC
  Administered 2023-02-24: 333 mL via INTRAVENOUS

## 2023-02-23 MED ORDER — MISOPROSTOL 50MCG HALF TABLET
50.0000 ug | ORAL_TABLET | Freq: Once | ORAL | Status: AC
  Administered 2023-02-23: 50 ug via ORAL
  Filled 2023-02-23: qty 1

## 2023-02-23 MED ORDER — MISOPROSTOL 25 MCG QUARTER TABLET
25.0000 ug | ORAL_TABLET | Freq: Once | ORAL | Status: AC
  Administered 2023-02-23: 25 ug via VAGINAL
  Filled 2023-02-23: qty 1

## 2023-02-23 MED ORDER — OXYCODONE-ACETAMINOPHEN 5-325 MG PO TABS: 1.0000 | ORAL_TABLET | ORAL | Status: DC | PRN

## 2023-02-23 MED ORDER — ONDANSETRON HCL 4 MG/2ML IJ SOLN: 4.0000 mg | Freq: Four times a day (QID) | INTRAMUSCULAR | Status: DC | PRN

## 2023-02-23 MED ORDER — OXYTOCIN-SODIUM CHLORIDE 30-0.9 UT/500ML-% IV SOLN
1.0000 m[IU]/min | INTRAVENOUS | Status: DC
  Administered 2023-02-24: 2 m[IU]/min via INTRAVENOUS
  Filled 2023-02-23: qty 1000

## 2023-02-23 MED ORDER — LIDOCAINE HCL (PF) 1 % IJ SOLN: 30.0000 mL | INTRAMUSCULAR | Status: DC | PRN

## 2023-02-23 MED ORDER — ACETAMINOPHEN 325 MG PO TABS
650.0000 mg | ORAL_TABLET | ORAL | Status: DC | PRN
  Administered 2023-02-24: 650 mg via ORAL
  Filled 2023-02-23: qty 2

## 2023-02-23 MED ORDER — OXYCODONE-ACETAMINOPHEN 5-325 MG PO TABS: 2.0000 | ORAL_TABLET | ORAL | Status: DC | PRN

## 2023-02-23 MED ORDER — HYDROXYZINE HCL 50 MG PO TABS: 50.0000 mg | ORAL_TABLET | Freq: Four times a day (QID) | ORAL | Status: DC | PRN

## 2023-02-23 MED ORDER — FENTANYL CITRATE (PF) 100 MCG/2ML IJ SOLN: 50.0000 ug | INTRAMUSCULAR | Status: DC | PRN

## 2023-02-23 MED ORDER — TERBUTALINE SULFATE 1 MG/ML IJ SOLN: 0.2500 mg | Freq: Once | INTRAMUSCULAR | Status: DC | PRN

## 2023-02-23 MED ORDER — SOD CITRATE-CITRIC ACID 500-334 MG/5ML PO SOLN: 30.0000 mL | ORAL | Status: DC | PRN

## 2023-02-23 MED ORDER — OXYTOCIN-SODIUM CHLORIDE 30-0.9 UT/500ML-% IV SOLN: 2.5000 [IU]/h | INTRAVENOUS | Status: DC

## 2023-02-23 MED ORDER — LACTATED RINGERS IV SOLN: INTRAVENOUS | Status: DC

## 2023-02-23 MED ORDER — LACTATED RINGERS IV SOLN: 500.0000 mL | INTRAVENOUS | Status: DC | PRN

## 2023-02-23 NOTE — MAU Provider Note (Signed)
History     CSN: [id number]  Arrival date and time: 02/23/23 1959   None     Chief Complaint  Patient presents with   Hypertension   HPI Carla Rose is a 27 y.o. G5P3013 at [redacted]w[redacted]d who presents to MAU for elevated BP's. Patient was seen in the office today and BP was 140/87. Her repeat BP was normal. She denies headache, vision changes, and RUQ/epigastric pain. She reports some contractions. No vaginal bleeding or leaking fluid. She reports normal fetal movement.  Pregnancy course: Receives [facility]. Hx of gHTN and ppHTN in prior pregnancies.  OB History     Gravida  5   Para  3   Term  3   Preterm      AB  1   Living  3      SAB      IAB      Ectopic  1   Multiple  0   Live Births  3           Past Medical History:  Diagnosis Date   Anemia    Chlamydia    Gonorrhea    Hypertension    Pregnancy induced hypertension     Past Surgical History:  Procedure Laterality Date   LAPAROSCOPIC UNILATERAL SALPINGECTOMY Left 01/30/2022   Procedure: LEFT LAPAROSCOPIC SALPINGECTOMY WITH REMOVAL OF ECTOPIC PREGNANCY;  Surgeon: Adam Phenix, MD;  Location: [facility] OR;  Service: Gynecology;  Laterality: Left;   NO PAST SURGERIES      Family History  Problem Relation Age of Onset   Crohn's disease Father    Crohn's disease Sister    Heart disease Paternal Grandmother     Social History   Tobacco Use   Smoking status: Former    Current packs/day: 5.00    Types: Cigars, Cigarettes   Smokeless tobacco: Never  Vaping Use   Vaping status: Never Used  Substance Use Topics   Alcohol use: No   Drug use: No    Allergies: No Known Allergies  Medications Prior to Admission  Medication Sig Dispense Refill Last Dose   flintstones complete (FLINTSTONES) 60 MG chewable tablet Chew 2 tablets by mouth daily.   02/22/2023   Blood Pressure Monitor MISC For regular home bp monitoring during pregnancy 1 each 0    Review of Systems  All other systems  reviewed and are negative.  Physical Exam   Patient Vitals for the past 24 hrs:  BP Temp Temp src Pulse Resp SpO2 Height Weight  02/23/23 2116 136/85 -- -- (!) 112 -- -- -- --  02/23/23 2100 (!) 127/92 -- -- (!) 115 -- 98 % -- --  02/23/23 2046 119/80 -- -- (!) 127 -- -- -- --  02/23/23 2040 -- -- -- -- -- 97 % -- --  02/23/23 2037 133/86 -- -- (!) 135 -- -- -- --  02/23/23 2012 126/80 98.3 F (36.8 C) Oral (!) 133 20 99 % 5\' 8"  (1.727 m) 101.7 kg   Physical Exam Vitals and nursing note reviewed.  Constitutional:      General: She is not in acute distress. Eyes:     Extraocular Movements: Extraocular movements intact.     Pupils: Pupils are equal, round, and reactive to light.  Cardiovascular:     Rate and Rhythm: Tachycardia present.  Pulmonary:     Effort: Pulmonary effort is normal. No respiratory distress.  Abdominal:     Palpations: Abdomen is soft.  Tenderness: There is no abdominal tenderness.     Comments: Gravid    Musculoskeletal:        General: Normal range of motion.     Cervical back: Normal range of motion.  Skin:    General: Skin is warm and dry.  Neurological:     General: No focal deficit present.     Mental Status: She is alert and oriented to person, place, and time.  Psychiatric:        Mood and Affect: Mood normal.        Behavior: Behavior normal.    BSUS: Vertex   NST FHR: 135 bpm, moderate variability, +15x15 accels, no decels Toco: occasional   Results for orders placed or performed during the hospital encounter of 02/23/23 (from the past 24 hour(s))  Protein / creatinine ratio, urine     Status: None   Collection Time: 02/23/23  8:29 PM  Result Value Ref Range   Creatinine, Urine 153 mg/dL   Total Protein, Urine 18 mg/dL   Protein Creatinine Ratio 0.12 0.00 - 0.15 mg/mg[Cre]   MAU Course  Procedures  MDM CBC, CMP, UPCR NST  Reviewed chart. Patient noted to have several elevated BP's on prior visits ranging 130s-140s/90s.  Patient meets criteria for gHTN, recommend IOL. Patient agreeable with plan of care.   Assessment and Plan  [redacted] weeks gestation of pregnancy Gestational hypertension  - Admit to L&D for IOL - L&D team notified  Brand Males, CNM 02/23/2023, 9:24 PM

## 2023-02-23 NOTE — MAU Note (Signed)
.  Carla Rose is a 27 y.o. at [redacted]w[redacted]d here in MAU reporting: sent from the office for HBP evaluation. Pt states her bp were 140s 130s in the office. Pt denies PIH s/s. Pt endorses FM+. Pt denies VB, LOF, and no other complications. Pt states she was only on ASA in the beginning of the pregnancy.  Hx of GHTN and PPHTN Pt was tachycardic in triage 130-140s Baby measuring 44wks LGA Onset of complaint: today Pain score: 0/10 Vitals:   02/23/23 2012  BP: 126/80  Pulse: (!) 133  Resp: 20  Temp: 98.3 F (36.8 C)  SpO2: 99%     FHT:130  Lab orders placed from triage:  UA

## 2023-02-23 NOTE — Progress Notes (Signed)
   PRENATAL VISIT NOTE  Subjective:  Carla Rose is a 27 y.o. [phone] at [redacted]w[redacted]d being seen today for ongoing prenatal care.  She is currently monitored for the following issues for this low-risk pregnancy and has History of gestational hypertension & PPHTN; Abnormal Pap smear of cervix; Supervision of normal pregnancy; and History of ectopic pregnancy on their problem list.  Patient reports  intermittent contractions mostly at night .  Contractions: Not present. Vag. Bleeding: None.  Movement: Present. Denies leaking of fluid.   The following portions of the patient's history were reviewed and updated as appropriate: allergies, current medications, past family history, past medical history, past social history, past surgical history and problem list.   Objective:   Vitals:   02/23/23 1538 02/23/23 1601  BP: (!) 140/87 131/89  Pulse: (!) 104 (!) 119  Weight: 223 lb 3.2 oz (101.2 kg)     Fetal Status: Fetal Heart Rate (bpm): 140 Fundal Height: 44 cm Movement: Present     General:  Alert, oriented and cooperative. Patient is in no acute distress.  Skin: Skin is warm and dry. No rash noted.   Cardiovascular: Normal heart rate noted  Respiratory: Normal respiratory effort, no problems with respiration noted  Abdomen: Soft, gravid, appropriate for gestational age.  Pain/Pressure: Present     Pelvic: Cervical exam performed in the presence of a chaperone       Unable to determine due to very high cervix  Extremities: Normal range of motion.  Edema: Trace  Mental Status: Normal mood and affect. Normal behavior. Normal judgment and thought content.   Assessment and Plan:  Pregnancy: [id number] at [redacted]w[redacted]d 1. Encounter for supervision of other normal pregnancy in third trimester - Cervicovaginal ancillary only( [place]) - Strep Gp B NAA  2. [redacted] weeks gestation of pregnancy - Cervicovaginal ancillary only( [place]) - Strep Gp B NAA  3. History of gestational hypertension & PPHTN BP  elevated today. Sent to MAU for further evaluation and preE labs. No proteinuria on dipstick in the office. Last elevated was 6/29.   4. Fundal height high for dates Possible LGA. EFW 93% @34  w/ AC >99%.    Term labor symptoms and general obstetric precautions including but not limited to vaginal bleeding, contractions, leaking of fluid and fetal movement were reviewed in detail with the patient. Please refer to After Visit Summary for other counseling recommendations.   No follow-ups on file.  Future Appointments  Date Time Provider Department Center  03/03/2023 11:10 AM Cheral Marker, CNM [facility] [place], DO

## 2023-02-23 NOTE — H&P (Signed)
OBSTETRIC ADMISSION HISTORY AND PHYSICAL  Carla Rose is a 27 y.o. female [phone] with IUP at [redacted]w[redacted]d by LMP presenting for gHTN. She was seen earlier today in the office and noted to have elevated blood pressures. Chart review shows intermittent elevated blood pressures during pregnancy, hence she meets criteria for gHTN. Labs done in [facility] are negative for pre-eclampsia. She reports +FMs, No LOF, no VB, no blurry vision, headaches or peripheral edema, and RUQ pain.  She plans on breast and bottle feeding. She request phexxi for birth control. She received her prenatal care at [facility]   Dating: By LMP --->  Estimated Date of Delivery: 03/04/23  Sono:    @[redacted]w[redacted]d , CWD, normal anatomy, cephalic presentation, 2823g, 44% EFW   Prenatal History/Complications:  Obesity in pregnancy History of pre-eclampsia, gHTN LGA, with AC >99% at 34 weeks.    FAMILY TREE   RESULTS  Language English Pap 10/28/21: ASCUS w/ -HRHPV  Initiated care at 11wks GC/CT Initial:   -/-         36wks:  Dating by LMP c/w 6wk u/s      Support person   Genetics declined  BP cuff rx'd Carrier Screen declined      [place]/Hgb Elec    Rhogam N/a      TDaP vaccine 01/13/23  Blood Type O/Positive/-- (01/23 1122)  Flu vaccine   Antibody Negative (01/23 1122)  Covid vaccine   HBsAg Negative (01/23 1122)      RPR Non Reactive (01/23 1122)  Anatomy US Normal female 'Lede' Rubella  1.63 (01/23 1122)  Feeding Plan both HIV Non Reactive (01/23 1122)  Contraception Phexxi Hep C neg  Circumcision N/a      Pediatrician List given A1C/GTT Early:      26-28wks:  Prenatal Classes discussed          GBS     [ ]  PCN allergy  BTL Consent        VBAC Consent   PHQ9 & GAD7  [ ] New OB  [ ] 28wks   [ ] 36wks  Waterbirth [ ] Class [ ]  36wkCNM visit/consent         Past Medical History: Past Medical History:  Diagnosis Date   Anemia    Chlamydia    Gonorrhea    Hypertension    Pregnancy induced hypertension     Past Surgical  History: Past Surgical History:  Procedure Laterality Date   LAPAROSCOPIC UNILATERAL SALPINGECTOMY Left 01/30/2022   Procedure: LEFT LAPAROSCOPIC SALPINGECTOMY WITH REMOVAL OF ECTOPIC PREGNANCY;  Surgeon: Adam Phenix, MD;  Location: [facility];  Service: Gynecology;  Laterality: Left;   NO PAST SURGERIES      Obstetrical History: OB History     Gravida  5   Para  3   Term  3   Preterm      AB  1   Living  3      SAB      IAB      Ectopic  1   Multiple  0   Live Births  3           Social History Social History   Socioeconomic History   Marital status: Single    Spouse name: Not on file   Number of children: 3   Years of education: Not on file   Highest education level: Not on file  Occupational History   Not on file  Tobacco Use   Smoking status: Former    Current  packs/day: 5.00    Types: Cigars, Cigarettes   Smokeless tobacco: Never  Vaping Use   Vaping status: Never Used  Substance and Sexual Activity   Alcohol use: No   Drug use: No   Sexual activity: Not Currently    Birth control/protection: None  Other Topics Concern   Not on file  Social History Narrative   Not on file   Social Determinants of Health   Financial Resource Strain: Medium Risk (08/18/2022)   Overall Financial Resource Strain (CARDIA)    Difficulty of Paying Living Expenses: Somewhat hard  Food Insecurity: No Food Insecurity (01/21/2023)   Hunger Vital Sign    Worried About Running Out of Food in the Last Year: Never true    Ran Out of Food in the Last Year: Never true  Transportation Needs: No Transportation Needs (01/21/2023)   PRAPARE - Administrator, Civil Service (Medical): No    Lack of Transportation (Non-Medical): No  Physical Activity: Inactive (08/18/2022)   Exercise Vital Sign    Days of Exercise per Week: 0 days    Minutes of Exercise per Session: 0 min  Stress: Stress Concern Present (08/18/2022)   [employer] of Occupational Health -  Occupational Stress Questionnaire    Feeling of Stress : Rather much  Social Connections: Socially Isolated (08/18/2022)   Social Connection and Isolation Panel [NHANES]    Frequency of Communication with Friends and Family: Never    Frequency of Social Gatherings with Friends and Family: Never    Attends Religious Services: Never    Database administrator or Organizations: No    Attends Engineer, structural: Never    Marital Status: Living with partner    Family History: Family History  Problem Relation Age of Onset   Crohn's disease Father    Crohn's disease Sister    Heart disease Paternal Grandmother     Allergies: No Known Allergies  Medications Prior to Admission  Medication Sig Dispense Refill Last Dose   flintstones complete (FLINTSTONES) 60 MG chewable tablet Chew 2 tablets by mouth daily.   02/22/2023   Blood Pressure Monitor MISC For regular home bp monitoring during pregnancy 1 each 0      Review of Systems   All systems reviewed and negative except as stated in HPI  Blood pressure (!) 127/95, pulse (!) 106, temperature 98.4 F (36.9 C), temperature source Oral, resp. rate 16, height 5\' 8"  (1.727 m), weight 101.7 kg, last menstrual period 05/28/2022, SpO2 98%.  General appearance: alert, cooperative, and appears stated age Lungs: clear to auscultation bilaterally Heart: regular rate and rhythm Abdomen: soft, non-tender; bowel sounds normal Pelvic: see below Extremities: Homans sign is negative, no sign of DVT Presentation: cephalic  Fetal monitoring: 135 bpm, moderate variability, + accelerations, no decelerations  Prenatal labs: ABO, Rh: --/--/O POS (06/25 1841) Antibody: NEG (06/25 1841) Rubella: 1.63 (01/23 1122) RPR: Non Reactive (01/23 1122)  HBsAg: Negative (01/23 1122)  HIV: Non Reactive (01/23 1122)  GBS:   PCR pending 2 hr Glucola normal Genetic screening: declined Anatomy US normal  Prenatal Transfer Tool  Maternal Diabetes:  No Genetic Screening: Declined Maternal Ultrasounds/Referrals: Normal Fetal Ultrasounds or other Referrals:  None Maternal Substance Abuse:  No Significant Maternal Medications:  None Significant Maternal Lab Results:  Other: GBS pending Number of Prenatal Visits:greater than 3 verified prenatal visits Other Comments:  None  Results for orders placed or performed during the hospital encounter of 02/23/23 (from the past  24 hour(s))  Protein / creatinine ratio, urine   Collection Time: 02/23/23  8:29 PM  Result Value Ref Range   Creatinine, Urine 153 mg/dL   Total Protein, Urine 18 mg/dL   Protein Creatinine Ratio 0.12 0.00 - 0.15 mg/mg[Cre]  CBC   Collection Time: 02/23/23  9:33 PM  Result Value Ref Range   WBC 10.7 (H) 4.0 - 10.5 K/uL   RBC 3.92 3.87 - 5.11 MIL/uL   Hemoglobin 11.5 (L) 12.0 - 15.0 g/dL   HCT 16.1 (L) 09.6 - 04.5 %   MCV 86.5 80.0 - 100.0 fL   MCH 29.3 26.0 - 34.0 pg   MCHC 33.9 30.0 - 36.0 g/dL   RDW 40.9 81.1 - 91.4 %   Platelets 176 150 - 400 K/uL   nRBC 0.0 0.0 - 0.2 %  Results for orders placed or performed in visit on 02/23/23 (from the past 24 hour(s))  POC Urinalysis Dipstick OB   Collection Time: 02/23/23  3:45 PM  Result Value Ref Range   Color, UA     Clarity, UA     Glucose, UA Negative Negative   Bilirubin, UA     Ketones, UA neg    Spec Grav, UA     Blood, UA neg    pH, UA     POC,PROTEIN,UA Negative Negative, Trace, Small (1+), Moderate (2+), Large (3+), 4+   Urobilinogen, UA     Nitrite, UA negative    Leukocytes, UA Trace (A) Negative   Appearance     Odor      Patient Active Problem List   Diagnosis Date Noted   Gestational hypertension 02/23/2023   Supervision of normal pregnancy 08/18/2022   History of ectopic pregnancy 08/18/2022   Abnormal Pap smear of cervix 11/05/2021   History of gestational hypertension & PPHTN 02/25/2018    Assessment/Plan:  Carla Rose is a 27 y.o. [id number] at [redacted]w[redacted]d here for IOL due to gHTN.  Labs negative for pre-eclampsia. No symptosms  #Labor:admit to L &D for IOL. Start IOL with cytotec 50PO/25 va. Consider FB if needed at next check. #Pain: Family support, IV fentanyl, epidural when ready  #FWB: Cat 1 #ID:  GBS pending. No PCN for ppx #MOF: both #MOC:phexxi #Circ:  N/a - female  Sheppard Evens MD MPH OB Fellow, Faculty Practice [facility], Center for [facility] 02/24/2023

## 2023-02-24 ENCOUNTER — Inpatient Hospital Stay (HOSPITAL_COMMUNITY): Payer: BC Managed Care – PPO | Admitting: Anesthesiology

## 2023-02-24 ENCOUNTER — Encounter (HOSPITAL_COMMUNITY): Payer: Self-pay | Admitting: Obstetrics and Gynecology

## 2023-02-24 DIAGNOSIS — Z3A38 38 weeks gestation of pregnancy: Secondary | ICD-10-CM

## 2023-02-24 DIAGNOSIS — O3663X0 Maternal care for excessive fetal growth, third trimester, not applicable or unspecified: Secondary | ICD-10-CM

## 2023-02-24 DIAGNOSIS — O99214 Obesity complicating childbirth: Secondary | ICD-10-CM

## 2023-02-24 DIAGNOSIS — O134 Gestational [pregnancy-induced] hypertension without significant proteinuria, complicating childbirth: Secondary | ICD-10-CM

## 2023-02-24 LAB — CBC
HCT: 33 % — ABNORMAL LOW (ref 36.0–46.0)
Hemoglobin: 11.2 g/dL — ABNORMAL LOW (ref 12.0–15.0)
MCH: 29.9 pg (ref 26.0–34.0)
MCHC: 33.9 g/dL (ref 30.0–36.0)
MCV: 88.2 fL (ref 80.0–100.0)
Platelets: 181 10*3/uL (ref 150–400)
RBC: 3.74 MIL/uL — ABNORMAL LOW (ref 3.87–5.11)
RDW: 14.9 % (ref 11.5–15.5)
WBC: 11.4 10*3/uL — ABNORMAL HIGH (ref 4.0–10.5)
nRBC: 0 % (ref 0.0–0.2)

## 2023-02-24 MED ORDER — BENZOCAINE-MENTHOL 20-0.5 % EX AERO: 1.0000 | INHALATION_SPRAY | CUTANEOUS | Status: DC | PRN

## 2023-02-24 MED ORDER — SODIUM CHLORIDE 0.9% FLUSH
3.0000 mL | Freq: Two times a day (BID) | INTRAVENOUS | Status: DC
  Administered 2023-02-24: 3 mL via INTRAVENOUS

## 2023-02-24 MED ORDER — EPHEDRINE 5 MG/ML INJ: 10.0000 mg | INTRAVENOUS | Status: DC | PRN

## 2023-02-24 MED ORDER — LACTATED RINGERS IV SOLN: 500.0000 mL | Freq: Once | INTRAVENOUS | Status: DC

## 2023-02-24 MED ORDER — DIBUCAINE (PERIANAL) 1 % EX OINT: 1.0000 | TOPICAL_OINTMENT | CUTANEOUS | Status: DC | PRN

## 2023-02-24 MED ORDER — ONDANSETRON HCL 4 MG PO TABS: 4.0000 mg | ORAL_TABLET | ORAL | Status: DC | PRN

## 2023-02-24 MED ORDER — MISOPROSTOL 25 MCG QUARTER TABLET: 25.0000 ug | ORAL_TABLET | ORAL | Status: DC | PRN

## 2023-02-24 MED ORDER — LACTATED RINGERS IV SOLN
500.0000 mL | Freq: Once | INTRAVENOUS | Status: AC
  Administered 2023-02-24: 500 mL via INTRAVENOUS

## 2023-02-24 MED ORDER — PRENATAL MULTIVITAMIN CH
1.0000 | ORAL_TABLET | Freq: Every day | ORAL | Status: DC
  Administered 2023-02-24 – 2023-02-25 (×2): 1 via ORAL
  Filled 2023-02-24 (×2): qty 1

## 2023-02-24 MED ORDER — LIDOCAINE HCL (PF) 1 % IJ SOLN
INTRAMUSCULAR | Status: DC | PRN
  Administered 2023-02-24: 5 mL via EPIDURAL
  Administered 2023-02-24: 3 mL via EPIDURAL
  Administered 2023-02-24: 2 mL via EPIDURAL

## 2023-02-24 MED ORDER — FUROSEMIDE 20 MG PO TABS
20.0000 mg | ORAL_TABLET | Freq: Every day | ORAL | Status: DC
  Administered 2023-02-24 – 2023-02-25 (×2): 20 mg via ORAL
  Filled 2023-02-24 (×2): qty 1

## 2023-02-24 MED ORDER — PHENYLEPHRINE 80 MCG/ML (10ML) SYRINGE FOR IV PUSH (FOR BLOOD PRESSURE SUPPORT): 80.0000 ug | PREFILLED_SYRINGE | INTRAVENOUS | Status: DC | PRN

## 2023-02-24 MED ORDER — SENNOSIDES-DOCUSATE SODIUM 8.6-50 MG PO TABS
2.0000 | ORAL_TABLET | ORAL | Status: DC
  Administered 2023-02-24 – 2023-02-25 (×2): 2 via ORAL
  Filled 2023-02-24 (×3): qty 2

## 2023-02-24 MED ORDER — SIMETHICONE 80 MG PO CHEW: 80.0000 mg | CHEWABLE_TABLET | ORAL | Status: DC | PRN

## 2023-02-24 MED ORDER — ZOLPIDEM TARTRATE 5 MG PO TABS: 5.0000 mg | ORAL_TABLET | Freq: Every evening | ORAL | Status: DC | PRN

## 2023-02-24 MED ORDER — OXYCODONE HCL 5 MG PO TABS: 5.0000 mg | ORAL_TABLET | ORAL | Status: DC | PRN

## 2023-02-24 MED ORDER — SODIUM CHLORIDE 0.9 % IV SOLN: INTRAVENOUS | Status: DC | PRN

## 2023-02-24 MED ORDER — DIPHENHYDRAMINE HCL 50 MG/ML IJ SOLN: 12.5000 mg | INTRAMUSCULAR | Status: DC | PRN

## 2023-02-24 MED ORDER — COCONUT OIL OIL: 1.0000 | TOPICAL_OIL | Status: DC | PRN

## 2023-02-24 MED ORDER — ONDANSETRON HCL 4 MG/2ML IJ SOLN: 4.0000 mg | INTRAMUSCULAR | Status: DC | PRN

## 2023-02-24 MED ORDER — ACETAMINOPHEN 325 MG PO TABS: 650.0000 mg | ORAL_TABLET | ORAL | Status: DC | PRN

## 2023-02-24 MED ORDER — FENTANYL-BUPIVACAINE-NACL 0.5-0.125-0.9 MG/250ML-% EP SOLN
12.0000 mL/h | EPIDURAL | Status: DC | PRN
  Administered 2023-02-24: 12 mL/h via EPIDURAL
  Filled 2023-02-24: qty 250

## 2023-02-24 MED ORDER — SODIUM CHLORIDE 0.9% FLUSH: 3.0000 mL | INTRAVENOUS | Status: DC | PRN

## 2023-02-24 MED ORDER — IBUPROFEN 600 MG PO TABS
600.0000 mg | ORAL_TABLET | Freq: Four times a day (QID) | ORAL | Status: DC
  Administered 2023-02-24 – 2023-02-25 (×6): 600 mg via ORAL
  Filled 2023-02-24 (×6): qty 1

## 2023-02-24 MED ORDER — DIPHENHYDRAMINE HCL 25 MG PO CAPS: 25.0000 mg | ORAL_CAPSULE | Freq: Four times a day (QID) | ORAL | Status: DC | PRN

## 2023-02-24 MED ORDER — PHENYLEPHRINE 80 MCG/ML (10ML) SYRINGE FOR IV PUSH (FOR BLOOD PRESSURE SUPPORT)
80.0000 ug | PREFILLED_SYRINGE | INTRAVENOUS | Status: DC | PRN
  Filled 2023-02-24: qty 10

## 2023-02-24 MED ORDER — WITCH HAZEL-GLYCERIN EX PADS: 1.0000 | MEDICATED_PAD | CUTANEOUS | Status: DC | PRN

## 2023-02-24 NOTE — Discharge Summary (Signed)
Postpartum Discharge Summary  Date of Service updated7/31/24     Patient Name: Carla Rose DOB: 25-Jun-1996 MRN: [id number]  Date of admission: 02/23/2023 Delivery date:02/24/2023 Delivering provider: Alfredia Ferguson Date of discharge: 02/25/2023  Admitting diagnosis: Gestational hypertension [O13.9] Intrauterine pregnancy: [redacted]w[redacted]d     Secondary diagnosis:  Principal Problem:   Vaginal delivery Active Problems:   Supervision of normal pregnancy   History of ectopic pregnancy   Gestational hypertension  Additional problems: none    Discharge diagnosis: Term Pregnancy Delivered and Gestational Hypertension                                              Post partum procedures: none Augmentation: Pitocin and Cytotec Complications: None  Hospital course: Induction of Labor With Vaginal Delivery   27 y.o. yo [id number] at [redacted]w[redacted]d was admitted to the hospital 02/23/2023 for induction of labor.  Indication for induction: Gestational hypertension.  Patient had an labor course complicated by: none Membrane Rupture Time/Date: 6:10 AM,02/24/2023  Delivery Method:Vaginal, Spontaneous Operative Delivery:N/A Episiotomy: None Lacerations:  None Details of delivery can be found in separate delivery note.  Patient had a postpartum course complicated by nothing. Patient is discharged home 02/25/23.  Newborn Data: Birth date:02/24/2023 Birth time:6:43 AM Gender:Female Living status:Living Apgars:8 ,9  Weight:3990 g  Magnesium Sulfate received: No BMZ received: No Rhophylac:N/A MMR:N/A T-DaP:Given prenatally Flu: N/A Transfusion:No  Physical exam  Vitals:   02/24/23 1531 02/24/23 2059 02/25/23 0015 02/25/23 0500  BP: 127/88 120/72 111/84 110/75  Pulse: 61 72 77 67  Resp: 16 16 18 18   Temp: 98 F (36.7 C) 97.7 F (36.5 C) 97.7 F (36.5 C) 97.8 F (36.6 C)  TempSrc: Oral Oral Oral Oral  SpO2:    99%  Weight:      Height:       General: alert, cooperative, and no  distress Lochia: appropriate Uterine Fundus: firm Incision: N/A DVT Evaluation: No evidence of DVT seen on physical exam. Negative Homan's sign. No cords or calf tenderness. No significant calf/ankle edema. Labs: Lab Results  Component Value Date   WBC 11.4 (H) 02/24/2023   HGB 11.2 (L) 02/24/2023   HCT 33.0 (L) 02/24/2023   MCV 88.2 02/24/2023   PLT 181 02/24/2023      Latest Ref Rng & Units 02/23/2023    9:33 PM  CMP  Glucose 70 - 99 mg/dL 83   BUN 6 - 20 mg/dL 11   Creatinine 5.64 - 1.00 mg/dL 3.32   Sodium 951 - 884 mmol/L 134   Potassium 3.5 - 5.1 mmol/L 3.7   Chloride 98 - 111 mmol/L 102   CO2 22 - 32 mmol/L 20   Calcium 8.9 - 10.3 mg/dL 8.9   Total Protein 6.5 - 8.1 g/dL 6.6   Total Bilirubin 0.3 - 1.2 mg/dL 0.3   Alkaline Phos 38 - 126 U/L 218   AST 15 - 41 U/L 19   ALT 0 - 44 U/L 23    Edinburgh Score:    02/24/2023    3:31 PM  Edinburgh Postnatal Depression Scale Screening Tool  I have been able to laugh and see the funny side of things. 0  I have looked forward with enjoyment to things. 0  I have blamed myself unnecessarily when things went wrong. 1  I have been anxious or worried for no  good reason. 2  I have felt scared or panicky for no good reason. 0  Things have been getting on top of me. 0  I have been so unhappy that I have had difficulty sleeping. 0  I have felt sad or miserable. 0  I have been so unhappy that I have been crying. 1  The thought of harming myself has occurred to me. 0  Edinburgh Postnatal Depression Scale Total 4     After visit meds:  Allergies as of 02/25/2023   No Known Allergies      Medication List     STOP taking these medications    aspirin EC 81 MG tablet   Blood Pressure Monitor Misc   flintstones complete 60 MG chewable tablet       TAKE these medications    furosemide 20 MG tablet Commonly known as: LASIX Take 1 tablet (20 mg total) by mouth daily.   ibuprofen 600 MG tablet Commonly known as:  ADVIL Take 1 tablet (600 mg total) by mouth every 6 (six) hours as needed for mild pain, moderate pain or cramping.         Discharge home in stable condition Infant Feeding: Bottle Infant Disposition:home with mother Discharge instruction: per After Visit Summary and Postpartum booklet. Activity: Advance as tolerated. Pelvic rest for 6 weeks.  Diet: routine diet Future Appointments: Future Appointments  Date Time Provider Department Center  03/03/2023 10:10 AM [facility] NURSE [facility]   Follow up Visit:  Follow-up Information     [facility] at [facility] Follow up.   Specialty: Obstetrics and Gynecology Why: 1wk for bp check, then, 4-6 weeks for your postpartum visit Contact information: [address] [place] [address] [phone]                The following message was sent to FT.  Please schedule this patient for a In person postpartum visit in 6 weeks with the following provider: Any provider. Additional Postpartum F/U:BP check 1 week  High risk pregnancy complicated by: HTN Delivery mode:  Vaginal, Spontaneous Anticipated Birth Control:   phexxi  Now planning Nexplanon at pp visit 02/25/2023 Cheral Marker, CNM

## 2023-02-24 NOTE — Anesthesia Preprocedure Evaluation (Signed)
Anesthesia Evaluation  Patient identified by MRN, date of birth, ID band Patient awake    Reviewed: Allergy & Precautions, NPO status , Patient's Chart, lab work & pertinent test results  Airway Mallampati: II  TM Distance: >3 FB Neck ROM: Full    Dental  (+) Dental Advisory Given, Poor Dentition   Pulmonary neg pulmonary ROS, former smoker   Pulmonary exam normal breath sounds clear to auscultation       Cardiovascular hypertension (GHTN), Normal cardiovascular exam Rhythm:Regular Rate:Normal     Neuro/Psych negative neurological ROS     GI/Hepatic negative GI ROS, Neg liver ROS,,,  Endo/Other  Obesity   Renal/GU negative Renal ROS     Musculoskeletal negative musculoskeletal ROS (+)    Abdominal   Peds  Hematology  (+) Blood dyscrasia, anemia Plt 181k   Anesthesia Other Findings Day of surgery medications reviewed with the patient.  Reproductive/Obstetrics (+) Pregnancy                              Anesthesia Physical Anesthesia Plan  ASA: 3  Anesthesia Plan: Epidural   Post-op Pain Management:    Induction:   PONV Risk Score and Plan: 2 and Treatment may vary due to age or medical condition  Airway Management Planned: Natural Airway  Additional Equipment:   Intra-op Plan:   Post-operative Plan:   Informed Consent: I have reviewed the patients History and Physical, chart, labs and discussed the procedure including the risks, benefits and alternatives for the proposed anesthesia with the patient or authorized representative who has indicated his/her understanding and acceptance.     Dental advisory given  Plan Discussed with:   Anesthesia Plan Comments: (Patient identified. Risks/Benefits/Options discussed with patient including but not limited to bleeding, infection, nerve damage, paralysis, failed block, incomplete pain control, headache, blood pressure changes,  nausea, vomiting, reactions to medication both or allergic, itching and postpartum back pain. Confirmed with bedside nurse the patient's most recent platelet count. Confirmed with patient that they are not currently taking any anticoagulation, have any bleeding history or any family history of bleeding disorders. Patient expressed understanding and wished to proceed. All questions were answered. )         Anesthesia Quick Evaluation

## 2023-02-24 NOTE — Anesthesia Procedure Notes (Signed)
Epidural Patient location during procedure: OB Start time: 02/24/2023 5:17 AM End time: 02/24/2023 5:24 AM  Staffing Anesthesiologist: Collene Schlichter, MD Performed: anesthesiologist   Preanesthetic Checklist Completed: patient identified, IV checked, risks and benefits discussed, monitors and equipment checked, pre-op evaluation and timeout performed  Epidural Patient position: sitting Prep: DuraPrep Patient monitoring: blood pressure and continuous pulse ox Approach: midline Location: L3-L4 Injection technique: LOR air  Needle:  Needle type: Tuohy  Needle gauge: 17 G Needle length: 9 cm Needle insertion depth: 5 cm Catheter size: 19 Gauge Catheter at skin depth: 10 cm Test dose: negative and Other (1% Lidocaine)  Additional Notes Patient identified.  Risk benefits discussed including failed block, incomplete pain control, headache, nerve damage, paralysis, blood pressure changes, nausea, vomiting, reactions to medication both toxic or allergic, and postpartum back pain.  Patient expressed understanding and wished to proceed.  All questions were answered.  Sterile technique used throughout procedure and epidural site dressed with sterile barrier dressing. No paresthesia or other complications noted. The patient did not experience any signs of intravascular injection such as tinnitus or metallic taste in mouth nor signs of intrathecal spread such as rapid motor block. Please see nursing notes for vital signs. Reason for block:procedure for pain

## 2023-02-24 NOTE — Anesthesia Postprocedure Evaluation (Signed)
Anesthesia Post Note  Patient: Carla Rose  Procedure(s) Performed: AN AD HOC LABOR EPIDURAL     Patient location during evaluation: Mother Baby Anesthesia Type: Epidural Level of consciousness: awake and alert and oriented Pain management: satisfactory to patient Vital Signs Assessment: post-procedure vital signs reviewed and stable Respiratory status: respiratory function stable Cardiovascular status: stable Postop Assessment: no headache, no backache, epidural receding, patient able to bend at knees, no signs of nausea or vomiting, adequate PO intake and able to ambulate Anesthetic complications: no   No notable events documented.  Last Vitals:  Vitals:   02/24/23 1020 02/24/23 1531  BP: 115/77 127/88  Pulse: 60 61  Resp: 16 16  Temp: 36.7 C 36.7 C  SpO2:      Last Pain:  Vitals:   02/24/23 1531  TempSrc: Oral  PainSc:    Pain Goal:                   Lyman Balingit

## 2023-02-24 NOTE — Lactation Note (Signed)
This note was copied from a baby's chart. Lactation Consultation Note  Patient Name: Carla Rose [id number] Date: 02/24/2023 Age:26 hours  P4, ETI female infant. Per Birth Parent, she only wanted infant to have colostrum in L&D, now she is currently formula feed infant only.    Maternal Data    Feeding    LATCH Score                    Lactation Tools Discussed/Used    Interventions    Discharge    Consult Status      Frederico Hamman 02/24/2023, 4:33 PM

## 2023-02-25 ENCOUNTER — Other Ambulatory Visit (HOSPITAL_COMMUNITY): Payer: Self-pay

## 2023-02-25 MED ORDER — IBUPROFEN 600 MG PO TABS
600.0000 mg | ORAL_TABLET | Freq: Four times a day (QID) | ORAL | 0 refills | Status: DC | PRN
  Filled 2023-02-25: qty 30, 8d supply, fill #0

## 2023-02-25 MED ORDER — FUROSEMIDE 20 MG PO TABS
20.0000 mg | ORAL_TABLET | Freq: Every day | ORAL | 0 refills | Status: AC
  Filled 2023-02-25: qty 3, 3d supply, fill #0

## 2023-02-25 NOTE — Discharge Instructions (Signed)
Call the office ([phone]) [facility] for these signs of pre-eclampsia:  Severe headache that does not go away with Tylenol  Visual changes- seeing spots, double, blurred vision  Pain under your right breast or upper abdomen that does not go away with Tums or heartburn medicine  Nausea and/or vomiting  Severe swelling in your hands, feet, and face       

## 2023-03-03 ENCOUNTER — Encounter: Payer: BC Managed Care – PPO | Admitting: Women's Health

## 2023-03-03 ENCOUNTER — Ambulatory Visit: Payer: BC Managed Care – PPO

## 2023-03-04 ENCOUNTER — Other Ambulatory Visit: Payer: Self-pay | Admitting: Women's Health

## 2023-03-07 ENCOUNTER — Other Ambulatory Visit (HOSPITAL_COMMUNITY): Payer: Self-pay

## 2023-03-08 ENCOUNTER — Encounter: Payer: Self-pay | Admitting: Women's Health

## 2023-03-23 ENCOUNTER — Telehealth (HOSPITAL_COMMUNITY): Payer: Self-pay

## 2023-03-23 NOTE — Telephone Encounter (Signed)
03/23/2023  Name: Carla Rose MRN: [id number] DOB: 1995/12/23  Reason for Call:  Transition of Care Hospital Discharge Call  Contact Status: Patient Contact Status: Message  Language assistant needed: Interpreter Mode: Interpreter Not Needed        Follow-Up Questions:    Inocente Salles Postnatal Depression Scale:  In the Past 7 Days:    PHQ2-9 Depression Scale:     Discharge Follow-up:    Post-discharge interventions: NA  Signature  Signe Colt

## 2023-03-25 ENCOUNTER — Encounter: Payer: Self-pay | Admitting: Women's Health

## 2023-04-08 ENCOUNTER — Ambulatory Visit: Payer: BC Managed Care – PPO | Admitting: Advanced Practice Midwife

## 2023-04-08 ENCOUNTER — Ambulatory Visit: Payer: BC Managed Care – PPO | Admitting: Women's Health

## 2023-04-27 ENCOUNTER — Ambulatory Visit
Admission: EM | Admit: 2023-04-27 | Discharge: 2023-04-27 | Disposition: A | Discharge status: Home / Self Care | Payer: BC Managed Care – PPO

## 2023-04-27 DIAGNOSIS — M79604 Pain in right leg: Secondary | ICD-10-CM

## 2023-04-27 DIAGNOSIS — M79605 Pain in left leg: Secondary | ICD-10-CM | POA: Diagnosis not present

## 2023-04-27 NOTE — Discharge Instructions (Signed)
You may call [facility] at [phone] tomorrow morning to set up your ultrasounds.  Elevate your legs at rest, make sure to be up and moving often throughout the day, wear compression stockings. Follow up with Primary Care for recheck

## 2023-04-27 NOTE — ED Triage Notes (Signed)
Pt c/o bilateral leg numbness and feet cold x 2 days, tingling feeling in both legs and feet.

## 2023-04-27 NOTE — ED Provider Notes (Signed)
[facility]    CSN: [id number] Arrival date & time: 04/27/23  1550      History   Chief Complaint No chief complaint on file.   HPI Carla Rose is a 27 y.o. female.   Patient presenting today with 2-day history of bilateral leg pain, numbness and tingling, feeling cold and states last night her feet were a bit purple.  She denies injury, chest pain, shortness of breath, palpitations, dizziness, new medications or supplements.  Is 2 months postpartum and states she has had swelling in her legs ever since.  Does note that she has been very sedentary postpartum.  Wore compression stockings yesterday which may have helped a bit.  States her mom is worried about a blood clot.    Past Medical History:  Diagnosis Date   Anemia    Chlamydia    Gonorrhea    Hypertension    Pregnancy induced hypertension     Patient Active Problem List   Diagnosis Date Noted   Vaginal delivery 02/24/2023   Gestational hypertension 02/23/2023   Supervision of normal pregnancy 08/18/2022   History of ectopic pregnancy 08/18/2022   Abnormal Pap smear of cervix 11/05/2021   History of gestational hypertension & PPHTN 02/25/2018    Past Surgical History:  Procedure Laterality Date   LAPAROSCOPIC UNILATERAL SALPINGECTOMY Left 01/30/2022   Procedure: LEFT LAPAROSCOPIC SALPINGECTOMY WITH REMOVAL OF ECTOPIC PREGNANCY;  Surgeon: Adam Phenix, MD;  Location: [facility] OR;  Service: Gynecology;  Laterality: Left;   NO PAST SURGERIES      OB History     Gravida  5   Para  4   Term  4   Preterm      AB  1   Living  4      SAB      IAB      Ectopic  1   Multiple  0   Live Births  4            Home Medications    Prior to Admission medications   Medication Sig Start Date End Date Taking? Authorizing Provider  furosemide (LASIX) 20 MG tablet Take 1 tablet (20 mg total) by mouth daily. 02/25/23   Cheral Marker, CNM  ibuprofen (ADVIL) 600 MG tablet Take 1 tablet  (600 mg total) by mouth every 6 (six) hours as needed for mild pain, moderate pain or cramping. 02/25/23   Cheral Marker, CNM  hydrochlorothiazide (MICROZIDE) 12.5 MG capsule Take 1 capsule (12.5 mg total) by mouth daily. Patient not taking: Reported on 08/24/2018 07/27/18 11/14/20  Tilda Burrow, MD  lisinopril (ZESTRIL) 10 MG tablet Take 1 tablet (10 mg total) by mouth daily. Patient not taking: Reported on 08/24/2018 07/27/18 11/14/20  Tilda Burrow, MD    Family History Family History  Problem Relation Age of Onset   Crohn's disease Father    Crohn's disease Sister    Heart disease Paternal Grandmother     Social History Social History   Tobacco Use   Smoking status: Former    Current packs/day: 5.00    Types: Cigars, Cigarettes   Smokeless tobacco: Never  Vaping Use   Vaping status: Never Used  Substance Use Topics   Alcohol use: No   Drug use: No     Allergies   Patient has no known allergies.   Review of Systems Review of Systems Per HPI  Physical Exam Triage Vital Signs ED Triage Vitals [04/27/23 1613]  Encounter  Vitals Group     BP (!) 147/87     Systolic BP Percentile      Diastolic BP Percentile      Pulse Rate 86     Resp 15     Temp 98.5 F (36.9 C)     Temp Source Oral     SpO2 97 %     Weight      Height      Head Circumference      Peak Flow      Pain Score 5     Pain Loc      Pain Education      Exclude from Growth Chart    No data found.  Updated Vital Signs BP (!) 147/87 (BP Location: Right Arm)   Pulse 86   Temp 98.5 F (36.9 C) (Oral)   Resp 15   LMP 04/20/2023 (Approximate)   SpO2 97%   Breastfeeding No   Visual Acuity Right Eye Distance:   Left Eye Distance:   Bilateral Distance:    Right Eye Near:   Left Eye Near:    Bilateral Near:     Physical Exam Vitals and nursing note reviewed.  Constitutional:      Appearance: Normal appearance. She is not ill-appearing.  HENT:     Head: Atraumatic.  Eyes:      Extraocular Movements: Extraocular movements intact.     Conjunctiva/sclera: Conjunctivae normal.  Cardiovascular:     Rate and Rhythm: Normal rate and regular rhythm.     Heart sounds: Normal heart sounds.  Pulmonary:     Effort: Pulmonary effort is normal.     Breath sounds: Normal breath sounds.  Musculoskeletal:        General: Tenderness present. No swelling or signs of injury. Normal range of motion.     Cervical back: Normal range of motion and neck supple.     Comments: Mild diffuse tenderness to palpation to bilateral lower legs  Skin:    General: Skin is warm and dry.     Findings: No bruising or erythema.  Neurological:     Mental Status: She is alert and oriented to person, place, and time.     Motor: No weakness.     Gait: Gait normal.     Comments: Bilateral lower extremities neurovascularly intact  Psychiatric:        Mood and Affect: Mood normal.        Thought Content: Thought content normal.        Judgment: Judgment normal.      UC Treatments / Results  Labs (all labs ordered are listed, but only abnormal results are displayed) Labs Reviewed - No data to display  EKG   Radiology No results found.  Procedures Procedures (including critical care time)  Medications Ordered in UC Medications - No data to display  Initial Impression / Assessment and Plan / UC Course  I have reviewed the triage vital signs and the nursing notes.  Pertinent labs & imaging results that were available during my care of the patient were reviewed by me and considered in my medical decision making (see chart for details).     Minimally hypertensive in triage, otherwise vital signs within normal limits and she appears well and in no acute distress.  No obvious abnormalities on exam, however given her concern for DVT ultrasound ordered and information given to call in the morning to set up this appointment outpatient.  Very low suspicion for this  at this time.  Suspect  related to sedentary postpartum period, hormone changes and what sounds to be a bit of Raynaud's additionally.  Discussed supportive over-the-counter medications, home care and return precautions.  She is seeing her primary care provider tomorrow for another issue and plans to bring this up with them as well.  ED for worsening symptoms at any time. Final Clinical Impressions(s) / UC Diagnoses   Final diagnoses:  Bilateral leg pain     Discharge Instructions      You may call [facility] at [phone] tomorrow morning to set up your ultrasounds.  Elevate your legs at rest, make sure to be up and moving often throughout the day, wear compression stockings. Follow up with Primary Care for recheck    ED Prescriptions   None    PDMP not reviewed this encounter.   Particia Nearing, [place] 04/27/23 1725

## 2023-04-28 ENCOUNTER — Ambulatory Visit (INDEPENDENT_AMBULATORY_CARE_PROVIDER_SITE_OTHER): Payer: BC Managed Care – PPO | Admitting: Family Medicine

## 2023-04-28 ENCOUNTER — Encounter: Payer: Self-pay | Admitting: Family Medicine

## 2023-04-28 VITALS — BP 122/83 | HR 83 | Ht 68.0 in | Wt 214.0 lb

## 2023-04-28 DIAGNOSIS — Z13220 Encounter for screening for lipoid disorders: Secondary | ICD-10-CM

## 2023-04-28 DIAGNOSIS — F53 Postpartum depression: Secondary | ICD-10-CM

## 2023-04-28 DIAGNOSIS — F32A Depression, unspecified: Secondary | ICD-10-CM

## 2023-04-28 DIAGNOSIS — R0602 Shortness of breath: Secondary | ICD-10-CM

## 2023-04-28 DIAGNOSIS — Z23 Encounter for immunization: Secondary | ICD-10-CM

## 2023-04-28 DIAGNOSIS — E559 Vitamin D deficiency, unspecified: Secondary | ICD-10-CM | POA: Diagnosis not present

## 2023-04-28 DIAGNOSIS — D509 Iron deficiency anemia, unspecified: Secondary | ICD-10-CM | POA: Diagnosis not present

## 2023-04-28 DIAGNOSIS — Z114 Encounter for screening for human immunodeficiency virus [HIV]: Secondary | ICD-10-CM

## 2023-04-28 DIAGNOSIS — Z1329 Encounter for screening for other suspected endocrine disorder: Secondary | ICD-10-CM

## 2023-04-28 DIAGNOSIS — T7840XA Allergy, unspecified, initial encounter: Secondary | ICD-10-CM

## 2023-04-28 DIAGNOSIS — Z1159 Encounter for screening for other viral diseases: Secondary | ICD-10-CM

## 2023-04-28 DIAGNOSIS — F419 Anxiety disorder, unspecified: Secondary | ICD-10-CM

## 2023-04-28 DIAGNOSIS — M7989 Other specified soft tissue disorders: Secondary | ICD-10-CM

## 2023-04-28 DIAGNOSIS — Z131 Encounter for screening for diabetes mellitus: Secondary | ICD-10-CM

## 2023-04-28 MED ORDER — SERTRALINE HCL 25 MG PO TABS
25.0000 mg | ORAL_TABLET | Freq: Every day | ORAL | 2 refills | Status: AC
Start: 2023-04-28 — End: ?

## 2023-04-28 MED ORDER — ALBUTEROL SULFATE HFA 108 (90 BASE) MCG/ACT IN AERS
2.0000 | INHALATION_SPRAY | Freq: Four times a day (QID) | RESPIRATORY_TRACT | 2 refills | Status: AC | PRN
Start: 2023-04-28 — End: ?

## 2023-04-28 MED ORDER — HYDROXYZINE HCL 25 MG PO TABS: 25.0000 mg | ORAL_TABLET | Freq: Three times a day (TID) | ORAL | 3 refills | Status: AC | PRN

## 2023-04-28 NOTE — Progress Notes (Unsigned)
New Patient Office Visit   Subjective   Patient ID: Carla Rose, female    DOB: Apr 05, 1996  Age: 27 y.o. MRN: [id number]  CC:  Chief Complaint  Patient presents with   Establish Care    Patient is here to establish care. Has concerns about anxiety/PPD since having her baby on July 30. Complains of bilateral leg swelling, legs tingling and cold. Went to urgent care and is supposed to get an [place] of leg.     HPI Carla Rose dvt presents to establish care. She  has a past medical history of Anemia, Chlamydia, Gonorrhea, Hypertension, and Pregnancy induced hypertension.  Depression      The patient presents with depression.  This is a recurrent problem.  The problem occurs constantly.  The problem has been gradually worsening since onset.  Associated symptoms include fatigue, insomnia, irritable, restlessness, decreased interest and headaches.  Associated symptoms include no myalgias and no suicidal ideas.     The symptoms are aggravated by family issues and work stress.  Past treatments include nothing.  Risk factors include family history of mental illness, stress, prior traumatic experience and family history.   Past medical history includes anxiety and depression.     Pertinent negatives include no suicide attempts.  DVT   Outpatient Encounter Medications as of 04/28/2023  Medication Sig   furosemide (LASIX) 20 MG tablet Take 1 tablet (20 mg total) by mouth daily. (Patient not taking: Reported on 04/28/2023)   ibuprofen (ADVIL) 600 MG tablet Take 1 tablet (600 mg total) by mouth every 6 (six) hours as needed for mild pain, moderate pain or cramping. (Patient not taking: Reported on 04/28/2023)   [DISCONTINUED] hydrochlorothiazide (MICROZIDE) 12.5 MG capsule Take 1 capsule (12.5 mg total) by mouth daily. (Patient not taking: Reported on 08/24/2018)   [DISCONTINUED] lisinopril (ZESTRIL) 10 MG tablet Take 1 tablet (10 mg total) by mouth daily. (Patient not taking: Reported on 08/24/2018)    No facility-administered encounter medications on file as of 04/28/2023.    Past Surgical History:  Procedure Laterality Date   ECTOPIC PREGNANCY SURGERY     LAPAROSCOPIC UNILATERAL SALPINGECTOMY Left 01/30/2022   Procedure: LEFT LAPAROSCOPIC SALPINGECTOMY WITH REMOVAL OF ECTOPIC PREGNANCY;  Surgeon: Adam Phenix, MD;  Location: [facility] OR;  Service: Gynecology;  Laterality: Left;   NO PAST SURGERIES      Review of Systems  Constitutional:  Positive for fatigue. Negative for chills and fever.  Respiratory:  Positive for shortness of breath.   Cardiovascular:  Positive for chest pain.  Gastrointestinal:  Negative for abdominal pain.  Genitourinary:  Negative for dysuria.  Musculoskeletal:  Negative for myalgias.  Neurological:  Positive for headaches.  Psychiatric/Behavioral:  Positive for depression. Negative for suicidal ideas. The patient is nervous/anxious and has insomnia.       Objective    BP 122/83   Pulse 83   Ht 5\' 8"  (1.727 m)   Wt 214 lb (97.1 kg)   LMP 04/20/2023 (Approximate)   SpO2 97%   BMI 32.54 kg/m   Physical Exam Constitutional:      General: She is irritable.       Assessment & Plan:  Need for influenza vaccination  Encounter for immunization -     Flu vaccine trivalent PF, 6mos and older(Flulaval,Afluria,Fluarix,Fluzone)    No follow-ups on file.   Cruzita Lederer Newman Nip, FNP

## 2023-04-28 NOTE — Patient Instructions (Signed)

## 2023-04-29 DIAGNOSIS — F32A Depression, unspecified: Secondary | ICD-10-CM | POA: Insufficient documentation

## 2023-04-29 NOTE — Assessment & Plan Note (Signed)
Flowsheet Row Office Visit from 04/28/2023 in [facility]  PHQ-9 Total Score 17          04/28/2023    9:24 AM 08/18/2022    4:12 PM 10/28/2021   11:10 AM 10/10/2021    2:13 PM  GAD 7 : Generalized Anxiety Score  Nervous, Anxious, on Edge 3 2 0 0  Control/stop worrying 3 2 0 0  Worry too much - different things 3 2 0 0  Trouble relaxing 3 2 0 0  Restless 0 0 0 0  Easily annoyed or irritable 1 0 0 0  Afraid - awful might happen 2 0 0 0  Total GAD 7 Score 15 8 0 0  Anxiety Difficulty Somewhat difficult      Postpartum Depression Trial on Zoloft 25 mg and Hydroxyzine 25 mg PRN Discussed about cognitive behavioral therapy focusing on thoughts, belief, and attitudes that affects feelings and behavior, learning about coping skills to deal with certain problems. Maintaining a consistent routine and schedule, Practice stress management and self calming techniques, excersise regularly and spend time outdoors, Do not eat food that are high in fat, added sugar, or salt. Follow up in 6 weeks Referral to behavior health    Patient verbally consented to [facility] services about presenting concerns and psychiatric consultation as appropriate. The services will be billed as appropriate for the patient.

## 2023-05-01 DIAGNOSIS — D509 Iron deficiency anemia, unspecified: Secondary | ICD-10-CM | POA: Diagnosis not present

## 2023-05-01 DIAGNOSIS — E538 Deficiency of other specified B group vitamins: Secondary | ICD-10-CM | POA: Diagnosis not present

## 2023-05-01 DIAGNOSIS — Z1159 Encounter for screening for other viral diseases: Secondary | ICD-10-CM | POA: Diagnosis not present

## 2023-05-01 DIAGNOSIS — Z13220 Encounter for screening for lipoid disorders: Secondary | ICD-10-CM | POA: Diagnosis not present

## 2023-05-01 DIAGNOSIS — Z114 Encounter for screening for human immunodeficiency virus [HIV]: Secondary | ICD-10-CM | POA: Diagnosis not present

## 2023-05-01 DIAGNOSIS — E559 Vitamin D deficiency, unspecified: Secondary | ICD-10-CM | POA: Diagnosis not present

## 2023-05-01 DIAGNOSIS — Z131 Encounter for screening for diabetes mellitus: Secondary | ICD-10-CM | POA: Diagnosis not present

## 2023-05-02 LAB — CBC WITH DIFFERENTIAL/PLATELET
Basophils Absolute: 0 10*3/uL (ref 0.0–0.2)
Basos: 1 %
EOS (ABSOLUTE): 0.2 10*3/uL (ref 0.0–0.4)
Eos: 2 %
Hematocrit: 38.4 % (ref 34.0–46.6)
Hemoglobin: 12.5 g/dL (ref 11.1–15.9)
Immature Grans (Abs): 0 10*3/uL (ref 0.0–0.1)
Immature Granulocytes: 0 %
Lymphocytes Absolute: 2 10*3/uL (ref 0.7–3.1)
Lymphs: 29 %
MCH: 28.5 pg (ref 26.6–33.0)
MCHC: 32.6 g/dL (ref 31.5–35.7)
MCV: 88 fL (ref 79–97)
Monocytes Absolute: 0.5 10*3/uL (ref 0.1–0.9)
Monocytes: 8 %
Neutrophils Absolute: 4.2 10*3/uL (ref 1.4–7.0)
Neutrophils: 60 %
Platelets: 253 10*3/uL (ref 150–450)
RBC: 4.38 x10E6/uL (ref 3.77–5.28)
RDW: 13.9 % (ref 11.7–15.4)
WBC: 7 10*3/uL (ref 3.4–10.8)

## 2023-05-02 LAB — LIPID PANEL
Chol/HDL Ratio: 4.6 {ratio} — ABNORMAL HIGH (ref 0.0–4.4)
Cholesterol, Total: 166 mg/dL (ref 100–199)
HDL: 36 mg/dL — ABNORMAL LOW (ref >39)
LDL Chol Calc (NIH): 104 mg/dL — ABNORMAL HIGH (ref 0–99)
Triglycerides: 143 mg/dL (ref 0–149)
VLDL Cholesterol Cal: 26 mg/dL (ref 5–40)

## 2023-05-02 LAB — CMP14+EGFR
ALT: 35 [IU]/L — ABNORMAL HIGH (ref 0–32)
AST: 24 [IU]/L (ref 0–40)
Albumin: 4.4 g/dL (ref 4.0–5.0)
Alkaline Phosphatase: 136 [IU]/L — ABNORMAL HIGH (ref 44–121)
BUN/Creatinine Ratio: 21 (ref 9–23)
BUN: 15 mg/dL (ref 6–20)
Bilirubin Total: 0.3 mg/dL (ref 0.0–1.2)
CO2: 22 mmol/L (ref 20–29)
Calcium: 9.3 mg/dL (ref 8.7–10.2)
Chloride: 101 mmol/L (ref 96–106)
Creatinine, Ser: 0.72 mg/dL (ref 0.57–1.00)
Globulin, Total: 3 g/dL (ref 1.5–4.5)
Glucose: 103 mg/dL — ABNORMAL HIGH (ref 70–99)
Potassium: 4.4 mmol/L (ref 3.5–5.2)
Sodium: 139 mmol/L (ref 134–144)
Total Protein: 7.4 g/dL (ref 6.0–8.5)
eGFR: 118 mL/min/{1.73_m2} (ref >59)

## 2023-05-02 LAB — B12 AND FOLATE PANEL
Folate: 8.5 ng/mL (ref >3.0)
Vitamin B-12: 685 pg/mL (ref 232–1245)

## 2023-05-02 LAB — IRON,TIBC AND FERRITIN PANEL
Ferritin: 62 ng/mL (ref 15–150)
Iron Saturation: 17 % (ref 15–55)
Iron: 60 ug/dL (ref 27–159)
Total Iron Binding Capacity: 355 ug/dL (ref 250–450)
UIBC: 295 ug/dL (ref 131–425)

## 2023-05-02 LAB — TSH+FREE T4
Free T4: 1.28 ng/dL (ref 0.82–1.77)
TSH: 4.66 u[IU]/mL — ABNORMAL HIGH (ref 0.450–4.500)

## 2023-05-02 LAB — VITAMIN D 25 HYDROXY (VIT D DEFICIENCY, FRACTURES): Vit D, 25-Hydroxy: 38.1 ng/mL (ref 30.0–100.0)

## 2023-05-02 LAB — HEMOGLOBIN A1C
Est. average glucose Bld gHb Est-mCnc: 105 mg/dL
Hgb A1c MFr Bld: 5.3 % (ref 4.8–5.6)

## 2023-05-02 LAB — HIV ANTIBODY (ROUTINE TESTING W REFLEX): HIV Screen 4th Generation wRfx: NONREACTIVE

## 2023-05-02 LAB — HEPATITIS C ANTIBODY: Hep C Virus Ab: NONREACTIVE

## 2023-05-05 ENCOUNTER — Ambulatory Visit (HOSPITAL_COMMUNITY): Payer: BC Managed Care – PPO | Attending: Family Medicine

## 2023-06-08 NOTE — Patient Instructions (Signed)

## 2023-06-08 NOTE — Progress Notes (Unsigned)
   Established Patient Office Visit   Subjective  Patient ID: Carla Rose, female    DOB: [birth year]  Age: 27 y.o. MRN: [id number]  No chief complaint on file.   She  has a past medical history of Anemia, Chlamydia, Gonorrhea, Hypertension, and Pregnancy induced hypertension.  HPI  ROS    Objective:     There were no vitals taken for this visit. {Vitals History (Optional):23777}  Physical Exam   No results found for any visits on 06/09/23.  The ASCVD Risk score (Arnett DK, et al., 2019) failed to calculate for the following reasons:   The 2019 ASCVD risk score is only valid for ages 36 to 64    Assessment & Plan:  There are no diagnoses linked to this encounter.  No follow-ups on file.   Cruzita Lederer Newman Nip, FNP

## 2023-06-09 ENCOUNTER — Ambulatory Visit: Payer: BC Managed Care – PPO | Admitting: Family Medicine

## 2023-06-12 ENCOUNTER — Encounter: Payer: Self-pay | Admitting: Family Medicine

## 2023-06-16 ENCOUNTER — Other Ambulatory Visit (HOSPITAL_COMMUNITY): Payer: Self-pay

## 2024-05-19 ENCOUNTER — Encounter: Payer: Self-pay | Admitting: Emergency Medicine

## 2024-05-19 ENCOUNTER — Other Ambulatory Visit: Payer: Self-pay

## 2024-05-19 ENCOUNTER — Ambulatory Visit
Admission: EM | Admit: 2024-05-19 | Discharge: 2024-05-19 | Disposition: A | Discharge status: Home / Self Care | Payer: Self-pay | Attending: Family Medicine | Admitting: Family Medicine

## 2024-05-19 DIAGNOSIS — J069 Acute upper respiratory infection, unspecified: Secondary | ICD-10-CM

## 2024-05-19 MED ORDER — FLUTICASONE PROPIONATE 50 MCG/ACT NA SUSP: 1.0000 | Freq: Two times a day (BID) | NASAL | 2 refills | Status: AC

## 2024-05-19 MED ORDER — PROMETHAZINE-DM 6.25-15 MG/5ML PO SYRP: 5.0000 mL | ORAL_SOLUTION | Freq: Four times a day (QID) | ORAL | 0 refills | Status: AC | PRN

## 2024-05-19 NOTE — ED Triage Notes (Signed)
 Pt reports cough, runny nose x3 days.

## 2024-05-19 NOTE — ED Provider Notes (Signed)
 [facility]    CSN: [id number] Arrival date & time: 05/19/24  1134      History   Chief Complaint Chief Complaint  Patient presents with   Cough    HPI Carla Rose is a 28 y.o. female.   Patient presenting today with 3-day history of cough, runny nose.  Denies fever, chills, chest pain, shortness of breath, abdominal pain, vomiting, diarrhea.  So far not trying anything over-the-counter for symptoms.  Children all sick with similar symptoms.    Past Medical History:  Diagnosis Date   Anemia    Chlamydia    Gonorrhea    Hypertension    Pregnancy induced hypertension     Patient Active Problem List   Diagnosis Date Noted   Anxiety and depression 04/29/2023   Vaginal delivery 02/24/2023   Gestational hypertension 02/23/2023   Supervision of normal pregnancy 08/18/2022   History of ectopic pregnancy 08/18/2022   Abnormal Pap smear of cervix 11/05/2021   History of gestational hypertension & PPHTN 02/25/2018    Past Surgical History:  Procedure Laterality Date   ECTOPIC PREGNANCY SURGERY     LAPAROSCOPIC UNILATERAL SALPINGECTOMY Left 01/30/2022   Procedure: LEFT LAPAROSCOPIC SALPINGECTOMY WITH REMOVAL OF ECTOPIC PREGNANCY;  Surgeon: Eveline Lynwood MATSU, MD;  Location: [facility] OR;  Service: Gynecology;  Laterality: Left;   NO PAST SURGERIES      OB History     Gravida  5   Para  4   Term  4   Preterm      AB  1   Living  4      SAB      IAB      Ectopic  1   Multiple  0   Live Births  4            Home Medications    Prior to Admission medications   Medication Sig Start Date End Date Taking? Authorizing Provider  fluticasone  (FLONASE ) 50 MCG/ACT nasal spray Place 1 spray into both nostrils 2 (two) times daily. 05/19/24  Yes Stuart Vernell Norris, PA-C  promethazine-dextromethorphan (PROMETHAZINE-DM) 6.25-15 MG/5ML syrup Take 5 mLs by mouth 4 (four) times daily as needed. 05/19/24  Yes Stuart Vernell Norris, PA-C  albuterol   (VENTOLIN  HFA) 108 [phone] Base) MCG/ACT inhaler Inhale 2 puffs into the lungs every 6 (six) hours as needed for wheezing or shortness of breath. 04/28/23   Del Orbe Polanco, Iliana, FNP  furosemide  (LASIX ) 20 MG tablet Take 1 tablet (20 mg total) by mouth daily. Patient not taking: Reported on 04/28/2023 02/25/23   Kizzie Suzen SAUNDERS, CNM  hydrOXYzine  (ATARAX ) 25 MG tablet Take 1 tablet (25 mg total) by mouth 3 (three) times daily as needed. 04/28/23   Del Orbe Polanco, Iliana, FNP  sertraline  (ZOLOFT ) 25 MG tablet Take 1 tablet (25 mg total) by mouth daily. 04/28/23   Del Orbe Polanco, Iliana, FNP  hydrochlorothiazide  (MICROZIDE ) 12.5 MG capsule Take 1 capsule (12.5 mg total) by mouth daily. Patient not taking: Reported on 08/24/2018 07/27/18 11/14/20  Edsel Norleen GAILS, MD  lisinopril  (ZESTRIL ) 10 MG tablet Take 1 tablet (10 mg total) by mouth daily. Patient not taking: Reported on 08/24/2018 07/27/18 11/14/20  Edsel Norleen GAILS, MD    Family History Family History  Problem Relation Age of Onset   Crohn's disease Father    Crohn's disease Sister    Heart disease Paternal Grandmother     Social History Social History   Tobacco Use   Smoking status:  Never   Smokeless tobacco: Never  Vaping Use   Vaping status: Never Used  Substance Use Topics   Alcohol use: No   Drug use: No     Allergies   Patient has no known allergies.   Review of Systems Review of Systems PER HPI  Physical Exam Triage Vital Signs ED Triage Vitals  Encounter Vitals Group     BP 05/19/24 1239 115/77     Girls Systolic BP Percentile --      Girls Diastolic BP Percentile --      Boys Systolic BP Percentile --      Boys Diastolic BP Percentile --      Pulse Rate 05/19/24 1239 71     Resp 05/19/24 1239 18     Temp 05/19/24 1239 98.4 F (36.9 C)     Temp Source 05/19/24 1239 Oral     SpO2 05/19/24 1239 98 %     Weight --      Height --      Head Circumference --      Peak Flow --      Pain Score 05/19/24  1228 2     Pain Loc --      Pain Education --      Exclude from Growth Chart --    No data found.  Updated Vital Signs BP 115/77 (BP Location: Right Arm)   Pulse 71   Temp 98.4 F (36.9 C) (Oral)   Resp 18   LMP 05/08/2024 (Approximate)   SpO2 98%   Breastfeeding No   Visual Acuity Right Eye Distance:   Left Eye Distance:   Bilateral Distance:    Right Eye Near:   Left Eye Near:    Bilateral Near:     Physical Exam Vitals and nursing note reviewed.  Constitutional:      Appearance: Normal appearance.  HENT:     Head: Atraumatic.     Right Ear: Tympanic membrane and external ear normal.     Left Ear: Tympanic membrane and external ear normal.     Nose: Rhinorrhea present.     Mouth/Throat:     Mouth: Mucous membranes are moist.     Pharynx: Posterior oropharyngeal erythema present.  Eyes:     Extraocular Movements: Extraocular movements intact.     Conjunctiva/sclera: Conjunctivae normal.  Cardiovascular:     Rate and Rhythm: Normal rate and regular rhythm.     Heart sounds: Normal heart sounds.  Pulmonary:     Effort: Pulmonary effort is normal.     Breath sounds: Normal breath sounds. No wheezing or rales.  Musculoskeletal:        General: Normal range of motion.     Cervical back: Normal range of motion and neck supple.  Skin:    General: Skin is warm and dry.  Neurological:     Mental Status: She is alert and oriented to person, place, and time.  Psychiatric:        Mood and Affect: Mood normal.        Thought Content: Thought content normal.      UC Treatments / Results  Labs (all labs ordered are listed, but only abnormal results are displayed) Labs Reviewed - No data to display  EKG   Radiology No results found.  Procedures Procedures (including critical care time)  Medications Ordered in UC Medications - No data to display  Initial Impression / Assessment and Plan / UC Course  I have reviewed the  triage vital signs and the nursing  notes.  Pertinent labs & imaging results that were available during my care of the patient were reviewed by me and considered in my medical decision making (see chart for details).     Vitals and exam reassuring today, suspect viral respiratory infection.  Treat with Phenergan DM, Flonase , supportive over-the-counter medications and home care.  Return for worsening or unresolving symptoms.  Final Clinical Impressions(s) / UC Diagnoses   Final diagnoses:  Viral URI with cough   Discharge Instructions   None    ED Prescriptions     Medication Sig Dispense Auth. Provider   promethazine-dextromethorphan (PROMETHAZINE-DM) 6.25-15 MG/5ML syrup Take 5 mLs by mouth 4 (four) times daily as needed. 100 mL Stuart Vernell Norris, PA-C   fluticasone  (FLONASE ) 50 MCG/ACT nasal spray Place 1 spray into both nostrils 2 (two) times daily. 16 g Stuart Vernell Norris, [place]      PDMP not reviewed this encounter.   Stuart Vernell Norris, PA-C 05/19/24 1434

## 2024-09-13 ENCOUNTER — Ambulatory Visit: Payer: Self-pay | Admitting: Women's Health
